# Patient Record
Sex: Male | Born: 1955 | Race: Black or African American | Hispanic: No | Marital: Married | Smoking: Never smoker
Health system: Southern US, Community
[De-identification: ages and names within clinical notes are randomized; demographics above are authoritative.]

## PROBLEM LIST (undated history)

## (undated) DIAGNOSIS — I161 Hypertensive emergency: Secondary | ICD-10-CM

## (undated) DIAGNOSIS — I10 Essential (primary) hypertension: Secondary | ICD-10-CM

## (undated) HISTORY — PX: TONSILLECTOMY: SUR1361

---

## 1999-04-27 ENCOUNTER — Encounter: Admission: RE | Admit: 1999-04-27 | Discharge: 1999-04-27 | Payer: Self-pay | Admitting: Internal Medicine

## 1999-04-27 ENCOUNTER — Encounter: Payer: Self-pay | Admitting: Internal Medicine

## 1999-06-09 ENCOUNTER — Encounter: Payer: Self-pay | Admitting: *Deleted

## 1999-06-09 ENCOUNTER — Ambulatory Visit (HOSPITAL_COMMUNITY): Admission: RE | Admit: 1999-06-09 | Discharge: 1999-06-09 | Payer: Self-pay | Admitting: *Deleted

## 1999-09-12 ENCOUNTER — Encounter: Admission: RE | Admit: 1999-09-12 | Discharge: 1999-12-11 | Payer: Self-pay | Admitting: Internal Medicine

## 2000-06-27 ENCOUNTER — Emergency Department (HOSPITAL_COMMUNITY): Admission: EM | Admit: 2000-06-27 | Discharge: 2000-06-28 | Payer: Self-pay | Admitting: Emergency Medicine

## 2000-06-28 ENCOUNTER — Encounter: Payer: Self-pay | Admitting: Emergency Medicine

## 2000-07-02 ENCOUNTER — Encounter: Admission: RE | Admit: 2000-07-02 | Discharge: 2000-09-30 | Payer: Self-pay | Admitting: Internal Medicine

## 2005-01-06 ENCOUNTER — Emergency Department (HOSPITAL_COMMUNITY): Admission: EM | Admit: 2005-01-06 | Discharge: 2005-01-06 | Payer: Self-pay | Admitting: Emergency Medicine

## 2007-11-07 ENCOUNTER — Emergency Department (HOSPITAL_COMMUNITY): Admission: EM | Admit: 2007-11-07 | Discharge: 2007-11-07 | Payer: Self-pay | Admitting: Emergency Medicine

## 2008-04-02 ENCOUNTER — Encounter: Payer: Self-pay | Admitting: Internal Medicine

## 2008-04-03 ENCOUNTER — Ambulatory Visit (HOSPITAL_COMMUNITY): Admission: RE | Admit: 2008-04-03 | Discharge: 2008-04-03 | Payer: Self-pay | Admitting: Internal Medicine

## 2009-05-22 ENCOUNTER — Emergency Department (HOSPITAL_COMMUNITY): Admission: EM | Admit: 2009-05-22 | Discharge: 2009-05-22 | Payer: Self-pay | Admitting: Emergency Medicine

## 2011-08-22 ENCOUNTER — Other Ambulatory Visit: Payer: Self-pay | Admitting: Family Medicine

## 2011-08-22 DIAGNOSIS — I639 Cerebral infarction, unspecified: Secondary | ICD-10-CM

## 2012-11-22 ENCOUNTER — Encounter (HOSPITAL_COMMUNITY): Payer: Self-pay | Admitting: Emergency Medicine

## 2012-11-22 ENCOUNTER — Emergency Department (INDEPENDENT_AMBULATORY_CARE_PROVIDER_SITE_OTHER)
Admission: EM | Admit: 2012-11-22 | Discharge: 2012-11-22 | Disposition: A | Discharge status: Home / Self Care | Payer: Self-pay | Source: Home / Self Care | Attending: Emergency Medicine | Admitting: Emergency Medicine

## 2012-11-22 DIAGNOSIS — T148XXA Other injury of unspecified body region, initial encounter: Secondary | ICD-10-CM

## 2012-11-22 HISTORY — DX: Essential (primary) hypertension: I10

## 2012-11-22 MED ORDER — CYCLOBENZAPRINE HCL 5 MG PO TABS: 5.0000 mg | ORAL_TABLET | Freq: Three times a day (TID) | ORAL | Status: DC | PRN

## 2012-11-22 MED ORDER — IBUPROFEN 800 MG PO TABS
800.0000 mg | ORAL_TABLET | Freq: Once | ORAL | Status: AC
  Administered 2012-11-22: 800 mg via ORAL

## 2012-11-22 MED ORDER — IBUPROFEN 600 MG PO TABS: 600.0000 mg | ORAL_TABLET | Freq: Three times a day (TID) | ORAL | Status: DC | PRN

## 2012-11-22 MED ORDER — IBUPROFEN 800 MG PO TABS
ORAL_TABLET | ORAL | Status: AC
  Filled 2012-11-22: qty 1

## 2012-11-22 NOTE — ED Provider Notes (Signed)
Medical screening examination/treatment/procedure(s) were performed by non-physician practitioner and as supervising physician I was immediately available for consultation/collaboration.  Hawraa Stambaugh, M.D.  Vandy Fong C Kinzie Wickes, MD 11/22/12 2210 
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
## 2013-05-10 ENCOUNTER — Emergency Department (HOSPITAL_COMMUNITY)
Admission: EM | Admit: 2013-05-10 | Discharge: 2013-05-10 | Disposition: A | Discharge status: Nursing Home (Includes ICF) | Payer: Medicaid Other | Source: Home / Self Care | Attending: Family Medicine | Admitting: Family Medicine

## 2013-05-10 ENCOUNTER — Emergency Department (HOSPITAL_COMMUNITY)
Admission: EM | Admit: 2013-05-10 | Discharge: 2013-05-10 | Disposition: A | Discharge status: Home / Self Care | Payer: Medicaid Other | Attending: Emergency Medicine | Admitting: Emergency Medicine

## 2013-05-10 ENCOUNTER — Encounter (HOSPITAL_COMMUNITY): Payer: Self-pay | Admitting: Emergency Medicine

## 2013-05-10 ENCOUNTER — Emergency Department (HOSPITAL_COMMUNITY): Payer: Medicaid Other

## 2013-05-10 DIAGNOSIS — M25559 Pain in unspecified hip: Secondary | ICD-10-CM | POA: Insufficient documentation

## 2013-05-10 DIAGNOSIS — N179 Acute kidney failure, unspecified: Secondary | ICD-10-CM

## 2013-05-10 DIAGNOSIS — IMO0001 Reserved for inherently not codable concepts without codable children: Secondary | ICD-10-CM | POA: Insufficient documentation

## 2013-05-10 DIAGNOSIS — Z79899 Other long term (current) drug therapy: Secondary | ICD-10-CM | POA: Insufficient documentation

## 2013-05-10 DIAGNOSIS — M25569 Pain in unspecified knee: Secondary | ICD-10-CM | POA: Insufficient documentation

## 2013-05-10 DIAGNOSIS — I10 Essential (primary) hypertension: Secondary | ICD-10-CM | POA: Insufficient documentation

## 2013-05-10 DIAGNOSIS — N289 Disorder of kidney and ureter, unspecified: Secondary | ICD-10-CM | POA: Insufficient documentation

## 2013-05-10 DIAGNOSIS — M609 Myositis, unspecified: Secondary | ICD-10-CM

## 2013-05-10 DIAGNOSIS — R51 Headache: Secondary | ICD-10-CM | POA: Insufficient documentation

## 2013-05-10 LAB — BASIC METABOLIC PANEL
Creatinine, Ser: 1.68 mg/dL — ABNORMAL HIGH (ref 0.50–1.35)
GFR calc Af Amer: 51 mL/min — ABNORMAL LOW (ref >90)
GFR calc non Af Amer: 44 mL/min — ABNORMAL LOW (ref >90)
Potassium: 4 mEq/L (ref 3.5–5.1)
Sodium: 132 mEq/L — ABNORMAL LOW (ref 135–145)

## 2013-05-10 LAB — SEDIMENTATION RATE: Sed Rate: 8 mm/hr (ref 0–16)

## 2013-05-10 LAB — CBC
Hemoglobin: 16 g/dL (ref 13.0–17.0)
MCV: 86.2 fL (ref 78.0–100.0)
RBC: 5.35 MIL/uL (ref 4.22–5.81)
WBC: 7.4 10*3/uL (ref 4.0–10.5)

## 2013-05-10 LAB — URINALYSIS, ROUTINE W REFLEX MICROSCOPIC
Nitrite: NEGATIVE
Specific Gravity, Urine: 1.022 (ref 1.005–1.030)
Urobilinogen, UA: 1 mg/dL (ref 0.0–1.0)

## 2013-05-10 LAB — CK: Total CK: 301 U/L — ABNORMAL HIGH (ref 7–232)

## 2013-05-10 MED ORDER — MORPHINE SULFATE 4 MG/ML IJ SOLN
4.0000 mg | Freq: Once | INTRAMUSCULAR | Status: AC
  Administered 2013-05-10: 4 mg via INTRAVENOUS
  Filled 2013-05-10: qty 1

## 2013-05-10 MED ORDER — ACETAMINOPHEN 325 MG PO TABS: 650.0000 mg | ORAL_TABLET | Freq: Once | ORAL | Status: DC

## 2013-05-10 MED ORDER — SODIUM CHLORIDE 0.9 % IV BOLUS (SEPSIS)
1000.0000 mL | Freq: Once | INTRAVENOUS | Status: AC
  Administered 2013-05-10: 1000 mL via INTRAVENOUS

## 2013-05-10 NOTE — ED Notes (Signed)
Pt. Stated, I've had a headache and joint pain for 2 weeks.

## 2013-05-10 NOTE — ED Notes (Signed)
Pt c/o HA and BA onset 2 weeks HA increases at night and when he lays down... Hx of HTN Takes BP meds daily Denies: f/v/n/d, cold sxs, SOB, wheezing, CP Alert w/no signs of acute distress.

## 2013-05-10 NOTE — ED Provider Notes (Signed)
Medical screening examination/treatment/procedure(s) were conducted as a shared visit with non-physician practitioner(s) and myself.  I personally evaluated the patient during the encounter.  2 week history of headache that is gradual in onset, radiates to back of head only when lying down.  No thunderclap onset, no fever. No vision change. Compliance with BP meds.  CN 2-12 intact, no ataxia on finger to nose, no nystagmus, 5/5 strength throughout, no pronator drift, Romberg negative, normal gait. No temporal artery tenderness, no meningismus. Doubt SAH, meningitis or temporal arteritis.  EKG Interpretation   None        Glynn Octave, MD 05/10/13 1747

## 2013-05-10 NOTE — ED Provider Notes (Signed)
Craig Hart is a 57 y.o. male who presents to Urgent Care today for bitemporal headache and muscle aches for the last 2 weeks. Patient has a medical history significant for headaches however this one is a bit different. He denies any injury weakness or numbness. He denies any discoordination. He notes headache is worse at night when laying down. He has not tried any medications yet. He denies any fevers or chills nausea vomiting or diarrhea. He has not seen his doctor yet. He denies any significant blurry vision.   Past Medical History  Diagnosis Date  . Hypertension    History  Substance Use Topics  . Smoking status: Never Smoker   . Smokeless tobacco: Not on file  . Alcohol Use: No   ROS as above Medications reviewed. No current facility-administered medications for this encounter.   Current Outpatient Prescriptions  Medication Sig Dispense Refill  . hydrochlorothiazide (HYDRODIURIL) 12.5 MG tablet Take 12.5 mg by mouth daily.      [name] lisinopril (PRINIVIL,ZESTRIL) 20 MG tablet Take 20 mg by mouth daily.      . cyclobenzaprine (FLEXERIL) 5 MG tablet Take 1 tablet (5 mg total) by mouth 3 (three) times daily as needed for muscle spasms.  21 tablet  0  . ibuprofen (ADVIL,MOTRIN) 600 MG tablet Take 1 tablet (600 mg total) by mouth every 8 (eight) hours as needed for pain.  30 tablet  0  . OVER THE COUNTER MEDICATION Reports he takes prescription blood pressure medicine.  Cannot remember the name        Exam:  BP 129/97  Pulse 83  Temp(Src) 98.8 F (37.1 C) (Oral)  Resp 16  SpO2 100% Gen: Well NAD HEENT: EOMI,  MMM, PERRLA. Funduscopic exam is normal bilateral.  Lungs: Normal work of breathing. CTABL Heart: RRR no MRG Abd: NABS, Soft. NT, ND Exts: Non edematous BL  LE, warm and well perfused.  Neuro: Alert and oriented. Cranial nerves II through XII are intact. Normal balance coordination sensation and strength. Normal gait.   Results for orders placed during the hospital  encounter of 05/10/13 (from the past 24 hour(s))  BASIC METABOLIC PANEL     Status: Abnormal   Collection Time    05/10/13 10:26 AM      Result Value Range   Sodium 132 (*) 135 - 145 mEq/L   Potassium 4.0  3.5 - 5.1 mEq/L   Chloride 92 (*) 96 - 112 mEq/L   CO2 29  19 - 32 mEq/L   Glucose, Bld 99  70 - 99 mg/dL   BUN 17  6 - 23 mg/dL   Creatinine, Ser 1.61 (*) 0.50 - 1.35 mg/dL   Calcium 9.7  8.4 - 09.6 mg/dL   GFR calc non Af Amer 44 (*) >90 mL/min   GFR calc Af Amer 51 (*) >90 mL/min  CBC     Status: None   Collection Time    05/10/13 10:26 AM      Result Value Range   WBC 7.4  4.0 - 10.5 K/uL   RBC 5.35  4.22 - 5.81 MIL/uL   Hemoglobin 16.0  13.0 - 17.0 g/dL   HCT 04.5  40.9 - 81.1 %   MCV 86.2  78.0 - 100.0 fL   MCH 29.9  26.0 - 34.0 pg   MCHC 34.7  30.0 - 36.0 g/dL   RDW 91.4  78.2 - 95.6 %   Platelets 174  150 - 400 K/uL  CK  Status: Abnormal   Collection Time    05/10/13 10:26 AM      Result Value Range   Total CK 301 (*) 7 - 232 U/L   No results found.  Assessment and Plan: 57 y.o. male with  1) acute on chronic kidney disease. Patient last had a creatinine measurement of 1.2 in November of 2013 at [place]. At this time his GFR was 72. Currently it is 50. I'm not sure if this is a new change or progression of his underlying kidney dysfunction.  Regardless I feel that he needs more evaluation and management. His blood pressure medications also may need changing.  2) elevated CK: Associated with body aches. I'm not sure this is related to his underlying kidney dysfunction. 3) ESR pending: Concern for possible temporal arteritis. This lab is pending. Followup in the emergency room.       Rodolph Bong, MD 05/10/13 1213

## 2013-05-10 NOTE — ED Notes (Signed)
Patient transported to CT 

## 2013-05-10 NOTE — ED Notes (Signed)
Pt c/o HA X 2 weeks, reports its worse at night and he hasn't been able to sleep much because of it. Denies taking any pain meds to elevate it at home, reports he is afraid they will interact with his BP meds. Pt also c/o bilateral hip and knee join pain that started 2 weeks, reports this achy pain comes and goes. No slurred speech. No vision changes. No issues walking. Equal hand grips. Pt in nad, skin warm and dry, resp e/u. Pt reports sometimes he is nauseous, denies vomiting.

## 2013-05-10 NOTE — ED Notes (Signed)
Acuity changed to 3 per order fast track PA.

## 2013-05-10 NOTE — ED Provider Notes (Signed)
CSN: [id number]     Arrival date & time 05/10/13  1227 History   First MD Initiated Contact with Patient 05/10/13 1312     Chief Complaint  Patient presents with  . Headache  . Joint Pain   (Consider location/radiation/quality/duration/timing/severity/associated sxs/prior Treatment) HPI  57 year old male history of hypertension who was sent from urgent care to the ER for evaluation of headache and muscle aches. Patient reports gradual onset of headache ongoing for about 2 weeks. Headache is described as a sharp sensation to both temporal region and usually radiates to the back of the head whenever patient lies down at night. Headache is worse at night with laying down and improves when he gets up and walk. The intensity is waxing and waning, currently his headache as a 3/10. He also endorsed having bilateral hip and knee pain. Pain is described as an achy sensation, mild, worse when he lays stills and improves with walking around. Report minimal pain at this time. Aside from taking his usual blood pressure medication he has not try any other treatment. No complaints of photophobia or phonophobia. No neck stiffness. No problem with speech or memory. Patient denies fever, chills, no vision, neck stiffness, nausea, vomiting, diarrhea, chest pain, shortness of breath, abdominal pain, or rash. Did reports having a rash on his back 6 months ago. He denies any medication changes. No significant history of headache. No recent travel. He was seen at urgent care for all his complaints. He was found to have elevated creatinine level of 1.69 and was sent to the ER for further evaluation. No baseline creatinine to compared.  Past Medical History  Diagnosis Date  . Hypertension    No past surgical history on file. No family history on file. History  Substance Use Topics  . Smoking status: Never Smoker   . Smokeless tobacco: Not on file  . Alcohol Use: No    Review of Systems  All other systems reviewed  and are negative.    Allergies  Review of patient's allergies indicates no known allergies.  Home Medications   Current Outpatient Rx  Name  Route  Sig  Dispense  Refill  . cyclobenzaprine (FLEXERIL) 5 MG tablet   Oral   Take 1 tablet (5 mg total) by mouth 3 (three) times daily as needed for muscle spasms.   21 tablet   0   . hydrochlorothiazide (HYDRODIURIL) 12.5 MG tablet   Oral   Take 12.5 mg by mouth daily.         [name] ibuprofen (ADVIL,MOTRIN) 600 MG tablet   Oral   Take 1 tablet (600 mg total) by mouth every 8 (eight) hours as needed for pain.   30 tablet   0   . lisinopril (PRINIVIL,ZESTRIL) 20 MG tablet   Oral   Take 20 mg by mouth daily.         [name] OVER THE COUNTER MEDICATION      Reports he takes prescription blood pressure medicine.  Cannot remember the name          BP 142/87  Pulse 81  Temp(Src) 98.4 F (36.9 C) (Oral)  Resp 20  SpO2 98% Physical Exam  Nursing note and vitals reviewed. Constitutional: He is oriented to person, place, and time. He appears well-developed and well-nourished. No distress.  Awake, alert, nontoxic appearance  HENT:  Head: Normocephalic and atraumatic.  Right Ear: External ear normal.  Left Ear: External ear normal.  Nose: Nose normal.  Mouth/Throat: Oropharynx is clear  and moist.  No temporal bruit  No rash or erythema. No sinus tenderness.  Eyes: Conjunctivae and EOM are normal. Pupils are equal, round, and reactive to light. Right eye exhibits no discharge. Left eye exhibits no discharge.  Neck: Normal range of motion. Neck supple.  No carotid bruit  No nuchal rigidity  Cardiovascular: Normal rate and regular rhythm.   Pulmonary/Chest: Effort normal. No respiratory distress. He exhibits no tenderness.  Abdominal: Soft. There is no tenderness. There is no rebound.  Musculoskeletal: He exhibits no tenderness (No pressure or joint tenderness to bilateral hips or bilateral knee, normal range of motion to all major  joints.).  ROM appears intact, no obvious focal weakness  Neurological: He is alert and oriented to person, place, and time.  Speech clear, pupils equal round reactive to light, extraocular movements intact   Normal peripheral visual fields Cranial nerves III through XII normal including no facial droop Cranial Nerves:  II: Visual fields grossly normal with blinking noted to bilateral confrontation, pupils equal, round, reactive to light and accommodation  III,IV, VI: ptosis not present, extra-ocular motions intact bilaterally  V,VII: smile symmetric  VIII: hearing normal bilaterally  IX,X: gag reflex present  XI: bilateral shoulder shrug  XII: midline tongue extension Follows commands, moves all extremities x4, normal strength to bilateral upper and lower extremities at all major muscle groups including grip Sensation normal to light touch and pinprick Coordination intact, no limb ataxia, finger-nose-finger normal Rapid alternating movements normal No pronator drift Gait normal   Skin: Skin is warm and dry. No rash noted.  Psychiatric: He has a normal mood and affect.    ED Course  Procedures (including critical care time)  Patient here with headache ongoing for 2 weeks. No acute sudden onset thunderclap headache concerning for subarachnoid hemorrhage. No fever or meningismal sign concerning for meningitis. No focal neuro deficit concerning for mass or stroke. His headache is currently a 3/10. He does have pain with or hip pain bilateral knee which is unremarkable on exam. Patient has no appreciable temporal bruit to suggest temporal arteritis at this time.  Sedimentation rate unremarkable. Low suspicion for temporal arteritis and polymyalgia rheumatica.  Doubt disseminated gonococcal meningitis. Labs are otherwise unremarkable except renal function is compromised with a creatinine of 1.69 with no baseline for comparison. Plan to give IV fluid, pain medication, and we'll continue to  monitor. Care discussed with attending.  3:26 PM Head Ct unremarkable.  UA negative.  Pt felt much better.  IVF received.  Stable for discharge.  Resources given, return precaution given.     Labs Review Labs Reviewed  URINALYSIS, ROUTINE W REFLEX MICROSCOPIC   Sodium 135 - 145 mEq/L  132 (L)   Potassium 3.5 - 5.1 mEq/L  4.0   Chloride 96 - 112 mEq/L  92 (L)   CO2 19 - 32 mEq/L  29   Glucose, Bld 70 - 99 mg/dL  99   BUN 6 - 23 mg/dL  17   Creatinine, Ser 4.78 - 1.35 mg/dL  2.95 (H)   Calcium 8.4 - 10.5 mg/dL  9.7   GFR calc non Af Amer >90 mL/min  44 (L)   GFR calc Af Amer >90 mL/min  51 (L)   Comments: (NOTE)    Range 10:26 AM   WBC 4.0 - 10.5 K/uL 7.4    RBC 4.22 - 5.81 MIL/uL 5.35    Hemoglobin 13.0 - 17.0 g/dL 62.1    HCT 30.8 - 65.7 % 46.1  MCV 78.0 - 100.0 fL 86.2    MCH 26.0 - 34.0 pg 29.9    MCHC 30.0 - 36.0 g/dL 19.1    RDW 47.8 - 29.5 % 12.4    Platelets 150 - 400 K/uL 174    Resulting Agency [facility]    Specimen Collected: 05/10/13 10:26 AM Last Resulted: 05/10/13 11:35 AM      Range 10:26 AM   Total CK 7 - 232 U/L 301 (H)    Resulting Agency [facility]    Specimen Collected: 05/10/13 10:26 AM Last Resulted: 05/10/13 11:45 AM     Range 10:26 AM   Sed Rate 0 - 16 mm/hr 8    Resulting Agency [facility]    Specimen Collected: 05/10/13 10:26 AM Last Resulted: 05/10/13 12:29 PM    Imaging Review Ct Head Wo Contrast  05/10/2013   CLINICAL DATA:  Headache, hypertension.  EXAM: CT HEAD WITHOUT CONTRAST  TECHNIQUE: Contiguous axial images were obtained from the base of the skull through the vertex without intravenous contrast.  COMPARISON:  None.  FINDINGS: No acute intracranial abnormality. Specifically, no hemorrhage, hydrocephalus, mass lesion, acute infarction, or significant intracranial injury. No acute calvarial abnormality. Visualized paranasal sinuses and mastoids clear. Orbital soft tissues unremarkable.  IMPRESSION: Normal study.    Electronically Signed   By: Charlett Nose M.D.   On: 05/10/2013 15:02    EKG Interpretation   None       MDM   1. Headache, temporal   2. Renal insufficiency    BP 122/73  Pulse 73  Temp(Src) 98.4 F (36.9 C) (Oral)  Resp 20  SpO2 100%  I have reviewed nursing notes and vital signs. I personally reviewed the imaging tests through PACS system  I reviewed available ER/hospitalization records thought the EMR     Fayrene Helper, [place] 05/10/13 1527

## 2013-09-22 ENCOUNTER — Encounter (HOSPITAL_COMMUNITY): Payer: Self-pay | Admitting: Emergency Medicine

## 2013-09-22 ENCOUNTER — Emergency Department (HOSPITAL_COMMUNITY)
Admission: EM | Admit: 2013-09-22 | Discharge: 2013-09-22 | Disposition: A | Discharge status: Home / Self Care | Payer: Medicaid Other | Source: Home / Self Care | Attending: Emergency Medicine | Admitting: Emergency Medicine

## 2013-09-22 DIAGNOSIS — R109 Unspecified abdominal pain: Secondary | ICD-10-CM

## 2013-09-22 DIAGNOSIS — R197 Diarrhea, unspecified: Secondary | ICD-10-CM

## 2013-09-22 DIAGNOSIS — R110 Nausea: Secondary | ICD-10-CM

## 2013-09-22 MED ORDER — ONDANSETRON 8 MG PO TBDP: 8.0000 mg | ORAL_TABLET | Freq: Three times a day (TID) | ORAL | Status: DC | PRN

## 2013-09-22 NOTE — ED Provider Notes (Signed)
CSN: [id number]     Arrival date & time 09/22/13  1048 History   First MD Initiated Contact with Patient 09/22/13 1232     Chief Complaint  Patient presents with  . Abdominal Cramping   (Consider location/radiation/quality/duration/timing/severity/associated sxs/prior Treatment) HPI Comments: 58 year old male presents complaining of abdominal cramping, diarrhea, nausea without vomiting, headaches. This started one week ago a few hours after he ate part of a sandwich that had gone bad. He was in the middle of eating the sandwich when he noticed that there was something black in it. He stopped eating it and saved it for possible analysis in case he got sick. That night, he developed fairly severe abdominal cramping. His abdominal cramping subsided slightly the next day but has been persistent and then up until now, 5 days out. He has had persistent nausea for 5 days also but no actual vomiting. He denies fever, chills, bloody diarrhea, recent travel, sick contacts. No recent antibiotic use.  Patient is a 58 y.o. male presenting with cramps.  Abdominal Cramping Associated symptoms include abdominal pain and headaches.    Past Medical History  Diagnosis Date  . Hypertension    History reviewed. No pertinent past surgical history. History reviewed. No pertinent family history. History  Substance Use Topics  . Smoking status: Never Smoker   . Smokeless tobacco: Not on file  . Alcohol Use: No    Review of Systems  Constitutional: Negative for fever and chills.  Gastrointestinal: Positive for nausea, abdominal pain and diarrhea. Negative for vomiting and blood in stool.  Neurological: Positive for headaches.  All other systems reviewed and are negative.    Allergies  Review of patient's allergies indicates no known allergies.  Home Medications   Current Outpatient Rx  Name  Route  Sig  Dispense  Refill  . hydrochlorothiazide (HYDRODIURIL) 12.5 MG tablet   Oral   Take 12.5 mg by  mouth daily.         [name] lisinopril (PRINIVIL,ZESTRIL) 20 MG tablet   Oral   Take 20 mg by mouth daily.         [name] ibuprofen (ADVIL,MOTRIN) 600 MG tablet   Oral   Take 1 tablet (600 mg total) by mouth every 8 (eight) hours as needed for pain.   30 tablet   0   . ondansetron (ZOFRAN ODT) 8 MG disintegrating tablet   Oral   Take 1 tablet (8 mg total) by mouth every 8 (eight) hours as needed for nausea or vomiting.   12 tablet   0    BP 172/109  Pulse 65  Temp(Src) 98.5 F (36.9 C) (Oral)  Resp 16  SpO2 100% Physical Exam  Nursing note and vitals reviewed. Constitutional: He is oriented to person, place, and time. He appears well-developed and well-nourished. No distress.  HENT:  Head: Normocephalic.  Cardiovascular: Normal rate, regular rhythm and normal heart sounds.  Exam reveals no gallop and no friction rub.   No murmur heard. Pulmonary/Chest: Effort normal and breath sounds normal. No respiratory distress. He has no wheezes. He has no rales.  Abdominal: Soft. Bowel sounds are normal. He exhibits no distension and no mass. There is tenderness (very minimal) in the periumbilical area. There is no rigidity, no rebound, no guarding, no tenderness at McBurney's point and negative Murphy's sign.  Neurological: He is alert and oriented to person, place, and time. Coordination normal.  Skin: Skin is warm and dry. No rash noted. He is not diaphoretic.  Psychiatric: He  has a normal mood and affect. Judgment normal.    ED Course  Procedures (including critical care time) Labs Review Labs Reviewed - No data to display Imaging Review No results found.   MDM   1. Abdominal cramping   2. Diarrhea   3. Nausea    Afebrile, nontoxic. Abdomen is soft and only minimally tender. Would like to send a stool culture, he is unable to provide one at this time so he will bring it back. Treating with Zofran when necessary for nausea, increase fluid intake.   Meds ordered this  encounter  Medications  . ondansetron (ZOFRAN ODT) 8 MG disintegrating tablet    Sig: Take 1 tablet (8 mg total) by mouth every 8 (eight) hours as needed for nausea or vomiting.    Dispense:  12 tablet    Refill:  0    Order Specific Question:  Supervising Provider    Answer:  Lorenz CoasterKELLER, DAVID C [[id number]]       Graylon GoodZachary H Cherl Gorney, PA-C 09/22/13 1256

## 2013-09-22 NOTE — ED Provider Notes (Signed)
Medical screening examination/treatment/procedure(s) were performed by non-physician practitioner and as supervising physician I was immediately available for consultation/collaboration.  Kilan Banfill, M.D.  Darriel Sinquefield C Shamela Haydon, MD 09/22/13 1605 

## 2013-09-22 NOTE — ED Notes (Signed)
C/o abdominal cramping with diarrhea.  Headaches.   Pt has tried pepto with no relief.  Mild nausea but no vomiting.  States "pain is in center of abdomin and radiates to left sided off/on".   Denies fever.

## 2013-09-27 LAB — STOOL CULTURE

## 2014-09-18 ENCOUNTER — Encounter (HOSPITAL_COMMUNITY): Payer: Self-pay | Admitting: Family Medicine

## 2014-09-18 ENCOUNTER — Emergency Department (INDEPENDENT_AMBULATORY_CARE_PROVIDER_SITE_OTHER)
Admission: EM | Admit: 2014-09-18 | Discharge: 2014-09-18 | Disposition: A | Discharge status: Home / Self Care | Payer: Self-pay | Source: Home / Self Care | Attending: Family Medicine | Admitting: Family Medicine

## 2014-09-18 DIAGNOSIS — Z77098 Contact with and (suspected) exposure to other hazardous, chiefly nonmedicinal, chemicals: Secondary | ICD-10-CM

## 2014-09-18 DIAGNOSIS — I10 Essential (primary) hypertension: Secondary | ICD-10-CM

## 2014-09-18 MED ORDER — SILVER SULFADIAZINE 1 % EX CREA: 1.0000 "application " | TOPICAL_CREAM | Freq: Every day | CUTANEOUS | Status: DC

## 2014-09-18 MED ORDER — SILVER SULFADIAZINE 1 % EX CREA
TOPICAL_CREAM | CUTANEOUS | Status: AC
  Filled 2014-09-18: qty 85

## 2014-09-18 NOTE — ED Notes (Signed)
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 Washed hands w soap and water, but has a painful area on hand that is burning. No skin injury evident on inspection by this Clinical research associatewriter

## 2014-09-18 NOTE — Discharge Instructions (Signed)
The chemical exposure to your skin does not to appear have caused any lasting injury. Please apply cerave or silvadene creams to the area daily as needed for relief. Please also take benadryl to help with skin irritation and start Ibuprofen 600mg  every 6 hours for anti-inflammation. If you develop a rash please let us know as you will likely need additional medications.
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
## 2014-09-21 ENCOUNTER — Emergency Department (HOSPITAL_COMMUNITY): Payer: Medicaid Other

## 2014-09-21 ENCOUNTER — Encounter (HOSPITAL_COMMUNITY)
Admission: EM | Disposition: A | Discharge status: Home / Self Care | Payer: Self-pay | Source: Home / Self Care | Attending: Cardiovascular Disease

## 2014-09-21 ENCOUNTER — Encounter (HOSPITAL_COMMUNITY): Payer: Self-pay | Admitting: Emergency Medicine

## 2014-09-21 ENCOUNTER — Other Ambulatory Visit (HOSPITAL_COMMUNITY): Payer: Self-pay

## 2014-09-21 ENCOUNTER — Inpatient Hospital Stay (HOSPITAL_COMMUNITY)
Admission: EM | Admit: 2014-09-21 | Discharge: 2014-09-23 | DRG: 287 | Disposition: A | Discharge status: Home / Self Care | Payer: Medicaid Other | Attending: Cardiovascular Disease | Admitting: Cardiovascular Disease

## 2014-09-21 ENCOUNTER — Inpatient Hospital Stay (HOSPITAL_COMMUNITY): Payer: Medicaid Other

## 2014-09-21 DIAGNOSIS — E876 Hypokalemia: Secondary | ICD-10-CM | POA: Diagnosis present

## 2014-09-21 DIAGNOSIS — I25110 Atherosclerotic heart disease of native coronary artery with unstable angina pectoris: Secondary | ICD-10-CM

## 2014-09-21 DIAGNOSIS — Z791 Long term (current) use of non-steroidal anti-inflammatories (NSAID): Secondary | ICD-10-CM

## 2014-09-21 DIAGNOSIS — I119 Hypertensive heart disease without heart failure: Secondary | ICD-10-CM | POA: Diagnosis present

## 2014-09-21 DIAGNOSIS — Z8249 Family history of ischemic heart disease and other diseases of the circulatory system: Secondary | ICD-10-CM | POA: Diagnosis not present

## 2014-09-21 DIAGNOSIS — R079 Chest pain, unspecified: Secondary | ICD-10-CM | POA: Diagnosis present

## 2014-09-21 DIAGNOSIS — E785 Hyperlipidemia, unspecified: Secondary | ICD-10-CM | POA: Diagnosis present

## 2014-09-21 DIAGNOSIS — I2510 Atherosclerotic heart disease of native coronary artery without angina pectoris: Secondary | ICD-10-CM | POA: Diagnosis present

## 2014-09-21 DIAGNOSIS — I161 Hypertensive emergency: Secondary | ICD-10-CM

## 2014-09-21 DIAGNOSIS — Z79899 Other long term (current) drug therapy: Secondary | ICD-10-CM | POA: Diagnosis not present

## 2014-09-21 DIAGNOSIS — I213 ST elevation (STEMI) myocardial infarction of unspecified site: Secondary | ICD-10-CM

## 2014-09-21 DIAGNOSIS — I10 Essential (primary) hypertension: Secondary | ICD-10-CM | POA: Diagnosis present

## 2014-09-21 DIAGNOSIS — R9431 Abnormal electrocardiogram [ECG] [EKG]: Secondary | ICD-10-CM | POA: Diagnosis present

## 2014-09-21 DIAGNOSIS — I200 Unstable angina: Secondary | ICD-10-CM

## 2014-09-21 HISTORY — DX: Hypertensive emergency: I16.1

## 2014-09-21 HISTORY — PX: LEFT HEART CATHETERIZATION WITH CORONARY ANGIOGRAM: SHX5451

## 2014-09-21 LAB — CBC
HCT: 44.9 % (ref 39.0–52.0)
HCT: 38.7 % — ABNORMAL LOW (ref 39.0–52.0)
HEMOGLOBIN: 15.3 g/dL (ref 13.0–17.0)
Hemoglobin: 13.1 g/dL (ref 13.0–17.0)
MCH: 30.1 pg (ref 26.0–34.0)
MCH: 29 pg (ref 26.0–34.0)
MCHC: 34.1 g/dL (ref 30.0–36.0)
MCHC: 33.9 g/dL (ref 30.0–36.0)
MCV: 88.2 fL (ref 78.0–100.0)
MCV: 85.8 fL (ref 78.0–100.0)
Platelets: 181 10*3/uL (ref 150–400)
Platelets: 187 10*3/uL (ref 150–400)
RBC: 5.09 MIL/uL (ref 4.22–5.81)
RBC: 4.51 MIL/uL (ref 4.22–5.81)
RDW: 12.4 % (ref 11.5–15.5)
RDW: 12.4 % (ref 11.5–15.5)
WBC: 7.2 10*3/uL (ref 4.0–10.5)
WBC: 7.4 10*3/uL (ref 4.0–10.5)

## 2014-09-21 LAB — BASIC METABOLIC PANEL
Anion gap: 9 (ref 5–15)
BUN: 17 mg/dL (ref 6–23)
CHLORIDE: 101 mmol/L (ref 96–112)
CO2: 29 mmol/L (ref 19–32)
CREATININE: 1.2 mg/dL (ref 0.50–1.35)
Calcium: 9.3 mg/dL (ref 8.4–10.5)
GFR calc Af Amer: 76 mL/min — ABNORMAL LOW (ref >90)
GFR calc non Af Amer: 65 mL/min — ABNORMAL LOW (ref >90)
GLUCOSE: 100 mg/dL — AB (ref 70–99)
Potassium: 3.8 mmol/L (ref 3.5–5.1)
Sodium: 139 mmol/L (ref 135–145)

## 2014-09-21 LAB — HEPATIC FUNCTION PANEL
ALBUMIN: 4.5 g/dL (ref 3.5–5.2)
ALT: 21 U/L (ref 0–53)
AST: 27 U/L (ref 0–37)
Alkaline Phosphatase: 81 U/L (ref 39–117)
BILIRUBIN DIRECT: 0.1 mg/dL (ref 0.0–0.5)
Indirect Bilirubin: 0.8 mg/dL (ref 0.3–0.9)
Total Bilirubin: 0.9 mg/dL (ref 0.3–1.2)
Total Protein: 8.5 g/dL — ABNORMAL HIGH (ref 6.0–8.3)

## 2014-09-21 LAB — APTT: APTT: 29 s (ref 24–37)

## 2014-09-21 LAB — CREATININE, SERUM
CREATININE: 1.16 mg/dL (ref 0.50–1.35)
GFR calc Af Amer: 79 mL/min — ABNORMAL LOW (ref >90)
GFR, EST NON AFRICAN AMERICAN: 68 mL/min — AB (ref >90)

## 2014-09-21 LAB — LIPASE, BLOOD: Lipase: 24 U/L (ref 11–59)

## 2014-09-21 LAB — TROPONIN I
Troponin I: <0.03 ng/mL (ref <0.031)
Troponin I: <0.03 ng/mL (ref <0.031)

## 2014-09-21 LAB — PROTIME-INR
INR: 1.03 (ref 0.00–1.49)
PROTHROMBIN TIME: 13.6 s (ref 11.6–15.2)

## 2014-09-21 LAB — I-STAT TROPONIN, ED: Troponin i, poc: 0 ng/mL (ref 0.00–0.08)

## 2014-09-21 SURGERY — LEFT HEART CATHETERIZATION WITH CORONARY ANGIOGRAM

## 2014-09-21 MED ORDER — SODIUM CHLORIDE 0.9 % IV SOLN: 250.0000 mL | INTRAVENOUS | Status: DC | PRN

## 2014-09-21 MED ORDER — ACETAMINOPHEN 325 MG PO TABS
650.0000 mg | ORAL_TABLET | ORAL | Status: DC | PRN
  Administered 2014-09-21 – 2014-09-22 (×3): 650 mg via ORAL
  Filled 2014-09-21 (×3): qty 2

## 2014-09-21 MED ORDER — HEPARIN SODIUM (PORCINE) 5000 UNIT/ML IJ SOLN
60.0000 [IU]/kg | INTRAMUSCULAR | Status: AC
  Administered 2014-09-21: 5000 [IU] via INTRAVENOUS

## 2014-09-21 MED ORDER — HEPARIN SODIUM (PORCINE) 5000 UNIT/ML IJ SOLN
5000.0000 [IU] | Freq: Three times a day (TID) | INTRAMUSCULAR | Status: DC
  Administered 2014-09-21 – 2014-09-23 (×5): 5000 [IU] via SUBCUTANEOUS
  Filled 2014-09-21 (×3): qty 1

## 2014-09-21 MED ORDER — OXYCODONE-ACETAMINOPHEN 5-325 MG PO TABS: 1.0000 | ORAL_TABLET | ORAL | Status: DC | PRN

## 2014-09-21 MED ORDER — ASPIRIN 81 MG PO CHEW
324.0000 mg | CHEWABLE_TABLET | Freq: Once | ORAL | Status: AC
  Administered 2014-09-21: 324 mg via ORAL

## 2014-09-21 MED ORDER — ASPIRIN 81 MG PO CHEW
CHEWABLE_TABLET | ORAL | Status: AC
Start: 2014-09-21 — End: 2014-09-21
  Administered 2014-09-21: 11:00:00 324 mg via ORAL
  Filled 2014-09-21: qty 4

## 2014-09-21 MED ORDER — ASPIRIN 81 MG PO CHEW
81.0000 mg | CHEWABLE_TABLET | Freq: Every day | ORAL | Status: DC
  Administered 2014-09-22 – 2014-09-23 (×2): 81 mg via ORAL
  Filled 2014-09-21 (×2): qty 1

## 2014-09-21 MED ORDER — LISINOPRIL 20 MG PO TABS
20.0000 mg | ORAL_TABLET | Freq: Every day | ORAL | Status: DC
  Administered 2014-09-21 – 2014-09-22 (×2): 20 mg via ORAL
  Filled 2014-09-21 (×2): qty 1

## 2014-09-21 MED ORDER — SODIUM CHLORIDE 0.9 % IJ SOLN
3.0000 mL | Freq: Two times a day (BID) | INTRAMUSCULAR | Status: DC
  Administered 2014-09-22: 10:00:00 3 mL via INTRAVENOUS

## 2014-09-21 MED ORDER — HYDRALAZINE HCL 20 MG/ML IJ SOLN
10.0000 mg | Freq: Four times a day (QID) | INTRAMUSCULAR | Status: DC | PRN
  Administered 2014-09-22: 13:00:00 10 mg via INTRAVENOUS
  Filled 2014-09-21: qty 1

## 2014-09-21 MED ORDER — SODIUM CHLORIDE 0.9 % IJ SOLN: 3.0000 mL | INTRAMUSCULAR | Status: DC | PRN

## 2014-09-21 MED ORDER — SODIUM CHLORIDE 0.9 % IV SOLN: INTRAVENOUS | Status: DC

## 2014-09-21 MED ORDER — MORPHINE SULFATE 2 MG/ML IJ SOLN
INTRAMUSCULAR | Status: AC
  Filled 2014-09-21: qty 1

## 2014-09-21 MED ORDER — NITROGLYCERIN IN D5W 200-5 MCG/ML-% IV SOLN
10.0000 ug/min | INTRAVENOUS | Status: DC
  Filled 2014-09-21: qty 250

## 2014-09-21 MED ORDER — SODIUM CHLORIDE 0.9 % IV SOLN
1.0000 mL/kg/h | INTRAVENOUS | Status: AC
  Administered 2014-09-21: 13:00:00 1 mL/kg/h via INTRAVENOUS

## 2014-09-21 MED ORDER — NITROGLYCERIN 0.4 MG SL SUBL
SUBLINGUAL_TABLET | SUBLINGUAL | Status: AC
Start: 2014-09-21 — End: 2014-09-21
  Administered 2014-09-21: 0.4 mg
  Filled 2014-09-21: qty 1

## 2014-09-21 MED ORDER — LABETALOL HCL 5 MG/ML IV SOLN: 20.0000 mg | INTRAVENOUS | Status: DC | PRN

## 2014-09-21 MED ORDER — HYDROCHLOROTHIAZIDE 25 MG PO TABS
25.0000 mg | ORAL_TABLET | Freq: Every day | ORAL | Status: DC
  Administered 2014-09-21 – 2014-09-23 (×3): 25 mg via ORAL
  Filled 2014-09-21 (×3): qty 1

## 2014-09-21 MED ORDER — METOPROLOL TARTRATE 1 MG/ML IV SOLN
INTRAVENOUS | Status: AC
  Filled 2014-09-21: qty 5

## 2014-09-21 MED ORDER — ONDANSETRON HCL 4 MG/2ML IJ SOLN: 4.0000 mg | Freq: Four times a day (QID) | INTRAMUSCULAR | Status: DC | PRN

## 2014-09-21 NOTE — Care Management Note (Unsigned)
    Page 1 of 1   09/21/2014     2:49:40 PM CARE MANAGEMENT NOTE 09/21/2014  Patient:  Craig Hart,Craig Hart   Account Number:  [id number]  Date Initiated:  09/21/2014  Documentation initiated by:  Gae GallopOLE,ANGELA  Subjective/Objective Assessment:   PTA from home admitted with HTN urgency.     Action/Plan:   Return to home when medically stable. CM to f/u with d/c disposition.   Anticipated DC Date:     Anticipated DC Plan:        DC Planning Services  CM consult      Choice offered to / List presented to:             Status of service:  In process, will continue to follow Medicare Important Message given?  NO (If response is "NO", the following Medicare IM given date fields will be blank) Date Medicare IM given:   Medicare IM given by:   Date Additional Medicare IM given:   Additional Medicare IM given by:    Discharge Disposition:    Per UR Regulation:  Reviewed for med. necessity/level of care/duration of stay  If discussed at Long Length of Stay Meetings, dates discussed:    Comments:

## 2014-09-21 NOTE — ED Notes (Signed)
Pt c/o chest pain onset this morning while he was opening boxes. Pt states he did take anti-hypertensive this morning. BP is 218/127.

## 2014-09-21 NOTE — Progress Notes (Signed)
Responded to referral from [place] colleague to continue support to patient who reported to [facility] after being transferred from [facility]. Met with patient's mother, father, and son and escorted them to cath lab waiting area.  I informed cath lab staff of the location of patient family. Provided emotional support, listening and ministry of presence.  Will follow as needed.   09/21/14 1100  Clinical Encounter Type  Visited With Patient;Family;Health care provider  Visit Type Follow-up;Spiritual support;Pre-op;ED  Referral From Chaplain;Nurse  Spiritual Encounters  Spiritual Needs Emotional  Stress Factors  Patient Stress Factors None identified  Cristopher Peru, pager [phone]

## 2014-09-21 NOTE — CV Procedure (Signed)
    Cardiac Catheterization Procedure Note  Name: Craig Hart MRN: [id number] DOB: [birth year]  Procedure: Left Heart Cath, Selective Coronary Angiography, LV angiography  Indication: Chest pain and abnormal EKG. Initially, a code STEMI was called as [name] [facility]. He presented with substernal chest pain and EKG was suggestive of an acute anterolateral myocardial infarction. He presented directly to the cardiac catheterization lab for cardiac cath and possible PCI.   Procedural Details: The right wrist was prepped, draped, and anesthetized with 1% lidocaine. Using the modified Seldinger technique, a 5/6 French Slender sheath was introduced into the right radial artery. 3 mg of verapamil was administered through the sheath, weight-based unfractionated heparin was administered intravenously. Standard Judkins catheters were used for selective coronary angiography and left ventriculography. The right subclavian artery had marked tortuosity. A guide catheter had to be used to inject the native RCA. A JR4 6 JamaicaFrench guide catheter was utilized. Catheter exchanges were performed over an exchange length guidewire. There were no immediate procedural complications. A TR band was used for radial hemostasis at the completion of the procedure.  The patient was transferred to the post catheterization recovery area for further monitoring.  Procedural Findings: Hemodynamics: AO 144/83 with a mean of 112 LV 140/22  Coronary angiography: Coronary dominance: right  Left mainstem: Widely patent vessel with no obstructive disease. Divides into the LAD and left circumflex.  Left anterior descending (LAD): The LAD is patent. There are mild irregularities without significant stenoses. The diagonal branches are patent. The LAD wraps around the left ventricular apex.  Left circumflex (LCx): The circumflex is patent. The OM and posterior lateral branches are patent.  There is no obstructive disease present.  Right coronary artery (RCA): Large, dominant vessel. The vessel has diffuse irregularity without significant stenosis. At the junction of the proximal and mid vessel, there is 20-30% stenosis. The PDA and PLA branches are patent without stenosis.  Left ventriculography: Left ventricular systolic function is hyperdynamic, LVEF is estimated at 65-75%, there is no significant mitral regurgitation   Estimated Blood Loss: Minimal  Final Conclusions:   1. Minimal nonobstructive coronary artery disease as detailed above 2. Hyperdynamic LV systolic function 3. Hypertensive heart disease  Recommendations: The patient needs management of his severe hypertension. Will cycle cardiac enzymes. Anticipate hospital discharge next 24-48 hours as long as blood pressure is controlled.  Tonny BollmanMichael Mearl Olver MD, [facility] 09/21/2014, 12:10 PM

## 2014-09-21 NOTE — H&P (Signed)
History and Physical  Patient ID: Craig RedWillie N Hart MRN: [id number], SOB: [birth year] 59 y.o. Date of Encounter: 09/21/2014, 11:28 AM  Primary Physician: No PCP Per Patient Primary Cardiologist: new (will be Excell Seltzerooper)  Chief Complaint: Chest Pain  HPI: 59 y.o. male w/ PMHx significant for hypertension who [facility] with complaints of chest pain. The patient developed substernal chest pain this morning. He describes this as a discomfort. The pain is located in the chest and does not radiate to the shoulders, neck, or back. His symptoms started when he was lifting some boxes in his house. He denies abdominal pain, nausea, vomiting, or diaphoresis. [facility] and an EKG demonstrated anterolateral ST segment elevation concerning for an acute myocardial infarction. A code STEMI was called and the patient presents directly to the cardiac catheterization lab for emergency cardiac catheterization and possible PCI.  The patient was given nitroglycerin in route. His chest pain has subsided. He describes a mild soreness but no residual pain. He has no past history of similar symptoms. He has not been taking his blood pressure medicine regularly.   Past Medical History  Diagnosis Date  . Hypertension      Surgical History: History reviewed. No pertinent past surgical history.   Home Meds: Prior to Admission medications   Medication Sig Start Date End Date Taking? Authorizing Provider  hydrochlorothiazide (HYDRODIURIL) 12.5 MG tablet Take 12.5 mg by mouth daily.    Historical Provider, MD  ibuprofen (ADVIL,MOTRIN) 600 MG tablet Take 1 tablet (600 mg total) by mouth every 8 (eight) hours as needed for pain. 11/22/12   Cathlyn ParsonsAngela M Kabbe, NP  lisinopril (PRINIVIL,ZESTRIL) 20 MG tablet Take 20 mg by mouth daily.    Historical Provider, MD  ondansetron (ZOFRAN ODT) 8 MG disintegrating tablet Take 1 tablet (8 mg total) by mouth  every 8 (eight) hours as needed for nausea or vomiting. 09/22/13   Graylon GoodZachary H Baker, PA-C  silver sulfADIAZINE (SILVADENE) 1 % cream Apply 1 application topically daily. 09/18/14   Ozella Rocksavid J Merrell, MD    Allergies: No Known Allergies  History   Social History  . Marital Status: Divorced    Spouse Name: N/A  . Number of Children: N/A  . Years of Education: N/A   Occupational History  . Not on file.   Social History Main Topics  . Smoking status: Never Smoker   . Smokeless tobacco: Not on file  . Alcohol Use: No  . Drug Use: No  . Sexual Activity: Yes   Other Topics Concern  . Not on file   Social History Narrative     Family History  Problem Relation Age of Onset  . Hypertension Mother   . Diabetes Mother   . Hypertension Father     Review of Systems: General: negative for chills, fever, night sweats or weight changes.  ENT: negative for rhinorrhea or epistaxis Cardiovascular: see history of present illness  Dermatological: negative for rash Respiratory: negative for cough or wheezing GI: negative for nausea, vomiting, diarrhea, bright Hart blood per rectum, melena, or hematemesis GU: no hematuria, urgency, or frequency Neurologic: negative for visual changes, syncope, headache, or dizziness Heme: no easy bruising or bleeding Endo: negative for excessive thirst, thyroid disorder, or flushing Musculoskeletal: negative for joint pain or swelling, negative for myalgias All other systems reviewed and are otherwise negative except as noted above.  Physical Exam: Blood pressure 173/111, pulse 74,  temperature 97.5 F (36.4 C), temperature source Oral, resp. rate 22, weight 195 lb (88.451 kg), SpO2 97 %. General: Well developed, well nourished, alert and oriented, in no acute distress. HEENT: Normocephalic, atraumatic, sclera anicteric Neck: Supple. Carotids 2+ without bruits. JVP normal Lungs: Clear bilaterally to auscultation without wheezes, rales, or rhonchi. Breathing  is unlabored. Heart: RRR with normal S1 and S2. No murmurs, rubs, or gallops appreciated. Abdomen: Soft, non-tender, non-distended with normoactive bowel sounds. No hepatomegaly. No rebound/guarding. No obvious abdominal masses. Back: No CVA tenderness Msk:  Strength and tone appear normal for age. Extremities: No clubbing, cyanosis, or edema.  Distal pedal pulses are 2+ and equal bilaterally. Neuro: CNII-XII intact, moves all extremities spontaneously. Psych:  Responds to questions appropriately with a normal affect. Skin: warm and dry without rash   Labs:   Lab Results  Component Value Date   WBC 7.2 09/21/2014   HGB 15.3 09/21/2014   HCT 44.9 09/21/2014   MCV 88.2 09/21/2014   PLT 181 09/21/2014   No results for input(s): NA, K, CL, CO2, BUN, CREATININE, CALCIUM, PROT, BILITOT, ALKPHOS, ALT, AST, GLUCOSE in the last 168 hours.  Invalid input(s): LABALBU No results for input(s): CKTOTAL, CKMB, TROPONINI in the last 72 hours. No results found for: CHOL, HDL, LDLCALC, TRIG No results found for: DDIMER  Radiology/Studies:  No results found.   EKG: Normal sinus rhythm with anterolateral ST segment elevation consistent with acute anterolateral STEMI   ASSESSMENT AND PLAN:  1. Acute coronary syndrome, suspect anterolateral: the patient presents directly to the cardiac cath lab for emergency cath and PCI. I have reviewed his EKG and agree that cardiac catheterization is indicated. Further disposition pending cardiac cath results.  2. Uncontrolled hypertension: The patient's blood pressure was 220 mmHg in the emergency department. Blood pressure has responded well to IV nitroglycerin. Will need to reinforce the importance of medication compliance.  Further disposition pending the patient's cardiac catheterization results. I have explained the procedural risks, potential benefits, and alternatives to the patient who agrees to proceed.  Signed, Tonny Bollman MD 09/21/2014, 11:28  AM

## 2014-09-21 NOTE — Progress Notes (Signed)
TR BAND REMOVAL  LOCATION:    right radial  DEFLATED PER PROTOCOL:    Yes.    TIME BAND OFF / DRESSING APPLIED:  1630  SITE UPON ARRIVAL:    Level 0  SITE AFTER BAND REMOVAL:    Level 0  CIRCULATION SENSATION AND MOVEMENT:    Within Normal Limits   Yes.    COMMENTS:   Rechecked at 1700. No change in assessment noted

## 2014-09-21 NOTE — Progress Notes (Signed)
Chaplain responded to code stemi, and coordinated care with [facility] [place]. No family present yet, but girlfriend and other family en route. [facility] chaplain when family arrives should they need support. Pt went immediately to cath lab. [facility] chaplain as needed. [phone]. Tyshay Adee, Mayer MaskerCourtney F, Chaplain 09/21/2014 11:32 AM
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
## 2014-09-22 ENCOUNTER — Encounter (HOSPITAL_COMMUNITY): Payer: Self-pay | Admitting: Cardiovascular Disease

## 2014-09-22 DIAGNOSIS — E876 Hypokalemia: Secondary | ICD-10-CM

## 2014-09-22 LAB — BASIC METABOLIC PANEL
ANION GAP: 6 (ref 5–15)
BUN: 12 mg/dL (ref 6–23)
CALCIUM: 8.8 mg/dL (ref 8.4–10.5)
CO2: 28 mmol/L (ref 19–32)
CREATININE: 1.21 mg/dL (ref 0.50–1.35)
Chloride: 100 mmol/L (ref 96–112)
GFR calc non Af Amer: 65 mL/min — ABNORMAL LOW (ref >90)
GFR, EST AFRICAN AMERICAN: 75 mL/min — AB (ref >90)
Glucose, Bld: 123 mg/dL — ABNORMAL HIGH (ref 70–99)
Potassium: 3.1 mmol/L — ABNORMAL LOW (ref 3.5–5.1)
Sodium: 134 mmol/L — ABNORMAL LOW (ref 135–145)

## 2014-09-22 LAB — TROPONIN I: TROPONIN I: 0.04 ng/mL — AB (ref <0.031)

## 2014-09-22 LAB — CBC
HCT: 39.2 % (ref 39.0–52.0)
HEMOGLOBIN: 13.2 g/dL (ref 13.0–17.0)
MCH: 29.1 pg (ref 26.0–34.0)
MCHC: 33.7 g/dL (ref 30.0–36.0)
MCV: 86.3 fL (ref 78.0–100.0)
PLATELETS: 189 10*3/uL (ref 150–400)
RBC: 4.54 MIL/uL (ref 4.22–5.81)
RDW: 12.5 % (ref 11.5–15.5)
WBC: 6.4 10*3/uL (ref 4.0–10.5)

## 2014-09-22 MED ORDER — AMLODIPINE BESYLATE 10 MG PO TABS
10.0000 mg | ORAL_TABLET | Freq: Every day | ORAL | Status: DC
  Administered 2014-09-22 – 2014-09-23 (×2): 10 mg via ORAL
  Filled 2014-09-22 (×2): qty 1

## 2014-09-22 MED ORDER — LISINOPRIL 40 MG PO TABS
40.0000 mg | ORAL_TABLET | Freq: Every day | ORAL | Status: DC
  Administered 2014-09-23: 10:00:00 40 mg via ORAL
  Filled 2014-09-22: qty 1

## 2014-09-22 MED ORDER — POTASSIUM CHLORIDE CRYS ER 20 MEQ PO TBCR
40.0000 meq | EXTENDED_RELEASE_TABLET | Freq: Once | ORAL | Status: AC
  Administered 2014-09-22: 14:00:00 40 meq via ORAL
  Filled 2014-09-22: qty 2

## 2014-09-22 MED ORDER — LISINOPRIL 20 MG PO TABS
20.0000 mg | ORAL_TABLET | Freq: Once | ORAL | Status: AC
  Administered 2014-09-22: 20 mg via ORAL
  Filled 2014-09-22: qty 1

## 2014-09-22 MED ORDER — BLOOD PRESSURE CONTROL BOOK
Freq: Once | Status: AC
  Administered 2014-09-22: 12:00:00
  Filled 2014-09-22 (×2): qty 1

## 2014-09-22 MED FILL — Nitroglycerin IV Soln 100 MCG/ML in D5W: INTRA_ARTERIAL | Qty: 10 | Status: AC

## 2014-09-22 MED FILL — Midazolam HCl Inj 2 MG/2ML (Base Equivalent): INTRAMUSCULAR | Qty: 2 | Status: AC

## 2014-09-22 MED FILL — Fentanyl Citrate Inj 0.05 MG/ML: INTRAMUSCULAR | Qty: 2 | Status: AC

## 2014-09-22 MED FILL — Heparin Sodium (Porcine) 2 Unit/ML in Sodium Chloride 0.9%: INTRAMUSCULAR | Qty: 1000 | Status: AC

## 2014-09-22 MED FILL — Lidocaine HCl Local Preservative Free (PF) Inj 1%: INTRAMUSCULAR | Qty: 30 | Status: AC

## 2014-09-22 NOTE — Progress Notes (Signed)
Subjective: No specific complaints  Objective: Vital signs in last 24 hours: Temp:  [97.4 F (36.3 C)-98 F (36.7 C)] 97.8 F (36.6 C) (04/05 0859) Pulse Rate:  [56-78] 78 (04/05 0859) Resp:  [10-25] 16 (04/05 0859) BP: (133-204)/(72-116) 177/109 mmHg (04/05 0859) SpO2:  [97 %-100 %] 98 % (04/05 0859) Weight:  [191 lb 12.8 oz (87 kg)-195 lb (88.451 kg)] 191 lb 12.8 oz (87 kg) (04/05 0000)    Intake/Output from previous day: 04/04 0701 - 04/05 0700 In: 804.8 [P.O.:200; I.V.:604.8] Out: 2250 [Urine:2250] Intake/Output this shift: Total I/O In: 280 [P.O.:280] Out: 350 [Urine:350]  Medications Current Facility-Administered Medications  Medication Dose Route Frequency Provider Last Rate Last Dose  . 0.9 %  sodium chloride infusion  250 mL Intravenous PRN Tonny Bollman, MD      . acetaminophen (TYLENOL) tablet 650 mg  650 mg Oral Q4H PRN Tonny Bollman, MD   650 mg at 09/21/14 1805  . aspirin chewable tablet 81 mg  81 mg Oral Daily Tonny Bollman, MD   81 mg at 09/22/14 1002  . blood pressure control book   Does not apply Once Dwana Melena, PA-C      . heparin injection 5,000 Units  5,000 Units Subcutaneous 3 times per day Tonny Bollman, MD   5,000 Units at 09/22/14 [phone]  . hydrALAZINE (APRESOLINE) injection 10 mg  10 mg Intravenous Q6H PRN Tonny Bollman, MD      . hydrochlorothiazide (HYDRODIURIL) tablet 25 mg  25 mg Oral Daily Tonny Bollman, MD   25 mg at 09/22/14 1001  . labetalol (NORMODYNE,TRANDATE) injection 20 mg  20 mg Intravenous Q2H PRN Tonny Bollman, MD      . lisinopril (PRINIVIL,ZESTRIL) tablet 20 mg  20 mg Oral Daily Tonny Bollman, MD   20 mg at 09/22/14 1001  . nitroGLYCERIN 50 mg in dextrose 5 % 250 mL (0.2 mg/mL) infusion  10-200 mcg/min Intravenous Titrated Arby Barrette, MD      . ondansetron (ZOFRAN) injection 4 mg  4 mg Intravenous Q6H PRN Tonny Bollman, MD      . oxyCODONE-acetaminophen (PERCOCET/ROXICET) 5-325 MG per tablet 1-2 tablet  1-2 tablet  Oral Q4H PRN Tonny Bollman, MD      . sodium chloride 0.9 % injection 3 mL  3 mL Intravenous Q12H Tonny Bollman, MD   3 mL at 09/22/14 1002  . sodium chloride 0.9 % injection 3 mL  3 mL Intravenous PRN Tonny Bollman, MD        PE: General appearance: alert, cooperative and no distress Lungs: clear to auscultation bilaterally Heart: regular rate and rhythm, S1, S2 normal, no murmur, click, rub or gallop Extremities: No LEE Pulses: 2+ and symmetric Skin: warm and dry.  Right wirst cath site: stable Neurologic: Grossly normal  Lab Results:   Recent Labs  09/21/14 1052 09/21/14 1530 09/22/14 0050  WBC 7.2 7.4 6.4  HGB 15.3 13.1 13.2  HCT 44.9 38.7* 39.2  PLT 181 187 189   BMET  Recent Labs  09/21/14 1052 09/21/14 1530 09/22/14 0050  NA 139  --  134*  K 3.8  --  3.1*  CL 101  --  100  CO2 29  --  28  GLUCOSE 100*  --  123*  BUN 17  --  12  CREATININE 1.20 1.16 1.21  CALCIUM 9.3  --  8.8   PT/INR  Recent Labs  09/21/14 1052  LABPROT 13.6  INR 1.03   Cardiac Panel (last  3 results)  Recent Labs  09/21/14 1530 09/21/14 1850 09/22/14 0050  TROPONINI <0.03 <0.03 0.04*       Assessment/Plan  Active Problems:   Hypertensive emergency   Elevated troponin  SP Left heart cath revealing minimal nonobstructive coronary artery disease, hyperdynamic LV systolic function. hypertensive heart disease.   Troponin barely elevated.   Bp is still poorly controlled on HCTZ 25, lisinopril 20.  Will increased lisinopril to 40mg  and see how he is doing this afternoon.  May need another agent(Norvasc). Cardiac rehab for patient education.    LOS: 1 day    Arliene Rosenow PA-C 09/22/2014 10:56 AM

## 2014-09-22 NOTE — Progress Notes (Signed)
CARDIAC REHAB PHASE I   PRE:  Rate/Rhythm: 71 SR  BP:  Supine: 180/106  Sitting:   Standing:    SaO2:   MODE:  Ambulation: 1000 ft   POST:  Rate/Rhythm: 87 SR  BP:  Supine:   Sitting: 220/127  Standing:    SaO2:  0900-1010  Pt tolerated ambulation well without c/o. BP more elevated after walk 220/127. I continued to recheck BP as he rested from walk 205/125, 197/113. Reported BP to nurse.Completed hypertension education with pt and family.We discussed medication compliance.I also discussed low sodium and low carb diet with him. I gave pt exercise guidelines and encouraged regular exercise. Pt seems very motivated to making lifestyle changes. He may need assistance with medications and help with getting PCP, will ask nurse to get case manager consult. I encouraged pt to watch hypertension video and ordered him booklet on it.  Melina CopaLisa Lyah Millirons RN 09/22/2014 10:09 AM

## 2014-09-23 DIAGNOSIS — E785 Hyperlipidemia, unspecified: Secondary | ICD-10-CM | POA: Diagnosis present

## 2014-09-23 DIAGNOSIS — E876 Hypokalemia: Secondary | ICD-10-CM | POA: Diagnosis present

## 2014-09-23 DIAGNOSIS — R9431 Abnormal electrocardiogram [ECG] [EKG]: Secondary | ICD-10-CM | POA: Diagnosis present

## 2014-09-23 LAB — BASIC METABOLIC PANEL
Anion gap: 11 (ref 5–15)
BUN: 13 mg/dL (ref 6–23)
CALCIUM: 9.3 mg/dL (ref 8.4–10.5)
CO2: 25 mmol/L (ref 19–32)
CREATININE: 1.33 mg/dL (ref 0.50–1.35)
Chloride: 99 mmol/L (ref 96–112)
GFR calc non Af Amer: 58 mL/min — ABNORMAL LOW (ref >90)
GFR, EST AFRICAN AMERICAN: 67 mL/min — AB (ref >90)
Glucose, Bld: 95 mg/dL (ref 70–99)
POTASSIUM: 3.4 mmol/L — AB (ref 3.5–5.1)
SODIUM: 135 mmol/L (ref 135–145)

## 2014-09-23 LAB — LIPID PANEL
Cholesterol: 234 mg/dL — ABNORMAL HIGH (ref 0–200)
HDL: 46 mg/dL (ref >39)
LDL Cholesterol: 172 mg/dL — ABNORMAL HIGH (ref 0–99)
Total CHOL/HDL Ratio: 5.1 RATIO
Triglycerides: 82 mg/dL (ref <150)
VLDL: 16 mg/dL (ref 0–40)

## 2014-09-23 LAB — MAGNESIUM: Magnesium: 2.2 mg/dL (ref 1.5–2.5)

## 2014-09-23 MED ORDER — LISINOPRIL 40 MG PO TABS: 40.0000 mg | ORAL_TABLET | Freq: Every day | ORAL | Status: DC

## 2014-09-23 MED ORDER — AMLODIPINE BESYLATE 10 MG PO TABS: 10.0000 mg | ORAL_TABLET | Freq: Every day | ORAL | Status: DC

## 2014-09-23 MED ORDER — HYDROCHLOROTHIAZIDE 25 MG PO TABS: 25.0000 mg | ORAL_TABLET | Freq: Every day | ORAL | Status: DC

## 2014-09-23 NOTE — Progress Notes (Signed)
Subjective: No complaints  Objective: Vital signs in last 24 hours: Temp:  [97.7 F (36.5 C)-98.2 F (36.8 C)] 98.1 F (36.7 C) (04/06 0540) Pulse Rate:  [74-86] 75 (04/06 0540) Resp:  [14-23] 23 (04/06 0540) BP: (139-177)/(73-111) 139/73 mmHg (04/06 0540) SpO2:  [97 %-99 %] 97 % (04/06 0540) Weight:  [192 lb 14.4 oz (87.5 kg)] 192 lb 14.4 oz (87.5 kg) (04/06 0024)    Intake/Output from previous day: 04/05 0701 - 04/06 0700 In: 660 [P.O.:660] Out: 1150 [Urine:1150] Intake/Output this shift:    Medications Current Facility-Administered Medications  Medication Dose Route Frequency Provider Last Rate Last Dose  . 0.9 %  sodium chloride infusion  250 mL Intravenous PRN Tonny BollmanMichael Cooper, MD      . acetaminophen (TYLENOL) tablet 650 mg  650 mg Oral Q4H PRN Tonny BollmanMichael Cooper, MD   650 mg at 09/22/14 1623  . amLODipine (NORVASC) tablet 10 mg  10 mg Oral Daily Chrystie NoseKenneth C Hilty, MD   10 mg at 09/22/14 1420  . aspirin chewable tablet 81 mg  81 mg Oral Daily Tonny BollmanMichael Cooper, MD   81 mg at 09/22/14 1002  . heparin injection 5,000 Units  5,000 Units Subcutaneous 3 times per day Tonny BollmanMichael Cooper, MD   5,000 Units at 09/23/14 [phone]  . hydrALAZINE (APRESOLINE) injection 10 mg  10 mg Intravenous Q6H PRN Tonny BollmanMichael Cooper, MD   10 mg at 09/22/14 1251  . hydrochlorothiazide (HYDRODIURIL) tablet 25 mg  25 mg Oral Daily Tonny BollmanMichael Cooper, MD   25 mg at 09/22/14 1001  . labetalol (NORMODYNE,TRANDATE) injection 20 mg  20 mg Intravenous Q2H PRN Tonny BollmanMichael Cooper, MD      . lisinopril (PRINIVIL,ZESTRIL) tablet 40 mg  40 mg Oral Daily Dwana MelenaBryan W Zykeriah Mathia, PA-C      . nitroGLYCERIN 50 mg in dextrose 5 % 250 mL (0.2 mg/mL) infusion  10-200 mcg/min Intravenous Titrated Arby BarretteMarcy Pfeiffer, MD      . ondansetron (ZOFRAN) injection 4 mg  4 mg Intravenous Q6H PRN Tonny BollmanMichael Cooper, MD      . oxyCODONE-acetaminophen (PERCOCET/ROXICET) 5-325 MG per tablet 1-2 tablet  1-2 tablet Oral Q4H PRN Tonny BollmanMichael Cooper, MD      . sodium chloride 0.9 %  injection 3 mL  3 mL Intravenous Q12H Tonny BollmanMichael Cooper, MD   3 mL at 09/22/14 1002  . sodium chloride 0.9 % injection 3 mL  3 mL Intravenous PRN Tonny BollmanMichael Cooper, MD        PE: General appearance: alert, cooperative and no distress Lungs: clear to auscultation bilaterally Heart: regular rate and rhythm, S1, S2 normal, 1/6 systolic MM loudest in the axillae Extremities: No LEE Pulses: 2+ and symmetric Skin: warm and dry.  Neurologic: Grossly normal  Lab Results:   Recent Labs  09/21/14 1052 09/21/14 1530 09/22/14 0050  WBC 7.2 7.4 6.4  HGB 15.3 13.1 13.2  HCT 44.9 38.7* 39.2  PLT 181 187 189   BMET  Recent Labs  09/21/14 1052 09/21/14 1530 09/22/14 0050 09/23/14 0330  NA 139  --  134* 135  K 3.8  --  3.1* 3.4*  CL 101  --  100 99  CO2 29  --  28 25  GLUCOSE 100*  --  123* 95  BUN 17  --  12 13  CREATININE 1.20 1.16 1.21 1.33  CALCIUM 9.3  --  8.8 9.3   PT/INR  Recent Labs  09/21/14 1052  LABPROT 13.6  INR 1.03   Cholesterol  Recent Labs  09/23/14 0330  CHOL 234*   Lipid Panel     Component Value Date/Time   CHOL 234* 09/23/2014 0330   TRIG 82 09/23/2014 0330   HDL 46 09/23/2014 0330   CHOLHDL 5.1 09/23/2014 0330   VLDL 16 09/23/2014 0330   LDLCALC 172* 09/23/2014 0330     Assessment/Plan  Active Problems:   Hypertensive emergency   HLD   Hypokalemia  BP improved on increased lisinopril and amlodipine but not where it should be yet.  Aldosterone/renin pending.  LDL 172.  He does have good TG and HDL.  He wants to try diet and exercise first and we discussed it in detail.  I think this is fine.  Lipids in 3 months.  Replace K+  Likely DC home today with BP follow up in the  Office.   Probably needs an echo in the office to evaluate murmur which I did not hear yesterday.    LOS: 2 days    Shaina Gullatt PA-C 09/23/2014 7:08 AM

## 2014-09-23 NOTE — Discharge Summary (Signed)
Physician Discharge Summary     Cardiologist:  Excell Seltzerooper Patient ID: Craig Hart Mattioli MRN: [id number] DOB/AGE: Apr 18, 1956 59 y.o.  Admit date: 09/21/2014 Discharge date: 09/23/2014  Admission Diagnoses:  Chest pain,  Hypertensive emergency  Discharge Diagnoses:  Active Problems:   Hypertensive emergency   Hypokalemia   Abnormal EKG   Hyperlipidemia   Discharged Condition: stable  Hospital Course:   59 y.o. male w/ PMHx significant for hypertension who [facility] with complaints of chest pain. The patient developed substernal chest pain this morning. He describes this as a discomfort. The pain is located in the chest and does not radiate to the shoulders, neck, or back. His symptoms started when he was lifting some boxes in his house. He denies abdominal pain, nausea, vomiting, or diaphoresis. [facility] and an EKG demonstrated anterolateral ST segment elevation concerning for an acute myocardial infarction. A code STEMI was called and the patient presents directly to the cardiac catheterization lab for emergency cardiac catheterization and possible PCI.  The patient was given nitroglycerin in route. His chest pain has subsided. He describes a mild soreness but no residual pain. He has no past history of similar symptoms. He has not been taking his blood pressure medicine regularly.  Left heart cath revealed minimal nonobstructive coronary artery disease, hyperdynamic LV systolic function. hypertensive heart disease.Troponin barely elevated.  BP was poorly controlled so lisinopril was increased to 40mg  daily and amlodipine 10 added in addition to HCTZ 25.  Aldosterone/renin pending, if elevated, would be a good candidate for aldactone.  Cardiac rehab ordered for patient education.  LDL 172.  Lifestyle changes were discussed in detail.  Ultimately he will require statin.  He does report poor sleep - some snoring,  daytime fatigue - slept better last night. Consider outpatient sleep study.  Consider OP echo for evaluation of MM.  Recheck lipids in three months.   The patient was seen by Dr. Rennis GoldenHilty who felt he was stable for DC home.     Consults: Cardiac rehab   Significant Diagnostic Studies:   Lipid Panel     Component Value Date/Time   CHOL 234* 09/23/2014 0330   TRIG 82 09/23/2014 0330   HDL 46 09/23/2014 0330   CHOLHDL 5.1 09/23/2014 0330   VLDL 16 09/23/2014 0330   LDLCALC 172* 09/23/2014 0330    Cardiac Catheterization Procedure Note  Name: Craig Hart Butikofer MRN: [id number] DOB: Apr 18, 1956  Procedure: Left Heart Cath, Selective Coronary Angiography, LV angiography  Indication: Chest pain and abnormal EKG. Initially, a code STEMI was called as [name] [facility]. He presented with substernal chest pain and EKG was suggestive of an acute anterolateral myocardial infarction. He presented directly to the cardiac catheterization lab for cardiac cath and possible PCI.  Procedural Details: The right wrist was prepped, draped, and anesthetized with 1% lidocaine. Using the modified Seldinger technique, a 5/6 French Slender sheath was introduced into the right radial artery. 3 mg of verapamil was administered through the sheath, weight-based unfractionated heparin was administered intravenously. Standard Judkins catheters were used for selective coronary angiography and left ventriculography. The right subclavian artery had marked tortuosity. A guide catheter had to be used to inject the native RCA. A JR4 6 [place] guide catheter was utilized. Catheter exchanges were performed over an exchange length guidewire. There were no immediate procedural complications. A TR band was used for radial hemostasis at the  completion of the procedure. The patient was transferred to the post catheterization recovery area for further  monitoring.  Procedural Findings: Hemodynamics: AO 144/83 with a mean of 112 LV 140/22  Coronary angiography: Coronary dominance: right  Left mainstem: Widely patent vessel with no obstructive disease. Divides into the LAD and left circumflex.  Left anterior descending (LAD): The LAD is patent. There are mild irregularities without significant stenoses. The diagonal branches are patent. The LAD wraps around the left ventricular apex.  Left circumflex (LCx): The circumflex is patent. The OM and posterior lateral branches are patent. There is no obstructive disease present.  Right coronary artery (RCA): Large, dominant vessel. The vessel has diffuse irregularity without significant stenosis. At the junction of the proximal and mid vessel, there is 20-30% stenosis. The PDA and PLA branches are patent without stenosis.  Left ventriculography: Left ventricular systolic function is hyperdynamic, LVEF is estimated at 65-75%, there is no significant mitral regurgitation   Estimated Blood Loss: Minimal  Final Conclusions:  1. Minimal nonobstructive coronary artery disease as detailed above 2. Hyperdynamic LV systolic function 3. Hypertensive heart disease  Recommendations: The patient needs management of his severe hypertension. Will cycle cardiac enzymes. Anticipate hospital discharge next 24-48 hours as long as blood pressure is controlled.  Tonny Bollman MD, [facility] 09/21/2014, 12:10 PM  Treatments:  See above  Discharge Exam: Blood pressure 167/96, pulse 75, temperature 98.2 F (36.8 C), temperature source Oral, resp. rate 30, weight 192 lb 14.4 oz (87.5 kg), SpO2 99 %.   Disposition: 01-Home or Self Care      Discharge Instructions    Diet - low sodium heart healthy    Complete by:  As directed             Medication List    TAKE these medications        amLODipine 10 MG tablet  Commonly known as:  NORVASC  Take 1 tablet (10 mg total) by mouth daily.      hydrochlorothiazide 25 MG tablet  Commonly known as:  HYDRODIURIL  Take 1 tablet (25 mg total) by mouth daily.     ibuprofen 600 MG tablet  Commonly known as:  ADVIL,MOTRIN  Take 1 tablet (600 mg total) by mouth every 8 (eight) hours as needed for pain.     lisinopril 40 MG tablet  Commonly known as:  PRINIVIL,ZESTRIL  Take 1 tablet (40 mg total) by mouth daily.     ondansetron 8 MG disintegrating tablet  Commonly known as:  ZOFRAN ODT  Take 1 tablet (8 mg total) by mouth every 8 (eight) hours as needed for nausea or vomiting.     silver sulfADIAZINE 1 % cream  Commonly known as:  SILVADENE  Apply 1 application topically daily.       Follow-up Information    Follow up with Tonny Bollman, MD.   Specialty:  Cardiology   Why:  The office will call you with the appt date and time.    Contact information:   [address] [place] [address] [phone]      Greater than 30 minutes was spent completing the patient's discharge.   SignedWilburt Finlay, PAC 09/23/2014, 11:40 AM

## 2014-09-23 NOTE — Progress Notes (Signed)
CARDIAC REHAB PHASE I   PRE:  Rate/Rhythm: 85 SR  BP:  Sitting: 167/96        SaO2: 98 RA  MODE:  Ambulation: 1000 ft   POST:  Rate/Rhythm: 93 SR  BP:  Lyingg: 166/93         SaO2: 99 RA Pt ambulated independently 1000 ft with no complaints. Pt BP improved from yesterday post-ambulation. Pt states he feels better today.  Discussed importance of taking medication as prescribed at discharge, not missing or adjusting doses, and following up with PCP.  Reinforced heart healthy, low salt diet.  Pt states he has seen CM and received information on obtaining medication, establishing PCP.   Pt watching HTN video upon leaving room.   [phone]  Joylene GrapesMonge, Kenzlee Fishburn C, RN, BSN 09/23/2014 8:32 AM

## 2014-09-25 LAB — ALDOSTERONE + RENIN ACTIVITY W/ RATIO
ALDO / PRA Ratio: 10 (ref 0.0–30.0)
ALDOSTERONE: 3 ng/dL (ref 0.0–30.0)
PRA LC/MS/MS: 0.3 ng/mL/hr

## 2014-09-29 ENCOUNTER — Encounter (HOSPITAL_COMMUNITY): Payer: Self-pay | Admitting: Emergency Medicine

## 2014-09-29 ENCOUNTER — Emergency Department (INDEPENDENT_AMBULATORY_CARE_PROVIDER_SITE_OTHER)
Admission: EM | Admit: 2014-09-29 | Discharge: 2014-09-29 | Disposition: A | Discharge status: Home / Self Care | Payer: Self-pay | Source: Home / Self Care | Attending: Family Medicine | Admitting: Family Medicine

## 2014-09-29 DIAGNOSIS — T22439D Corrosion of unspecified degree of unspecified upper arm, subsequent encounter: Secondary | ICD-10-CM

## 2014-09-29 DIAGNOSIS — T5494XD Toxic effect of unspecified corrosive substance, undetermined, subsequent encounter: Secondary | ICD-10-CM

## 2014-09-29 MED ORDER — HYDROCORTISONE ACE-PRAMOXINE 2.5-1 % RE CREA: TOPICAL_CREAM | RECTAL | Status: DC

## 2014-09-29 NOTE — Discharge Instructions (Signed)
Chemical Burn Many chemicals can burn the skin. A chemical burn should be flushed with cool water and checked by an emergency caregiver. Your skin is a natural barrier to infection. It is the largest organ of your body. Burns damage this natural protection. To help prevent infection, it is very important to follow your caregiver's instructions in the care of your burn.  Many industrial chemicals may cause burns. These chemicals include acids, alkalis, and organic compounds such as petroleum, phenol, bitumen, tar, and grease. When acids come in contact with the skin, they cause an immediate change in the skin.Acid burns produce significant pain and form a scab (eschar). Usually, the immediate skin changes are the only damage from an acid burn.However, exposure to formic acid, chromic acid, or hydrofluoric acid may affect the whole body and may even be life-threatening. Alkalis include lye, cement, lime, and many chemicals with "hydroxide" in their name.An alkali burn may be less apparent than an acid burn at first. However, alkalis may cause greater tissue damage.It is important to be aware of any chemicals you are using. Treat any exposure to skin, eyes, or mucous membranes (nose, mouth, throat) as a potential emergency. PREVENTION  Avoid exposure to toxic chemicals that can cause burns.  Store chemicals out of the reach of children.  Use protective gloves when handling dangerous chemicals. HOME CARE INSTRUCTIONS   Wash your hands well before changing your bandage.  Change your bandage as often as directed by your caregiver.  Remove the old bandage. If the bandage sticks, you may soak it off with cool, clean water.  Cleanse the burn thoroughly but gently with mild soap and water.  Pat the area dry with a clean, dry cloth.  Apply a thin layer of antibacterial cream to the burn.  Apply a clean bandage as instructed by your caregiver.  Keep the bandage as clean and dry as  possible.  Elevate the affected area for the first 24 hours, then as instructed by your caregiver.  Only take over-the-counter or prescription medicines for pain, discomfort, or fever as directed by your caregiver.  Keep all follow-up appointments.This is important. This is how your caregiver can tell if your treatment is working. SEEK IMMEDIATE MEDICAL CARE IF:   You develop excessive pain.  You develop redness, tenderness, swelling, or red streaks near the burn.  The burned area develops yellowish-white fluid (pus) or a bad smell.  You have a fever. MAKE SURE YOU:   Understand these instructions.  Will watch your condition.  Will get help right away if you are not doing well or get worse. Document Released: 03/11/2004 Document Revised: 08/28/2011 Document Reviewed: 10/31/2010 Exit[employer] Patient Information 2015 [employer]. This information is not intended to replace advice given to you by your health care provider. Make sure you discuss any questions you have with your health care provider.  
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
## 2014-10-13 NOTE — Progress Notes (Signed)
Cardiology Office Note   Date:  10/13/2014   ID:  Craig Hart, DOB [birth year], MRN [id number]  PCP:  No PCP Per Patient  Cardiologist:  Dr. Tonny Bollman    Chief Complaint  Patient presents with  . Hospitalization Follow-up    Hypertension     History of Present Illness: Craig Hart is a 59 y.o. male with a hx of HTN.  Admitted 4/4-4/6 with chest pain.  ECG demonstrated anterolateral STE and code STEMI was called.  LHC demonstrated minimal non-obstructive CAD and hyperdynamic LVF.   Troponin was minimally elevated (0.04).  BP was uncontrolled.  Medications were adjusted.  BP was better controlled at DC.  LDL was high and patient will need FU to determine +/- statin Rx.  Aldosterone and PRA were obtained prior to DC.  This demonstrated no evidence of Hyperaldosteronism.  Patient had symptoms of OSA and OP sleep study can be considered.    He returns for FU.     Studies/Reports Reviewed Today:  LHC 09/21/14 LM: Widely patent  LAD:  patent. There are mild irregularities without significant stenoses.  LCx:   patent.  RCA:   diffuse irregularity without significant stenosis. At the junction of the proximal and mid vessel, there is 20-30% stenosis.  Left ventriculography: Left ventricular systolic function is hyperdynamic, LVEF is estimated at 65-75%, there is no significant mitral regurgitation    Past Medical History  Diagnosis Date  . Hypertension   . Hypertensive emergency 09/21/2014    Past Surgical History  Procedure Laterality Date  . Tonsillectomy    . Cardiac catheterization  09/21/2014  . Left heart catheterization with coronary angiogram N/A 09/21/2014    Procedure: LEFT HEART CATHETERIZATION WITH CORONARY ANGIOGRAM;  Surgeon: Tonny Bollman, MD;  Location: [facility] CATH LAB;  Service: Cardiovascular;  Laterality: N/A;     Current Outpatient Prescriptions  Medication Sig Dispense Refill  . amLODipine (NORVASC) 10 MG tablet Take 1 tablet (10 mg total) by mouth  daily. 30 tablet 11  . hydrochlorothiazide (HYDRODIURIL) 25 MG tablet Take 1 tablet (25 mg total) by mouth daily. 30 tablet 11  . hydrocortisone-pramoxine (ANALPRAM-HC) 2.5-1 % rectal cream Apply twice a day; may substitute for a different strength cream or lotion or any strength hydrocortisone/lidocaine preparation depending on patient's insurance coverage 30 g 0  . ibuprofen (ADVIL,MOTRIN) 600 MG tablet Take 1 tablet (600 mg total) by mouth every 8 (eight) hours as needed for pain. (Patient not taking: Reported on 09/21/2014) 30 tablet 0  . lisinopril (PRINIVIL,ZESTRIL) 40 MG tablet Take 1 tablet (40 mg total) by mouth daily. 30 tablet 11  . ondansetron (ZOFRAN ODT) 8 MG disintegrating tablet Take 1 tablet (8 mg total) by mouth every 8 (eight) hours as needed for nausea or vomiting. (Patient not taking: Reported on 09/21/2014) 12 tablet 0  . silver sulfADIAZINE (SILVADENE) 1 % cream Apply 1 application topically daily. 50 g 0   No current facility-administered medications for this visit.    Allergies:   Review of patient's allergies indicates no known allergies.    Social History:  The patient  reports that he has never smoked. He has never used smokeless tobacco. He reports that he does not drink alcohol or use illicit drugs.   Family History:  The patient's family history includes Diabetes in his mother; Hypertension in his father and mother.    ROS:   Please see the history of present illness.   ROS    PHYSICAL EXAM: VS:  There were no vitals taken for this visit.    Wt Readings from Last 3 Encounters:  09/23/14 192 lb 14.4 oz (87.5 kg)  05/10/13 208 lb (94.348 kg)     GEN: Well nourished, well developed, in no acute distress HEENT: normal Neck: no JVD, no carotid bruits, no masses Cardiac:  Normal S1/S2, RRR; no murmur ,  no rubs or gallops, no edema  Respiratory:  clear to auscultation bilaterally, no wheezing, rhonchi or rales. GI: soft, nontender, nondistended, + BS MS: no  deformity or atrophy Skin: warm and dry  Neuro:  CNs II-XII intact, Strength and sensation are intact Psych: Normal affect   EKG:  EKG is ordered today.  It demonstrates:      Recent Labs: 09/21/2014: ALT 21 09/22/2014: Hemoglobin 13.2; Platelets 189 09/23/2014: BUN 13; Creatinine 1.33; Magnesium 2.2; Potassium 3.4*; Sodium 135   Aldo/PRA ratio 10  Lipid Panel    Component Value Date/Time   CHOL 234* 09/23/2014 0330   TRIG 82 09/23/2014 0330   HDL 46 09/23/2014 0330   CHOLHDL 5.1 09/23/2014 0330   VLDL 16 09/23/2014 0330   LDLCALC 172* 09/23/2014 0330      ASSESSMENT AND PLAN:  Hypertensive heart disease without heart failure  Hyperlipidemia  Snoring    Current medicines are reviewed at length with the patient today.  Concerns regarding medicines are as outlined above.  The following changes have been made:       Labs/ tests ordered today include:  No orders of the defined types were placed in this encounter.    Disposition:   FU with    Signed, Brynda RimScott Burrel Legrand, PA-C, MHS 10/13/2014 10:03 PM    [facility] [address], [place], [place]  [address] Phone: [phone]; Fax: [phone]    This encounter was created in error - please disregard.

## 2014-10-14 ENCOUNTER — Encounter: Payer: Self-pay | Admitting: Physician Assistant

## 2014-10-14 VITALS — Ht 71.0 in

## 2015-07-26 ENCOUNTER — Emergency Department (HOSPITAL_COMMUNITY)
Admission: EM | Admit: 2015-07-26 | Discharge: 2015-07-26 | Disposition: A | Discharge status: Home / Self Care | Payer: Medicaid Other | Attending: Emergency Medicine | Admitting: Emergency Medicine

## 2015-07-26 ENCOUNTER — Emergency Department (HOSPITAL_COMMUNITY): Payer: Medicaid Other

## 2015-07-26 ENCOUNTER — Encounter (HOSPITAL_COMMUNITY): Payer: Self-pay | Admitting: Emergency Medicine

## 2015-07-26 DIAGNOSIS — Z792 Long term (current) use of antibiotics: Secondary | ICD-10-CM | POA: Insufficient documentation

## 2015-07-26 DIAGNOSIS — Z23 Encounter for immunization: Secondary | ICD-10-CM | POA: Insufficient documentation

## 2015-07-26 DIAGNOSIS — S60452A Superficial foreign body of right middle finger, initial encounter: Secondary | ICD-10-CM | POA: Diagnosis present

## 2015-07-26 DIAGNOSIS — Y9389 Activity, other specified: Secondary | ICD-10-CM | POA: Insufficient documentation

## 2015-07-26 DIAGNOSIS — Z9889 Other specified postprocedural states: Secondary | ICD-10-CM | POA: Diagnosis not present

## 2015-07-26 DIAGNOSIS — Y9289 Other specified places as the place of occurrence of the external cause: Secondary | ICD-10-CM | POA: Insufficient documentation

## 2015-07-26 DIAGNOSIS — W458XXA Other foreign body or object entering through skin, initial encounter: Secondary | ICD-10-CM | POA: Insufficient documentation

## 2015-07-26 DIAGNOSIS — T148XXA Other injury of unspecified body region, initial encounter: Secondary | ICD-10-CM

## 2015-07-26 DIAGNOSIS — I10 Essential (primary) hypertension: Secondary | ICD-10-CM | POA: Insufficient documentation

## 2015-07-26 DIAGNOSIS — Z79899 Other long term (current) drug therapy: Secondary | ICD-10-CM | POA: Diagnosis not present

## 2015-07-26 DIAGNOSIS — Y998 Other external cause status: Secondary | ICD-10-CM | POA: Diagnosis not present

## 2015-07-26 MED ORDER — BACITRACIN ZINC 500 UNIT/GM EX OINT
TOPICAL_OINTMENT | Freq: Two times a day (BID) | CUTANEOUS | Status: DC
  Administered 2015-07-26: 1 via TOPICAL
  Filled 2015-07-26: qty 0.9

## 2015-07-26 MED ORDER — LIDOCAINE HCL (PF) 1 % IJ SOLN
2.0000 mL | Freq: Once | INTRAMUSCULAR | Status: AC
  Administered 2015-07-26: 2 mL
  Filled 2015-07-26: qty 5

## 2015-07-26 MED ORDER — TETANUS-DIPHTH-ACELL PERTUSSIS 5-2.5-18.5 LF-MCG/0.5 IM SUSP
0.5000 mL | Freq: Once | INTRAMUSCULAR | Status: AC
  Administered 2015-07-26: 0.5 mL via INTRAMUSCULAR
  Filled 2015-07-26: qty 0.5

## 2015-07-26 NOTE — ED Notes (Signed)
Pt stated "I take lisinopril & a fluid pill.  I saw my doctor 3 mos ago and it was good."  Pt encouraged to call PCP in the a.m. & schedule f/u of elevated b/p.  Pt verbalized understanding.

## 2015-07-26 NOTE — ED Provider Notes (Signed)
CSN: [id number]     Arrival date & time 07/26/15  2041 History  By signing my name below, I, Phillis Haggis, attest that this documentation has been prepared under the direction and in the presence of Earley Favor, NP-C. Electronically Signed: Phillis Haggis, ED Scribe. 07/26/2015. 10:58 PM.   Chief Complaint  Patient presents with  . Foreign Body   The history is provided by the patient. No language interpreter was used.  HPI Comments: Craig Hart is a 60 y.o. Male with a hx of HTN who presents to the Emergency Department complaining of a foreign body to the right middle finger onset one week ago. Pt states that he believes he has a piece of wood stuck in the fold of skin at the DIP joint. He reports that the area swelled and his friend attempted to remove the foreign body. Pt states that pustulant liquid came out of the area. He has not taken anything for the pain. He denies fever, chills, nausea, vomiting, numbness or weakness.   Past Medical History  Diagnosis Date  . Hypertension   . Hypertensive emergency 09/21/2014   Past Surgical History  Procedure Laterality Date  . Tonsillectomy    . Cardiac catheterization  09/21/2014  . Left heart catheterization with coronary angiogram N/A 09/21/2014    Procedure: LEFT HEART CATHETERIZATION WITH CORONARY ANGIOGRAM;  Surgeon: Tonny Bollman, MD;  Location: [facility] CATH LAB;  Service: Cardiovascular;  Laterality: N/A;   Family History  Problem Relation Age of Onset  . Hypertension Mother   . Diabetes Mother   . Hypertension Father    Social History  Substance Use Topics  . Smoking status: Never Smoker   . Smokeless tobacco: Never Used  . Alcohol Use: No    Review of Systems  Constitutional: Negative for fever and chills.  Gastrointestinal: Negative for nausea and vomiting.  Musculoskeletal: Positive for joint swelling.  Skin: Positive for color change and wound.  Neurological: Negative for weakness and numbness.  All other systems  reviewed and are negative.     Allergies  Review of patient's allergies indicates no known allergies.  Home Medications   Prior to Admission medications   Medication Sig Start Date End Date Taking? Authorizing Provider  amLODipine (NORVASC) 10 MG tablet Take 1 tablet (10 mg total) by mouth daily. 09/23/14   Dwana Melena, PA-C  hydrochlorothiazide (HYDRODIURIL) 25 MG tablet Take 1 tablet (25 mg total) by mouth daily. 09/23/14   Dwana Melena, PA-C  hydrocortisone-pramoxine [facility]) 2.5-1 % rectal cream Apply twice a day; may substitute for a different strength cream or lotion or any strength hydrocortisone/lidocaine preparation depending on patient's insurance coverage 09/29/14   Graylon Good, PA-C  lisinopril (PRINIVIL,ZESTRIL) 40 MG tablet Take 1 tablet (40 mg total) by mouth daily. 09/23/14   Dwana Melena, PA-C  silver sulfADIAZINE (SILVADENE) 1 % cream Apply 1 application topically daily. 09/18/14   Ozella Rocks, MD   BP 173/115 mmHg  Pulse 70  Temp(Src) 98.7 F (37.1 C) (Oral)  Resp 15  SpO2 99% Physical Exam  Constitutional: He appears well-developed and well-nourished.  HENT:  Head: Normocephalic.  Eyes: Pupils are equal, round, and reactive to light.  Neck: Normal range of motion.  Pulmonary/Chest: Effort normal.  Musculoskeletal: He exhibits edema and tenderness.  Neurological: He is alert.  Nursing note and vitals reviewed.   ED Course  .Foreign Body Removal Date/Time: 07/26/2015 10:54 PM Performed by: Earley Favor Authorized by: Earley Favor Consent: Verbal  consent obtained. Written consent not obtained. Risks and benefits: risks, benefits and alternatives were discussed Consent given by: patient Patient understanding: patient states understanding of the procedure being performed Patient identity confirmed: verbally with patient Time out: Immediately prior to procedure a "time out" was called to verify the correct patient, procedure, equipment, support staff  and site/side marked as required. Body area: skin Anesthesia: local infiltration Local anesthetic: lidocaine 1% without epinephrine Anesthetic total: 1 ml Patient sedated: no Localization method: visualized Removal mechanism: scalpel Dressing: antibiotic ointment Complexity: simple 1 objects recovered. Objects recovered: splinter Post-procedure assessment: foreign body removed Patient tolerance: Patient tolerated the procedure well with no immediate complications   (including critical care time) DIAGNOSTIC STUDIES: Oxygen Saturation is 99% on RA, normal by my interpretation.    COORDINATION OF CARE: 10:16 PM-Discussed treatment plan which includes I&D with pt at bedside and pt agreed to plan.    Site anesthetized, incision made over site, wound drained and explored loculations, rinsed with copious amounts of normal saline, wound packed with sterile gauze, covered with dry, sterile dressing.  Pt tolerated procedure well without complications.  Instructions for care discussed verbally and pt provided with additional written instructions for homecare and f/u.   Labs Review Labs Reviewed - No data to display  Imaging Review Dg Finger Middle Right  07/26/2015  CLINICAL DATA:  Patient states he has been working on a house and is not sure of he got a splinter or paint chip embedded in his finger, right middle finger has been painful and swollen for past week, thinks object went in first joint of finger, palmer surface EXAM: RIGHT MIDDLE FINGER 2+V COMPARISON:  None. FINDINGS: Mild diffuse soft tissue swelling. No periosteal reaction, fracture, or dislocation. Tiny osteophyte DIP joint of second and third fingers. No radiodense foreign body. IMPRESSION: Soft tissue swelling with no other acute findings Electronically Signed   By: Esperanza Heir M.D.   On: 07/26/2015 21:35   I have personally reviewed and evaluated these images and lab results as part of my medical decision-making.    EKG Interpretation None      MDM   Final diagnoses:  Splinter in skin   I personally performed the services described in this documentation, which was scribed in my presence. The recorded information has been reviewed and is accurate.     Earley Favor, NP 07/26/15 2259  Arby Barrette, MD 07/27/15 [phone]

## 2015-07-26 NOTE — ED Notes (Signed)
Pt states that he thinks that he has a piece of wood or a foreign body in his R middle finger. Happened 5 days ago. Finger is swollen. Alert and oriented.

## 2015-07-26 NOTE — Discharge Instructions (Signed)
The splinter was removed  Keep covered for the next several days

## 2015-07-30 ENCOUNTER — Emergency Department (HOSPITAL_COMMUNITY): Payer: Medicaid Other

## 2015-07-30 ENCOUNTER — Encounter (HOSPITAL_COMMUNITY): Payer: Self-pay

## 2015-07-30 ENCOUNTER — Emergency Department (HOSPITAL_COMMUNITY)
Admission: EM | Admit: 2015-07-30 | Discharge: 2015-07-30 | Disposition: A | Discharge status: Home / Self Care | Payer: Medicaid Other | Attending: Emergency Medicine | Admitting: Emergency Medicine

## 2015-07-30 ENCOUNTER — Encounter (HOSPITAL_COMMUNITY): Payer: Self-pay | Admitting: Emergency Medicine

## 2015-07-30 ENCOUNTER — Ambulatory Visit (HOSPITAL_COMMUNITY): Admit: 2015-07-30 | Payer: Self-pay | Admitting: Cardiology

## 2015-07-30 DIAGNOSIS — I10 Essential (primary) hypertension: Secondary | ICD-10-CM | POA: Diagnosis not present

## 2015-07-30 DIAGNOSIS — Z79899 Other long term (current) drug therapy: Secondary | ICD-10-CM | POA: Insufficient documentation

## 2015-07-30 DIAGNOSIS — R079 Chest pain, unspecified: Secondary | ICD-10-CM | POA: Diagnosis present

## 2015-07-30 DIAGNOSIS — R0789 Other chest pain: Secondary | ICD-10-CM | POA: Diagnosis not present

## 2015-07-30 LAB — I-STAT TROPONIN, ED
Troponin i, poc: 0 ng/mL (ref 0.00–0.08)
Troponin i, poc: 0 ng/mL (ref 0.00–0.08)

## 2015-07-30 LAB — BASIC METABOLIC PANEL
ANION GAP: 12 (ref 5–15)
BUN: 15 mg/dL (ref 6–20)
CHLORIDE: 99 mmol/L — AB (ref 101–111)
CO2: 25 mmol/L (ref 22–32)
Calcium: 9.7 mg/dL (ref 8.9–10.3)
Creatinine, Ser: 1.32 mg/dL — ABNORMAL HIGH (ref 0.61–1.24)
GFR calc Af Amer: >60 mL/min (ref >60)
GFR, EST NON AFRICAN AMERICAN: 58 mL/min — AB (ref >60)
GLUCOSE: 98 mg/dL (ref 65–99)
POTASSIUM: 3.7 mmol/L (ref 3.5–5.1)
Sodium: 136 mmol/L (ref 135–145)

## 2015-07-30 LAB — CBC
HEMATOCRIT: 48.1 % (ref 39.0–52.0)
HEMOGLOBIN: 16.5 g/dL (ref 13.0–17.0)
MCH: 29.7 pg (ref 26.0–34.0)
MCHC: 34.3 g/dL (ref 30.0–36.0)
MCV: 86.5 fL (ref 78.0–100.0)
Platelets: 210 10*3/uL (ref 150–400)
RBC: 5.56 MIL/uL (ref 4.22–5.81)
RDW: 12.3 % (ref 11.5–15.5)
WBC: 6.5 10*3/uL (ref 4.0–10.5)

## 2015-07-30 SURGERY — LEFT HEART CATH AND CORONARY ANGIOGRAPHY
Anesthesia: LOCAL

## 2015-07-30 MED ORDER — ASPIRIN 81 MG PO CHEW
162.0000 mg | CHEWABLE_TABLET | Freq: Once | ORAL | Status: AC
  Administered 2015-07-30: 162 mg via ORAL
  Filled 2015-07-30: qty 2

## 2015-07-30 NOTE — ED Notes (Signed)
Pt stable, ambulatory, states understanding of discharge instructions 

## 2015-07-30 NOTE — ED Notes (Signed)
Patient came in as code stemi, but was deactivated by Dr. [place], cardiology.   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  Patient had described it as mid chest pain that radiated to R arm and R shoulder.   Patient denied SOB, diaphoresis, and N/V.   Patient went to PCP this morning for a follow up appointment after receiving new blood pressure medications recently.   Patient states his PCP called EMS.   Patient received 81 mg ASA at PCP, and 1 NTG SL en route by EMS.   Patient had pain of 3/10 en route, but states is resolved at this time. Patient denies other symptoms.

## 2015-07-30 NOTE — ED Provider Notes (Signed)
CSN: [id number]     Arrival date & time 07/30/15  1059 History   First MD Initiated Contact with Patient 07/30/15 1117     Chief Complaint  Patient presents with  . Chest Pain     (Consider location/radiation/quality/duration/timing/severity/associated sxs/prior Treatment) HPI 60 year old male who presents with chest pain. History of hypertension. Had a left heart catheterization in 2016 revealing minimal nonobstructive CAD. 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 That evening after work noticed that he had soreness in the center aspect of his chest wall. States that it seemed to be exacerbated whenever he would turn his head over his right shoulder. Has not had any shortness of breath, diaphoresis, nausea or vomiting, lower extremity edema or pain, orthopnea or PND. States that he walked several laps around [place] shopping [place] yesterday, I did not have any exacerbation of his pain. He was seen at his primary care doctor's office today for blood pressure check, and noted that he had some chest discomfort. They performed the EKG that was concerning to them for an acute STEMI and was subsequently transferred to the ED as a code STEMI.    Past Medical History  Diagnosis Date  . Hypertension   . Hypertensive emergency 09/21/2014   Past Surgical History  Procedure Laterality Date  . Tonsillectomy    . Cardiac catheterization  09/21/2014  . Left heart catheterization with coronary angiogram N/A 09/21/2014    Procedure: LEFT HEART CATHETERIZATION WITH CORONARY ANGIOGRAM;  Surgeon: Tonny Bollman, MD;  Location: [facility] CATH LAB;  Service: Cardiovascular;  Laterality: N/A;   Family History  Problem Relation Age of Onset  . Hypertension Mother   . Diabetes Mother   . Hypertension Father    Social History  Substance Use Topics  . Smoking status: Never Smoker   . Smokeless tobacco: Never Used  . Alcohol Use: No    Review of  Systems 10/14 systems reviewed and are negative other than those stated in the HPI    Allergies  Review of patient's allergies indicates no known allergies.  Home Medications   Prior to Admission medications   Medication Sig Start Date End Date Taking? Authorizing Provider  amLODipine (NORVASC) 10 MG tablet Take 1 tablet (10 mg total) by mouth daily. 09/23/14  Yes Dwana Melena, PA-C  hydrochlorothiazide (HYDRODIURIL) 25 MG tablet Take 1 tablet (25 mg total) by mouth daily. 09/23/14  Yes Dwana Melena, PA-C  lisinopril (PRINIVIL,ZESTRIL) 40 MG tablet Take 1 tablet (40 mg total) by mouth daily. 09/23/14  Yes Dwana Melena, PA-C  hydrocortisone-pramoxine [facility]) 2.5-1 % rectal cream Apply twice a day; may substitute for a different strength cream or lotion or any strength hydrocortisone/lidocaine preparation depending on patient's insurance coverage Patient not taking: Reported on 07/30/2015 09/29/14   Graylon Good, PA-C  silver sulfADIAZINE (SILVADENE) 1 % cream Apply 1 application topically daily. Patient not taking: Reported on 07/30/2015 09/18/14   Ozella Rocks, MD   BP 139/96 mmHg  Pulse 67  Temp(Src) 97.5 F (36.4 C) (Oral)  Resp 15  Ht  (1.803 m)  Wt 222 lb (100.699 kg)  BMI 30.98 kg/m2  SpO2 98% Physical Exam  ED Course  Procedures (including critical care time) Labs Review Labs Reviewed  BASIC METABOLIC PANEL - Abnormal; Notable for the following:    Chloride 99 (*)    Creatinine, Ser 1.32 (*)    GFR calc non  Af Amer 58 (*)    All other components within normal limits  CBC  I-STAT TROPOININ, ED  I-STAT TROPOININ, ED    Imaging Review Dg Chest 2 View  07/30/2015  CLINICAL DATA:  Hypertension with chest pain EXAM: CHEST  2 VIEW COMPARISON:  September 21, 2014 FINDINGS: Lungs are clear. Heart size and pulmonary vascularity are normal. No adenopathy. No pneumothorax. No bone lesions. IMPRESSION: No edema or consolidation. Electronically Signed   By: Bretta Bang III M.D.   On: 07/30/2015 12:07   I have personally reviewed and evaluated these images and lab results as part of my medical decision-making.   EKG Interpretation   Date/Time:  Friday July 30 2015 14:34:43 EST Ventricular Rate:  81 PR Interval:  174 QRS Duration: 82 QT Interval:  396 QTC Calculation: 460 R Axis:   35 Text Interpretation:  Sinus rhythm Left atrial enlargement Abnormal R-wave  progression, early transition ST elevation suggests acute pericarditis No  significant change since last tracing Confirmed by Leora Platt MD, Annabelle Harman ([id number]) on  07/30/2015 2:37:28 PM      MDM   Final diagnoses:  Chest wall pain    60 year old male with history of hypertension who presents with chest pain. Dr. Excell Seltzer at bedside on patient arrival, and confirms that EKG not consistent with STEMI and unchanged from prior EKGs. 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 Given full dose aspirin. Serial troponins are negative and EKG without dynamic changes. CXR without acute cardiopulmonary processes. Heart score 2, and felt adequately ruled out with atypical history for that of ACS. Also with LHC showing minimal CAD less than one year ago, which is reassuring. History not suggestive of PE or dissection or others serious intrathoracic etiology.  He will continue to follow-up with PCP. Strict return and follow-up instructions reviewed. He expressed understanding of all discharge instructions and felt comfortable with the plan of care.    Lavera Guise, MD 07/30/15 [id number]

## 2015-07-30 NOTE — Discharge Instructions (Signed)
Continue to take tylenol and/or ibuprofen as needed for pain. Return for worsening symptoms, including worsening pain (especially with exertional activity), difficulty breathing, passing out, or any other symptoms concerning to you.  Nonspecific Chest Pain It is often hard to find the cause of chest pain. There is always a chance that your pain could be related to something serious, such as a heart attack or a blood clot in your lungs. Chest pain can also be caused by conditions that are not life-threatening. If you have chest pain, it is very important to follow up with your doctor.  HOME CARE  If you were prescribed an antibiotic medicine, finish it all even if you start to feel better.  Avoid any activities that cause chest pain.  Do not use any tobacco products, including cigarettes, chewing tobacco, or electronic cigarettes. If you need help quitting, ask your doctor.  Do not drink alcohol.  Take medicines only as told by your doctor.  Keep all follow-up visits as told by your doctor. This is important. This includes any further testing if your chest pain does not go away.  Your doctor may tell you to keep your head raised (elevated) while you sleep.  Make lifestyle changes as told by your doctor. These may include:  Getting regular exercise. Ask your doctor to suggest some activities that are safe for you.  Eating a heart-healthy diet. Your doctor or a diet specialist (dietitian) can help you to learn healthy eating options.  Maintaining a healthy weight.  Managing diabetes, if necessary.  Reducing stress. GET HELP IF:  Your chest pain does not go away, even after treatment.  You have a rash with blisters on your chest.  You have a fever. GET HELP RIGHT AWAY IF:  Your chest pain is worse.  You have an increasing cough, or you cough up blood.  You have severe belly (abdominal) pain.  You feel extremely weak.  You pass out (faint).  You have chills.  You have  sudden, unexplained chest discomfort.  You have sudden, unexplained discomfort in your arms, back, neck, or jaw.  You have shortness of breath at any time.  You suddenly start to sweat, or your skin gets clammy.  You feel nauseous.  You vomit.  You suddenly feel light-headed or dizzy.  Your heart begins to beat quickly, or it feels like it is skipping beats. These symptoms may be an emergency. Do not wait to see if the symptoms will go away. Get medical help right away. Call your local emergency services (911 in the [place] Do not drive yourself to the hospital.   This information is not intended to replace advice given to you by your health care provider. Make sure you discuss any questions you have with your health care provider.   Document Released: 11/22/2007 Document Revised: 06/26/2014 Document Reviewed: 01/09/2014 Elsevier Interactive Patient Education [employer]

## 2017-08-19 ENCOUNTER — Encounter (HOSPITAL_COMMUNITY)
Admission: EM | Disposition: A | Discharge status: Home / Self Care | Payer: Self-pay | Source: Home / Self Care | Attending: Emergency Medicine

## 2017-08-19 ENCOUNTER — Ambulatory Visit (HOSPITAL_COMMUNITY)
Admission: EM | Admit: 2017-08-19 | Discharge: 2017-08-19 | Disposition: A | Discharge status: Home / Self Care | Payer: Self-pay | Attending: Internal Medicine | Admitting: Internal Medicine

## 2017-08-19 ENCOUNTER — Observation Stay (HOSPITAL_COMMUNITY)
Admission: EM | Admit: 2017-08-19 | Discharge: 2017-08-20 | Disposition: A | Discharge status: Home / Self Care | Payer: Self-pay | Attending: Internal Medicine | Admitting: Internal Medicine

## 2017-08-19 ENCOUNTER — Encounter (HOSPITAL_COMMUNITY): Payer: Self-pay | Admitting: *Deleted

## 2017-08-19 ENCOUNTER — Emergency Department (HOSPITAL_COMMUNITY): Payer: Self-pay

## 2017-08-19 ENCOUNTER — Other Ambulatory Visit: Payer: Self-pay

## 2017-08-19 DIAGNOSIS — R9431 Abnormal electrocardiogram [ECG] [EKG]: Secondary | ICD-10-CM

## 2017-08-19 DIAGNOSIS — R0789 Other chest pain: Principal | ICD-10-CM | POA: Insufficient documentation

## 2017-08-19 DIAGNOSIS — R079 Chest pain, unspecified: Secondary | ICD-10-CM | POA: Diagnosis present

## 2017-08-19 DIAGNOSIS — I160 Hypertensive urgency: Secondary | ICD-10-CM | POA: Diagnosis present

## 2017-08-19 DIAGNOSIS — I119 Hypertensive heart disease without heart failure: Secondary | ICD-10-CM | POA: Insufficient documentation

## 2017-08-19 DIAGNOSIS — E785 Hyperlipidemia, unspecified: Secondary | ICD-10-CM | POA: Insufficient documentation

## 2017-08-19 DIAGNOSIS — Z79899 Other long term (current) drug therapy: Secondary | ICD-10-CM | POA: Insufficient documentation

## 2017-08-19 DIAGNOSIS — I10 Essential (primary) hypertension: Secondary | ICD-10-CM

## 2017-08-19 DIAGNOSIS — K219 Gastro-esophageal reflux disease without esophagitis: Secondary | ICD-10-CM | POA: Insufficient documentation

## 2017-08-19 DIAGNOSIS — I712 Thoracic aortic aneurysm, without rupture: Secondary | ICD-10-CM | POA: Insufficient documentation

## 2017-08-19 LAB — I-STAT CHEM 8, ED
BUN: 18 mg/dL (ref 6–20)
Calcium, Ion: 1.19 mmol/L (ref 1.15–1.40)
Chloride: 100 mmol/L — ABNORMAL LOW (ref 101–111)
Creatinine, Ser: 1.3 mg/dL — ABNORMAL HIGH (ref 0.61–1.24)
Glucose, Bld: 91 mg/dL (ref 65–99)
HEMATOCRIT: 47 % (ref 39.0–52.0)
HEMOGLOBIN: 16 g/dL (ref 13.0–17.0)
Potassium: 3.3 mmol/L — ABNORMAL LOW (ref 3.5–5.1)
SODIUM: 140 mmol/L (ref 135–145)
TCO2: 27 mmol/L (ref 22–32)

## 2017-08-19 LAB — CBC WITH DIFFERENTIAL/PLATELET
BASOS ABS: 0 10*3/uL (ref 0.0–0.1)
BASOS PCT: 0 %
Eosinophils Absolute: 0.2 10*3/uL (ref 0.0–0.7)
Eosinophils Relative: 2 %
HCT: 45.3 % (ref 39.0–52.0)
Hemoglobin: 15.6 g/dL (ref 13.0–17.0)
Lymphocytes Relative: 35 %
Lymphs Abs: 3 10*3/uL (ref 0.7–4.0)
MCH: 30.6 pg (ref 26.0–34.0)
MCHC: 34.4 g/dL (ref 30.0–36.0)
MCV: 89 fL (ref 78.0–100.0)
Monocytes Absolute: 0.5 10*3/uL (ref 0.1–1.0)
Monocytes Relative: 6 %
Neutro Abs: 4.9 10*3/uL (ref 1.7–7.7)
Neutrophils Relative %: 57 %
PLATELETS: 216 10*3/uL (ref 150–400)
RBC: 5.09 MIL/uL (ref 4.22–5.81)
RDW: 12.8 % (ref 11.5–15.5)
WBC: 8.6 10*3/uL (ref 4.0–10.5)

## 2017-08-19 LAB — I-STAT TROPONIN, ED: Troponin i, poc: 0 ng/mL (ref 0.00–0.08)

## 2017-08-19 LAB — URINALYSIS, ROUTINE W REFLEX MICROSCOPIC
BILIRUBIN URINE: NEGATIVE
Glucose, UA: NEGATIVE mg/dL
Hgb urine dipstick: NEGATIVE
KETONES UR: NEGATIVE mg/dL
LEUKOCYTES UA: NEGATIVE
NITRITE: NEGATIVE
PH: 7 (ref 5.0–8.0)
PROTEIN: NEGATIVE mg/dL
Specific Gravity, Urine: 1.017 (ref 1.005–1.030)

## 2017-08-19 LAB — PROTIME-INR
INR: 1.03
PROTHROMBIN TIME: 13.4 s (ref 11.4–15.2)

## 2017-08-19 LAB — RAPID URINE DRUG SCREEN, HOSP PERFORMED
Amphetamines: NOT DETECTED
BARBITURATES: NOT DETECTED
Benzodiazepines: NOT DETECTED
COCAINE: NOT DETECTED
Opiates: NOT DETECTED
Tetrahydrocannabinol: NOT DETECTED

## 2017-08-19 LAB — TROPONIN I: Troponin I: <0.03 ng/mL (ref <0.03)

## 2017-08-19 LAB — BRAIN NATRIURETIC PEPTIDE: B NATRIURETIC PEPTIDE 5: 15.1 pg/mL (ref 0.0–100.0)

## 2017-08-19 SURGERY — Surgical Case

## 2017-08-19 MED ORDER — ASPIRIN 81 MG PO CHEW
CHEWABLE_TABLET | ORAL | Status: AC
  Filled 2017-08-19: qty 4

## 2017-08-19 MED ORDER — POTASSIUM CHLORIDE CRYS ER 20 MEQ PO TBCR
20.0000 meq | EXTENDED_RELEASE_TABLET | Freq: Once | ORAL | Status: AC
  Administered 2017-08-19: 20 meq via ORAL
  Filled 2017-08-19: qty 1

## 2017-08-19 MED ORDER — NITROGLYCERIN 0.4 MG SL SUBL
SUBLINGUAL_TABLET | SUBLINGUAL | Status: AC
  Filled 2017-08-19: qty 1

## 2017-08-19 MED ORDER — GI COCKTAIL ~~LOC~~
30.0000 mL | Freq: Once | ORAL | Status: AC
Start: 2017-08-19 — End: 2017-08-19
  Administered 2017-08-19: 30 mL via ORAL
  Filled 2017-08-19: qty 30

## 2017-08-19 MED ORDER — IOPAMIDOL (ISOVUE-370) INJECTION 76%
INTRAVENOUS | Status: AC
  Filled 2017-08-19: qty 150

## 2017-08-19 MED ORDER — ENOXAPARIN SODIUM 40 MG/0.4ML ~~LOC~~ SOLN: 40.0000 mg | SUBCUTANEOUS | Status: DC

## 2017-08-19 MED ORDER — LIDOCAINE HCL (PF) 1 % IJ SOLN
INTRAMUSCULAR | Status: AC
  Filled 2017-08-19: qty 30

## 2017-08-19 MED ORDER — HYDROCHLOROTHIAZIDE 25 MG PO TABS
25.0000 mg | ORAL_TABLET | Freq: Every evening | ORAL | Status: DC
  Administered 2017-08-19 – 2017-08-20 (×2): 25 mg via ORAL
  Filled 2017-08-19 (×2): qty 1

## 2017-08-19 MED ORDER — LISINOPRIL 40 MG PO TABS
40.0000 mg | ORAL_TABLET | Freq: Every evening | ORAL | Status: DC
  Administered 2017-08-19 – 2017-08-20 (×2): 40 mg via ORAL
  Filled 2017-08-19: qty 1
  Filled 2017-08-19: qty 2

## 2017-08-19 MED ORDER — ONDANSETRON HCL 4 MG/2ML IJ SOLN: 4.0000 mg | Freq: Four times a day (QID) | INTRAMUSCULAR | Status: DC | PRN

## 2017-08-19 MED ORDER — PANTOPRAZOLE SODIUM 40 MG PO TBEC
40.0000 mg | DELAYED_RELEASE_TABLET | Freq: Every day | ORAL | Status: DC
  Administered 2017-08-20: 40 mg via ORAL
  Filled 2017-08-19: qty 1

## 2017-08-19 MED ORDER — ASPIRIN EC 325 MG PO TBEC
325.0000 mg | DELAYED_RELEASE_TABLET | Freq: Every day | ORAL | Status: DC
  Administered 2017-08-20: 325 mg via ORAL
  Filled 2017-08-19: qty 1

## 2017-08-19 MED ORDER — HEPARIN (PORCINE) IN NACL 2-0.9 UNIT/ML-% IJ SOLN
INTRAMUSCULAR | Status: AC
  Filled 2017-08-19: qty 1000

## 2017-08-19 MED ORDER — PANTOPRAZOLE SODIUM 40 MG IV SOLR
40.0000 mg | Freq: Once | INTRAVENOUS | Status: AC
  Administered 2017-08-19: 40 mg via INTRAVENOUS
  Filled 2017-08-19: qty 40

## 2017-08-19 MED ORDER — ACETAMINOPHEN 325 MG PO TABS: 650.0000 mg | ORAL_TABLET | ORAL | Status: DC | PRN

## 2017-08-19 MED ORDER — ASPIRIN 81 MG PO CHEW
324.0000 mg | CHEWABLE_TABLET | Freq: Once | ORAL | Status: AC
  Administered 2017-08-19: 324 mg via ORAL

## 2017-08-19 MED ORDER — AMLODIPINE BESYLATE 10 MG PO TABS
10.0000 mg | ORAL_TABLET | Freq: Every evening | ORAL | Status: DC
  Administered 2017-08-19 – 2017-08-20 (×2): 10 mg via ORAL
  Filled 2017-08-19: qty 1
  Filled 2017-08-19: qty 2

## 2017-08-19 NOTE — ED Notes (Signed)
One unsuccessful  Attempt to call report they will call me back

## 2017-08-19 NOTE — ED Provider Notes (Signed)
[facility] 3E CHF Provider Note   CSN: [id number] Arrival date & time: 08/19/17  1750     History   Chief Complaint Chief Complaint  Patient presents with  . Chest Pain    Craig Hart is a 62 y.o. male.  HPI  62 year old male presents emergency department history of hypertension complaining of substernal chest discomfort times 1 month worsened at night/still persistent in the morning but improves throughout the day associated with with meals.  Patient denies any recent illness, shortness of breath, lower extremity swelling/edema.  This patient presents from urgent care via EMS.  Patient was given full dose aspirin prior to arrival and is pain-free now.   Past Medical History:  Diagnosis Date  . Hypertension   . Hypertensive emergency 09/21/2014    Patient Active Problem List   Diagnosis Date Noted  . Chest pain 08/19/2017  . Hypertensive urgency 08/19/2017  . Chest pain with moderate risk for cardiac etiology   . ST elevation   . Hypokalemia 09/23/2014  . Abnormal EKG 09/23/2014  . Hyperlipidemia 09/23/2014  . Accelerated hypertension 09/21/2014    Past Surgical History:  Procedure Laterality Date  . LEFT HEART CATHETERIZATION WITH CORONARY ANGIOGRAM N/A 09/21/2014   Procedure: LEFT HEART CATHETERIZATION WITH CORONARY ANGIOGRAM;  Surgeon: Tonny Bollman, MD;  Location: [facility] CATH LAB;  Service: Cardiovascular:  minimal LAD disease.  20-30% mid RCA disease with a right dominant system.  EF 65-70%. --False activation of ST elevation MI.  . TONSILLECTOMY         Home Medications    Prior to Admission medications   Medication Sig Start Date End Date Taking? Authorizing Provider  amLODipine (NORVASC) 10 MG tablet Take 1 tablet (10 mg total) by mouth daily. Patient taking differently: Take 10 mg by mouth every evening.  09/23/14  Yes Dwana Melena, PA-C  hydrochlorothiazide (HYDRODIURIL) 25 MG tablet Take 1 tablet (25 mg total) by mouth daily. Patient  taking differently: Take 25 mg by mouth every evening.  09/23/14  Yes Dwana Melena, PA-C  lisinopril (PRINIVIL,ZESTRIL) 40 MG tablet Take 1 tablet (40 mg total) by mouth daily. Patient taking differently: Take 40 mg by mouth every evening.  09/23/14  Yes Dwana Melena, PA-C    Family History Family History  Problem Relation Age of Onset  . Hypertension Mother   . Diabetes Mother   . Hypertension Father     Social History Social History   Tobacco Use  . Smoking status: Never Smoker  . Smokeless tobacco: Never Used  Substance Use Topics  . Alcohol use: No  . Drug use: No     Allergies   Patient has no known allergies.   Review of Systems Review of Systems  Review of Systems  Constitutional: Negative for fever and chills.  HENT: Negative for ear pain, sore throat and trouble swallowing.   Eyes: Negative for pain and visual disturbance.  Respiratory: Negative for cough and shortness of breath.   Cardiovascular: see HPI Gastrointestinal: Negative for nausea, vomiting, abdominal pain and diarrhea.  Genitourinary: Negative for dysuria, urgency and frequency.  Musculoskeletal: Negative for back pain and joint swelling.  Skin: Negative for rash and wound.  Neurological: Negative for dizziness, syncope, speech difficulty, weakness and numbness.   Physical Exam Updated Vital Signs BP 134/82 (BP Location: Right Arm)   Pulse (!) 55   Temp 98 F (36.7 C) (Oral)   Resp 18   Ht 5\' 11"  (1.803 m)  Wt 98.7 kg (217 lb 11.2 oz)   SpO2 100%   BMI 30.36 kg/m   Physical Exam  Physical Exam Vitals:   08/20/17 0547 08/20/17 0802  BP: 125/78 134/82  Pulse: (!) 56 (!) 55  Resp: 16 18  Temp: 97.8 F (36.6 C) 98 F (36.7 C)  SpO2: 100% 100%   Constitutional: Patient is in no acute distress Head: Normocephalic and atraumatic.  Eyes: Extraocular motion intact, no scleral icterus Neck: Supple without meningismus, mass, or overt JVD Respiratory: Effort normal and breath  sounds normal. No respiratory distress. CV: Heart regular rate and rhythm, no obvious murmurs.  Pulses +2 and symmetric Abdomen: Soft, non-tender, non-distended MSK: Extremities are atraumatic without deformity, ROM intact Skin: Warm, dry, intact Neuro: Alert and oriented, no motor deficit noted Psychiatric: Mood and affect are normal.  ED Treatments / Results  Labs (all labs ordered are listed, but only abnormal results are displayed) Labs Reviewed  I-STAT CHEM 8, ED - Abnormal; Notable for the following components:      Result Value   Potassium 3.3 (*)    Chloride 100 (*)    Creatinine, Ser 1.30 (*)    All other components within normal limits  CBC WITH DIFFERENTIAL/PLATELET  PROTIME-INR  URINALYSIS, ROUTINE W REFLEX MICROSCOPIC  RAPID URINE DRUG SCREEN, HOSP PERFORMED  BRAIN NATRIURETIC PEPTIDE  TROPONIN I  TROPONIN I  TROPONIN I  BASIC METABOLIC PANEL  HIV ANTIBODY (ROUTINE TESTING)  CBC  I-STAT TROPONIN, ED    EKG  EKG Interpretation None       Radiology Dg Chest Portable 1 View  Result Date: 08/19/2017 CLINICAL DATA:  Patient with chest pain. EXAM: PORTABLE CHEST 1 VIEW COMPARISON:  Chest radiograph 07/30/2015. FINDINGS: Monitoring leads overlie the patient. Stable cardiac and mediastinal contours. No consolidative pulmonary opacities. No pleural effusion or pneumothorax. IMPRESSION: No acute cardiopulmonary process. Electronically Signed   By: Annia Belt M.D.   On: 08/19/2017 18:58    Procedures Procedures (including critical care time)  Medications Ordered in ED Medications  amLODipine (NORVASC) tablet 10 mg (10 mg Oral Given 08/19/17 2140)  hydrochlorothiazide (HYDRODIURIL) tablet 25 mg (25 mg Oral Given 08/19/17 2141)  lisinopril (PRINIVIL,ZESTRIL) tablet 40 mg (40 mg Oral Given 08/19/17 2140)  acetaminophen (TYLENOL) tablet 650 mg (not administered)  ondansetron (ZOFRAN) injection 4 mg (not administered)  aspirin EC tablet 325 mg (325 mg Oral Given 08/20/17  0927)  pantoprazole (PROTONIX) EC tablet 40 mg (not administered)  gi cocktail (Maalox,Lidocaine,Donnatal) (30 mLs Oral Given 08/19/17 2003)  pantoprazole (PROTONIX) injection 40 mg (40 mg Intravenous Given 08/19/17 2004)  potassium chloride SA (K-DUR,KLOR-CON) CR tablet 20 mEq (20 mEq Oral Given 08/19/17 2151)     Initial Impression / Assessment and Plan / ED Course  I have reviewed the triage vital signs and the nursing notes.  Pertinent labs & imaging results that were available during my care of the patient were reviewed by me and considered in my medical decision making (see chart for details).     62 year old male presents emergency department history of hypertension complaining of substernal chest discomfort times 1 month worsened at night/still persistent in the morning but improves throughout the day associated with with meals.  Patient denies any recent illness, shortness of breath, lower extremity swelling/edema.  This patient presents from urgent care via EMS.  Patient was given full dose aspirin prior to arrival and is pain-free now.   Physical exam as annotated above unremarkable.  Review of labs shows  no evidence of leukocytosis, stable H&H, BNP 50.1 not suspicious for congestive heart failure correlates also with chest x-ray no findings of pleural effusion/congestion.  Troponin x1-.  EKG with ST elevation to the anterior leads similar to prior EKGs.  There is no reciprocal changes.  No signs of ischemia.  Patient had a heart cath 2016 with no evidence of coronary disease.  Cardiology is rescinding the code STEMI that was initially ordered.  Plan for admission to hospitalist group for training of troponins along with possible further imaging to include echo/stress test.    Final Clinical Impressions(s) / ED Diagnoses   Final diagnoses:  Chest pain, unspecified type    ED Discharge Orders    None       Jaynie Collinsugustin, Orson Rho, DO 08/20/17 1043    Margarita Grizzleay, Danielle, MD 08/21/17 [id number]

## 2017-08-19 NOTE — ED Notes (Signed)
The pt has had mid chest discomfort at night for one month  He describes it as a burning sensation.  It has been coming and going all day today

## 2017-08-19 NOTE — ED Notes (Signed)
Report given to fred rn

## 2017-08-19 NOTE — ED Notes (Signed)
Cardiologist at the bedside cath lab here

## 2017-08-19 NOTE — ED Triage Notes (Signed)
The pt transferred from [facility] for code stemi alert oriented skin warm and dry  No chest pain on arrival

## 2017-08-19 NOTE — ED Triage Notes (Signed)
C/O waking frequently after laying down over past month with substernal chest pain that "improves after I get up and around".  Today pain is present despite not having layed down.  Denies SOB or nausea.  Pain non-radiating, non-reproduceable.

## 2017-08-19 NOTE — ED Notes (Signed)
Pt given bp meds order appeared for this pt at 2130 time to be given was 1800

## 2017-08-19 NOTE — ED Notes (Signed)
Notified [facility] of provider calling stemi and needing transport to [place]

## 2017-08-19 NOTE — H&P (Signed)
History and Physical    Craig RedWillie N Grieshop OZH:[id number]:[id number] DOB: [birth year] DOA: 08/19/2017  PCP: Patient, No Pcp Per  Patient coming from: Home.  Chief Complaint: Chest pain.  HPI: Craig Hart is a 62 y.o. male with history of hypertension who ran out of his 1 of her antihypertensives over a week ago and had flulike symptoms 3 weeks ago presents to the ER after being referred from urgent care center for chest pain.  Patient states since a flulike symptom patient has been having chest pain burning in sensation which lasts for couple of hours increased on lying down and decreased on walking.  Denies any shortness of breath.  Denies any nausea vomiting or palpitations of diaphoresis.  Patient had gone to urgent care center and over the EKG was concerning for ST elevation MI and patient was referred to the ER.  ED Course: In the ER patient EKG shows anterior ST elevations and code STEMI was called.  Dr. Herbie BaltimoreHarding, on-call cardiologist was consulted and since patient's EKG showed similar changes to 2016 and at that time also patient had a cardiac cath which was unremarkable.  Patient is presently chest pain-free.  Troponin was negative.  Cardiologist at this time has recommended admission for observation and if patient rules out MI and 2D echo is unremarkable then further GI workup probably will be necessary.  It is also noted that patient's blood pressure has been high.  Review of Systems: As per HPI, rest all negative.   Past Medical History:  Diagnosis Date  . Hypertension   . Hypertensive emergency 09/21/2014    Past Surgical History:  Procedure Laterality Date  . LEFT HEART CATHETERIZATION WITH CORONARY ANGIOGRAM N/A 09/21/2014   Procedure: LEFT HEART CATHETERIZATION WITH CORONARY ANGIOGRAM;  Surgeon: Tonny BollmanMichael Cooper, MD;  [facility];  Service: Cardiovascular:  minimal LAD disease.  20-30% mid RCA disease with a right dominant system.  EF 65-70%. --False activation of ST elevation MI.   . TONSILLECTOMY       reports that  has never smoked. he has never used smokeless tobacco. He reports that he does not drink alcohol or use drugs.  No Known Allergies  Family History  Problem Relation Age of Onset  . Hypertension Mother   . Diabetes Mother   . Hypertension Father     Prior to Admission medications   Medication Sig Start Date End Date Taking? Authorizing Provider  amLODipine (NORVASC) 10 MG tablet Take 1 tablet (10 mg total) by mouth daily. Patient taking differently: Take 10 mg by mouth every evening.  09/23/14  Yes Dwana MelenaHager, Bryan W, PA-C  hydrochlorothiazide (HYDRODIURIL) 25 MG tablet Take 1 tablet (25 mg total) by mouth daily. Patient taking differently: Take 25 mg by mouth every evening.  09/23/14  Yes Dwana MelenaHager, Bryan W, PA-C  lisinopril (PRINIVIL,ZESTRIL) 40 MG tablet Take 1 tablet (40 mg total) by mouth daily. Patient taking differently: Take 40 mg by mouth every evening.  09/23/14  Yes Dwana MelenaHager, Bryan W, PA-C    Physical Exam: Vitals:   08/19/17 1900 08/19/17 1930 08/19/17 2000 08/19/17 2030  BP: (!) 164/92 (!) 164/101 (!) 158/119 (!) 181/111  Pulse: 74 (!) 57 64 60  Resp: 20 13 18 14   Temp:      SpO2: 94% 100% 97% 100%  Weight:      Height:          Constitutional: Moderately built and nourished. Vitals:   08/19/17 1900 08/19/17 1930 08/19/17 2000 08/19/17 2030  BP: (!) 164/92 (!) 164/101 (!) 158/119 (!) 181/111  Pulse: 74 (!) 57 64 60  Resp: 20 13 18 14   Temp:      SpO2: 94% 100% 97% 100%  Weight:      Height:       Eyes: Anicteric no pallor. ENMT: No discharge from the ears eyes nose or mouth. Neck: No mass felt.  No neck rigidity.  No JVD appreciated. Respiratory: No rhonchi or crepitations. Cardiovascular: S1-S2 heard no murmurs appreciated. Abdomen: Soft nontender bowel sounds present.  No guarding or rigidity. Musculoskeletal: No edema.  No joint effusion. Skin: No rash. Neurologic: Alert awake oriented to time place and person.  Moves all  extremities. Psychiatric: Appears normal.  Normal affect.   Labs on Admission: I have personally reviewed following labs and imaging studies  CBC: Recent Labs  Lab 08/19/17 1832 08/19/17 1841  WBC 8.6  --   NEUTROABS 4.9  --   HGB 15.6 16.0  HCT 45.3 47.0  MCV 89.0  --   PLT 216  --    Basic Metabolic Panel: Recent Labs  Lab 08/19/17 1841  NA 140  K 3.3*  CL 100*  GLUCOSE 91  BUN 18  CREATININE 1.30*   GFR: Estimated Creatinine Clearance: 72.4 mL/min (A) (by C-G formula based on SCr of 1.3 mg/dL (H)). Liver Function Tests: No results for input(s): AST, ALT, ALKPHOS, BILITOT, PROT, ALBUMIN in the last 168 hours. No results for input(s): LIPASE, AMYLASE in the last 168 hours. No results for input(s): AMMONIA in the last 168 hours. Coagulation Profile: Recent Labs  Lab 08/19/17 1832  INR 1.03   Cardiac Enzymes: No results for input(s): CKTOTAL, CKMB, CKMBINDEX, TROPONINI in the last 168 hours. BNP (last 3 results) No results for input(s): PROBNP in the last 8760 hours. HbA1C: No results for input(s): HGBA1C in the last 72 hours. CBG: No results for input(s): GLUCAP in the last 168 hours. Lipid Profile: No results for input(s): CHOL, HDL, LDLCALC, TRIG, CHOLHDL, LDLDIRECT in the last 72 hours. Thyroid Function Tests: No results for input(s): TSH, T4TOTAL, FREET4, T3FREE, THYROIDAB in the last 72 hours. Anemia Panel: No results for input(s): VITAMINB12, FOLATE, FERRITIN, TIBC, IRON, RETICCTPCT in the last 72 hours. Urine analysis:    Component Value Date/Time   COLORURINE YELLOW 05/10/2013 1422   APPEARANCEUR CLEAR 05/10/2013 1422   LABSPEC 1.022 05/10/2013 1422   PHURINE 6.5 05/10/2013 1422   GLUCOSEU NEGATIVE 05/10/2013 1422   HGBUR NEGATIVE 05/10/2013 1422   BILIRUBINUR NEGATIVE 05/10/2013 1422   KETONESUR NEGATIVE 05/10/2013 1422   PROTEINUR NEGATIVE 05/10/2013 1422   UROBILINOGEN 1.0 05/10/2013 1422   NITRITE NEGATIVE 05/10/2013 1422   LEUKOCYTESUR  NEGATIVE 05/10/2013 1422   Sepsis Labs: @LABRCNTIP (procalcitonin:4,lacticidven:4) )No results found for this or any previous visit (from the past 240 hour(s)).   Radiological Exams on Admission: Dg Chest Portable 1 View  Result Date: 08/19/2017 CLINICAL DATA:  Patient with chest pain. EXAM: PORTABLE CHEST 1 VIEW COMPARISON:  Chest radiograph 07/30/2015. FINDINGS: Monitoring leads overlie the patient. Stable cardiac and mediastinal contours. No consolidative pulmonary opacities. No pleural effusion or pneumothorax. IMPRESSION: No acute cardiopulmonary process. Electronically Signed   By: Annia Belt M.D.   On: 08/19/2017 18:58    EKG: Independently reviewed.  Sinus rhythm with ST-T changes in anterior and inferior leads.  Assessment/Plan Principal Problem:   Chest pain Active Problems:   Hypertensive urgency    1. Chest pain -appreciate cardiology consult.  Since EKG changes comparable  to the old EKG in 2016 at this time cardiologist has canceled code STEMI.  If patient's rule out MI then other differentials would be pericarditis versus GI/GERD.  We will cycle cardiac markers check 2D echo.  Patient also was given GI cocktail.  Probably uncontrolled blood pressure contributing to patient's symptoms. 2. Hypertensive urgency -patient has ran out of 1 of his medications last week.  Not able to tell exact name of it.  Patient has not taken any of his antihypertensives today.  We will give with now.  And closely follow blood pressure trends.  PRN IV hydralazine will be ordered for systolic more than 160 after giving the oral antihypertensives.   DVT prophylaxis: SCDs since pericarditis in the differential diagnosis. Code Status: Full code. Family Communication: Discussed with family. Disposition Plan: Home. Consults called: Cardiology. Admission status: Observation.   Eduard Clos MD [facility] Pager [phone].  If 7PM-7AM, please contact  night-coverage [email or web address] Password [facility]  08/19/2017, 9:19 PM

## 2017-08-19 NOTE — Consult Note (Signed)
Interventional Cardiology Consultation:   Patient ID: Craig Hart; [id number]; 10/18/1955   Admit date: 08/19/2017 Date of Consult: 08/19/2017  Primary Care Provider: Patient, No Pcp Per Primary Cardiologist:  Primary Electrophysiologist: None   Patient Profile:   Craig Hart is a 62 y.o. male with a hx of abnormal EKG with anterior ST elevations at baseline who is being seen today for the evaluation of possible ST elevation MI with resting chest pain and persistent ST changes at the request of Dr. Margarita Grizzleanielle Ray -  EDP  History of Present Illness:   Craig Hart has a history of hypertension with hypertensive emergency and abnormal EKG noted to have anterior lateral ST elevations on EKG dating back to April 2016.  At that time he was admitted with chest pain and code STEMI was called. he was taken emergently to cardiac catheterization lab by Dr. Tonny BollmanMichael Cooper and this revealed only mild luminal irregularities in the LAD in 2030% disease in the RCA.  He had normal EF of 65-70%.  At that time he had minimal troponin elevation and was thought to be more of hypertensive emergency instead of a true STEMI. He did not follow-up with a cardiologist at that time.  Will he noted that he had a viral cold-like illness about 2-3 weeks ago.  And since then he has been having persistent episodes of substernal upper chest burning sensation that usually has been going on at night when he lies down.  It is also associated with after eating.  It is not necessarily associated with any particular activity or exercise.  Simply moving around sometimes makes it worse, but it is usually worse after eating.  He has not noted any other symptoms of dyspnea, diaphoresis or weakness.  He presented to urgent care earlier today at roughly 5-5:30 PM because he had recurrence of his pain today that is been off and on all day long.  Because of his EKG, code STEMI was called and he was transferred to [facility] emergency  room.  Upon arrival to emergency room he is chest pain-free.  I reviewed his EKG and it appears to be essentially unchanged when compared to prior EKGs.  He is hypertensive, but he has missed his and hypertensive agents for the last week stating he ran out of the pill.  In the ER he has been given 304 mg aspirin, but has not been given heparin.  Clinically he looks and feels stable.   Past Medical History:  Diagnosis Date  . Hypertension   . Hypertensive emergency 09/21/2014    Past Surgical History:  Procedure Laterality Date  . LEFT HEART CATHETERIZATION WITH CORONARY ANGIOGRAM N/A 09/21/2014   Procedure: LEFT HEART CATHETERIZATION WITH CORONARY ANGIOGRAM;  Surgeon: Tonny BollmanMichael Cooper, MD;  Location: [facility] CATH LAB;  Service: Cardiovascular:  minimal LAD disease.  20-30% mid RCA disease with a right dominant system.  EF 65-70%. --False activation of ST elevation MI.  . TONSILLECTOMY       Home Medications:  Prior to Admission medications   Medication Sig Start Date End Date Taking? Authorizing Provider  amLODipine (NORVASC) 10 MG tablet Take 1 tablet (10 mg total) by mouth daily. 09/23/14   Dwana MelenaHager, Bryan W, PA-C  hydrochlorothiazide (HYDRODIURIL) 25 MG tablet Take 1 tablet (25 mg total) by mouth daily. 09/23/14   Dwana MelenaHager, Bryan W, PA-C  lisinopril (PRINIVIL,ZESTRIL) 40 MG tablet Take 1 tablet (40 mg total) by mouth daily. 09/23/14   Dwana MelenaHager, Bryan W, PA-C  -Heat he  has been out of at least 1 of these pills for the last week.  Inpatient Medications: . gi cocktail  30 mL Oral Once  . pantoprazole (PROTONIX) IV  40 mg Intravenous Once    Allergies:   No Known Allergies  Social History:   Social History   Socioeconomic History  . Marital status: Divorced    Spouse name: Not on file  . Number of children: Not on file  . Years of education: Not on file  . Highest education level: Not on file  Social Needs  . Financial resource strain: Not on file  . Food insecurity - worry: Not on file  . Food  insecurity - inability: Not on file  . Transportation needs - medical: Not on file  . Transportation needs - non-medical: Not on file  Occupational History  . Not on file  Tobacco Use  . Smoking status: Never Smoker  . Smokeless tobacco: Never Used  Substance and Sexual Activity  . Alcohol use: No  . Drug use: No  . Sexual activity: Yes  Other Topics Concern  . Not on file  Social History Narrative  . Not on file    Family History:   Family History  Problem Relation Age of Onset  . Hypertension Mother   . Diabetes Mother   . Hypertension Father      ROS: Please see the history of present illness.  Review of Systems  Constitutional: Negative for malaise/fatigue.  HENT: Negative for congestion, nosebleeds and sore throat.   Respiratory: Negative for cough and shortness of breath.        Recovered from viral illness roughly 2 weeks ago -no further cold, fevers, chills  Cardiovascular: Positive for chest pain. Negative for palpitations and leg swelling.  Gastrointestinal: Positive for heartburn. Negative for blood in stool, constipation and melena.  Genitourinary: Negative for hematuria.  Neurological: Negative for dizziness.  All other systems reviewed and are negative.   Physical Exam/Data:   Vitals:   08/19/17 1755 08/19/17 1759 08/19/17 1809  BP: (!) 166/98  (!) 168/105  Pulse: 82  72  Resp: 16  17  Temp: 98.1 F (36.7 C)    SpO2: 97%  98%  Weight:  218 lb (98.9 kg)   Height:  5\' 11"  (1.803 m)    No intake or output data in the 24 hours ending 08/19/17 1834 Filed Weights   08/19/17 1759  Weight: 218 lb (98.9 kg)   Body mass index is 30.4 kg/m.  General:  Well nourished, well developed, in no acute distress -actually resting comfortably in bed. HEENT: NCAT, EOMI. Lymph: no adenopathy Neck: no JVD OR CAROTID BRUIT Endocrine:  No thryomegaly Vascular: No carotid bruits; FA pulses 2+ bilaterally without bruits  Cardiac:  normal S1, S2; RRR; no murmur (? S4,  but no S3).  No rub noted  Lungs:  clear to auscultation bilaterally, no wheezing, rhonchi or rales  Abd: soft, nontender, no hepatomegaly  Ext: no edema Musculoskeletal:  No deformities, BUE and BLE strength normal and equal Skin: warm and dry  Neuro:  CNs 2-12 intact, no focal abnormalities noted Psych:  Normal affect   EKG:  The EKG was personally reviewed and demonstrates: Normal sinus rhythm, rate 68 bpm.  There is ST elevation noted in the 2, V3 and subtle elevation in V4-V6 as well as I and II -consider early repolarization, peritonitis or injury pattern.  Otherwise nonspecific ST and T wave changes.  Follow-up EKG shows sinus rhythm,  rate 75 bpm.  Similar ST segment changes.  Review of EKG from February 2017: Sinus rhythm with ST segment elevations also noted in I, II, aVF, V2-V3 with more subtle changes in V4-V6 (very similar to current EKG)  Reviewed of EKG from September 22, 2014 (time of his last catheterization) -again sinus rhythm, rate 61 bpm.  More pronounced ST elevations in I, 2, 3, aVF as well as V2 through V6.  Telemetry:  Telemetry was personally reviewed and demonstrates:  NSR  Relevant CV Studies:  Cardiac cath from April 2016 reviewed (see past surgical history. 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  (Notably done for similar EKG changes)   Laboratory Data:  ChemistryNo results for input(s): NA, K, CL, CO2, GLUCOSE, BUN, CREATININE, CALCIUM, GFRNONAA, GFRAA, ANIONGAP in the last 168 hours.  No results for input(s): PROT, ALBUMIN, AST, ALT, ALKPHOS, BILITOT in the last 168 hours. HematologyNo results for input(s): WBC, RBC, HGB, HCT, MCV, MCH, MCHC, RDW, PLT in the last 168 hours. Cardiac EnzymesNo results for input(s): TROPONINI in the last 168 hours. No results for input(s): TROPIPOC in the last 168 hours.  BNPNo results for input(s): BNP, PROBNP in the last 168 hours.  DDimer No results for input(s): DDIMER in the last 168 hours.  Radiology/Studies:  No  results found.  Assessment and Plan:   Mr. Delorenzo presents here today with chest pain that sounds more consistent with GERD/GI symptoms versus possible pericarditis.  His EKG is essentially unchanged from 3 years ago at which time he had a heart catheterization showing normal coronary arteries.  Since he is currently chest pain-free, and his symptoms have been going off and on for the last 2-3 weeks, I think is reasonable to cancel code STEMI at this time in the absence of ongoing chest pain.  He does not clinically appear to be a patient having anterior ST elevation MI.  I had a talk with him, he feels comfortable with this decision as he is thinking that this is more related to GERD than anything else.  I would recommend ruling out MI with troponin levels and consider checking a 2D echocardiogram prior to discharge just to exclude pericarditis since he had onset of symptoms shortly after a viral illness.  If he rules out for MI, and echocardiogram is normal, would pursue GI evaluation to exclude significant GERD or other more concerning conditions.  If the decision is made to admit the patient cardiology will be happy to follow along in consultation.  Please notify on-call fellow to add the patient to our list.  For questions or updates, please contact [facility] Please consult [email or web address] for contact info under Cardiology/STEMI.   Signed, Bryan Lemma, MD  08/19/2017 6:34 PM

## 2017-08-19 NOTE — ED Triage Notes (Signed)
Also reports running out of one HTN meds approx 1 wk ago.

## 2017-08-19 NOTE — ED Notes (Signed)
Pt asking about bp med again he was supposed to take around 1700  No orders for meds

## 2017-08-19 NOTE — ED Provider Notes (Signed)
08/19/2017 5:33 PM   DOB: 12-24-1955 / MRN: [id number]  SUBJECTIVE:  Craig Hart is a 62 y.o. male presenting for pain in the chest.  This has been somewhat persistent over the last 2-3 weeks and has been worsening over the last 24 hours. He denies SOB, a radicular pattern, nausea, emesis. It appears that he has had pain in the past along with negative troponins as well as abnormal EKGs in the past. His trops were negative at that time. Despite this this findings he is not followed by cardiologist. He has been out of his medication for the last week.         He has No Known Allergies.   He  has a past medical history of Hypertension and Hypertensive emergency (09/21/2014).    He  reports that  has never smoked. he has never used smokeless tobacco. He reports that he does not drink alcohol or use drugs. He  reports that he currently engages in sexual activity. The patient  has a past surgical history that includes Tonsillectomy; left heart catheterization with coronary angiogram (N/A, 09/21/2014); and Cardiac catheterization (09/21/2014).  His family history includes Diabetes in his mother; Hypertension in his father and mother.  Review of Systems  Respiratory: Negative for cough, hemoptysis, sputum production, shortness of breath and wheezing.   Cardiovascular: Positive for chest pain. Negative for leg swelling and PND.  Skin: Negative for rash.    OBJECTIVE:  BP (!) 170/92   Pulse 68   Temp 98.1 F (36.7 C) (Oral)   Resp 18   SpO2 99%   Physical Exam  Constitutional: He is oriented to person, place, and time. He appears well-developed. He is active and cooperative.  Non-toxic appearance.  Cardiovascular: Normal rate, regular rhythm, S1 normal, S2 normal, normal heart sounds, intact distal pulses and normal pulses. Exam reveals no gallop and no friction rub.  No murmur heard. Pulmonary/Chest: Effort normal. No stridor. No tachypnea. No respiratory distress. He has no wheezes. He has no  rales.  Abdominal: He exhibits no distension.  Musculoskeletal: Normal range of motion. He exhibits no edema.  Neurological: He is alert and oriented to person, place, and time.  Skin: Skin is warm and dry. He is not diaphoretic. No pallor.  Vitals reviewed.   No results found for this or any previous visit (from the past 72 hour(s)).  No results found.  ASSESSMENT AND PLAN:  Orders Placed This Encounter  Procedures  . EKG 12-Lead    Standing Status:   Standing    Number of Occurrences:   1    Order Specific Question:   Reason for Exam    Answer:   chest pain  . EKG 12-Lead    Standing Status:   Standing    Number of Occurrences:   1     ST elevation: Patient with a history HTN with mild chest pain and ST elevation about the septal leads. He pain has been present for weeks now.  We have given him 324 of ASA. He is not having pain at this moment thus I have not given him Nitro.  He needs stat troponins. We are sending him to the ED.  He needs to be followed by cardiology.   Chest pain, unspecified type      The patient is advised to call or return to clinic if he does not see an improvement in symptoms, or to seek the care of the closest emergency department if he worsens with  the above plan.   Deliah BostonMichael Aemilia Dedrick, MHS, PA-C 08/19/2017 5:33 PM   [name], [name], PA-C 08/19/17 1739

## 2017-08-19 NOTE — ED Notes (Signed)
Notified charge nurse -jamie, charge rn

## 2017-08-20 ENCOUNTER — Observation Stay (HOSPITAL_BASED_OUTPATIENT_CLINIC_OR_DEPARTMENT_OTHER): Payer: Self-pay

## 2017-08-20 ENCOUNTER — Observation Stay (HOSPITAL_COMMUNITY): Payer: Self-pay

## 2017-08-20 ENCOUNTER — Other Ambulatory Visit (HOSPITAL_COMMUNITY): Payer: Self-pay

## 2017-08-20 DIAGNOSIS — I5030 Unspecified diastolic (congestive) heart failure: Secondary | ICD-10-CM

## 2017-08-20 DIAGNOSIS — I160 Hypertensive urgency: Secondary | ICD-10-CM

## 2017-08-20 DIAGNOSIS — K219 Gastro-esophageal reflux disease without esophagitis: Secondary | ICD-10-CM

## 2017-08-20 LAB — CBC
HCT: 45.3 % (ref 39.0–52.0)
HEMOGLOBIN: 15.2 g/dL (ref 13.0–17.0)
MCH: 29.9 pg (ref 26.0–34.0)
MCHC: 33.6 g/dL (ref 30.0–36.0)
MCV: 89.2 fL (ref 78.0–100.0)
Platelets: 203 10*3/uL (ref 150–400)
RBC: 5.08 MIL/uL (ref 4.22–5.81)
RDW: 12.9 % (ref 11.5–15.5)
WBC: 7 10*3/uL (ref 4.0–10.5)

## 2017-08-20 LAB — BASIC METABOLIC PANEL
Anion gap: 11 (ref 5–15)
BUN: 15 mg/dL (ref 6–20)
CALCIUM: 9.3 mg/dL (ref 8.9–10.3)
CO2: 25 mmol/L (ref 22–32)
CREATININE: 1.21 mg/dL (ref 0.61–1.24)
Chloride: 101 mmol/L (ref 101–111)
GFR calc Af Amer: >60 mL/min (ref >60)
GFR calc non Af Amer: >60 mL/min (ref >60)
Glucose, Bld: 91 mg/dL (ref 65–99)
Potassium: 3.6 mmol/L (ref 3.5–5.1)
SODIUM: 137 mmol/L (ref 135–145)

## 2017-08-20 LAB — ECHOCARDIOGRAM COMPLETE
Height: 71 in
WEIGHTICAEL: 3483.2 [oz_av]

## 2017-08-20 LAB — TROPONIN I

## 2017-08-20 LAB — HIV ANTIBODY (ROUTINE TESTING W REFLEX): HIV Screen 4th Generation wRfx: NONREACTIVE

## 2017-08-20 MED ORDER — AMLODIPINE BESYLATE 10 MG PO TABS: 10.0000 mg | ORAL_TABLET | Freq: Every evening | ORAL | 0 refills | Status: DC

## 2017-08-20 MED ORDER — PANTOPRAZOLE SODIUM 40 MG PO TBEC: 40.0000 mg | DELAYED_RELEASE_TABLET | Freq: Two times a day (BID) | ORAL | 0 refills | Status: DC

## 2017-08-20 MED ORDER — NITROGLYCERIN 0.4 MG SL SUBL
0.8000 mg | SUBLINGUAL_TABLET | Freq: Once | SUBLINGUAL | Status: AC
  Administered 2017-08-20: 0.8 mg via SUBLINGUAL

## 2017-08-20 MED ORDER — PANTOPRAZOLE SODIUM 40 MG PO TBEC
40.0000 mg | DELAYED_RELEASE_TABLET | Freq: Two times a day (BID) | ORAL | Status: DC
  Administered 2017-08-20: 40 mg via ORAL
  Filled 2017-08-20: qty 1

## 2017-08-20 MED ORDER — NITROGLYCERIN 0.4 MG SL SUBL
SUBLINGUAL_TABLET | SUBLINGUAL | Status: AC
  Filled 2017-08-20: qty 2

## 2017-08-20 MED ORDER — HYDROCHLOROTHIAZIDE 25 MG PO TABS: 25.0000 mg | ORAL_TABLET | Freq: Every evening | ORAL | 0 refills | Status: DC

## 2017-08-20 MED ORDER — LISINOPRIL 40 MG PO TABS: 40.0000 mg | ORAL_TABLET | Freq: Every evening | ORAL | 0 refills | Status: DC

## 2017-08-20 MED ORDER — IOPAMIDOL (ISOVUE-370) INJECTION 76%
INTRAVENOUS | Status: AC
  Administered 2017-08-20: 80 mL
  Filled 2017-08-20: qty 100

## 2017-08-20 NOTE — Progress Notes (Addendum)
PROGRESS NOTE    Craig RedWillie N Roskelley  WGN:[id number] DOB: July 02, 1955 DOA: 08/19/2017 PCP: Verlon AuBoyd, Tammy Lamonica, MD   Outpatient Specialists:     Brief Narrative:  Craig Hart is a 62 y.o. male with history of hypertension who ran out of his 1 of her antihypertensives over a week ago and had flulike symptoms 3 weeks ago presents to the ER after being referred from urgent care center for chest pain.  Patient states since a flulike symptom patient has been having chest pain burning in sensation which lasts for couple of hours increased on lying down and decreased on walking.  Denies any shortness of breath.  Denies any nausea vomiting or palpitations of diaphoresis.  Patient had gone to urgent care center and over the EKG was concerning for ST elevation MI and patient was referred to the ER.     Assessment & Plan:   Principal Problem:   Chest pain Active Problems:   Hypertensive urgency   Chest pain -appreciate cardiology consult -EKG changes similar to prior -CE negative -suspect GI source-- improved with GI coctail and will need GI referral -echo pending appreciate cardiology consult--- plan for coronary CTA  Hypertensive urgency -ran out of 1 of his medications last week.  -resume home meds    DVT prophylaxis:  SCD's- patient refusing  Code Status: Full Code   Family Communication:   Disposition Plan:  Home after echo?   Consultants:   cards     Subjective: Some chest discomfort but not pain  Objective: Vitals:   08/19/17 2200 08/19/17 2244 08/20/17 0547 08/20/17 0802  BP: (!) 168/98 (!) 167/91 125/78 134/82  Pulse: 61 (!) 53 (!) 56 (!) 55  Resp: 12 16 16 18   Temp:  97.7 F (36.5 C) 97.8 F (36.6 C) 98 F (36.7 C)  TempSrc:  Oral Oral Oral  SpO2: 96% 96% 100% 100%  Weight:  100.1 kg (220 lb 11.2 oz) 98.7 kg (217 lb 11.2 oz)   Height:  5\' 11"  (1.803 m)      Intake/Output Summary (Last 24 hours) at 08/20/2017 1116 Last data filed at 08/20/2017  0600 Gross per 24 hour  Intake 180 ml  Output 800 ml  Net -620 ml   Filed Weights   08/19/17 1759 08/19/17 2244 08/20/17 0547  Weight: 98.9 kg (218 lb) 100.1 kg (220 lb 11.2 oz) 98.7 kg (217 lb 11.2 oz)    Examination:  General exam: Appears calm and comfortable  Respiratory system: Clear to auscultation. Respiratory effort normal. Cardiovascular system: S1 & S2 heard, RRR. No pedal edema. Gastrointestinal system: Abdomen is nondistended, soft and nontender. No organomegaly or masses felt. Normal bowel sounds heard. Central nervous system: Alert and oriented. No focal neurological deficits.     Data Reviewed: I have personally reviewed following labs and imaging studies  CBC: Recent Labs  Lab 08/19/17 1832 08/19/17 1841 08/20/17 0911  WBC 8.6  --  7.0  NEUTROABS 4.9  --   --   HGB 15.6 16.0 15.2  HCT 45.3 47.0 45.3  MCV 89.0  --  89.2  PLT 216  --  203   Basic Metabolic Panel: Recent Labs  Lab 08/19/17 1841 08/20/17 0911  NA 140 137  K 3.3* 3.6  CL 100* 101  CO2  --  25  GLUCOSE 91 91  BUN 18 15  CREATININE 1.30* 1.21  CALCIUM  --  9.3   GFR: Estimated Creatinine Clearance: 77.8 mL/min (by C-G formula based on SCr  of 1.21 mg/dL). Liver Function Tests: No results for input(s): AST, ALT, ALKPHOS, BILITOT, PROT, ALBUMIN in the last 168 hours. No results for input(s): LIPASE, AMYLASE in the last 168 hours. No results for input(s): AMMONIA in the last 168 hours. Coagulation Profile: Recent Labs  Lab 08/19/17 1832  INR 1.03   Cardiac Enzymes: Recent Labs  Lab 08/19/17 2208 08/20/17 0246 08/20/17 0911  TROPONINI <0.03 <0.03 <0.03   BNP (last 3 results) No results for input(s): PROBNP in the last 8760 hours. HbA1C: No results for input(s): HGBA1C in the last 72 hours. CBG: No results for input(s): GLUCAP in the last 168 hours. Lipid Profile: No results for input(s): CHOL, HDL, LDLCALC, TRIG, CHOLHDL, LDLDIRECT in the last 72 hours. Thyroid  Function Tests: No results for input(s): TSH, T4TOTAL, FREET4, T3FREE, THYROIDAB in the last 72 hours. Anemia Panel: No results for input(s): VITAMINB12, FOLATE, FERRITIN, TIBC, IRON, RETICCTPCT in the last 72 hours. Urine analysis:    Component Value Date/Time   COLORURINE YELLOW 08/19/2017 1820   APPEARANCEUR CLEAR 08/19/2017 1820   LABSPEC 1.017 08/19/2017 1820   PHURINE 7.0 08/19/2017 1820   GLUCOSEU NEGATIVE 08/19/2017 1820   HGBUR NEGATIVE 08/19/2017 1820   BILIRUBINUR NEGATIVE 08/19/2017 1820   KETONESUR NEGATIVE 08/19/2017 1820   PROTEINUR NEGATIVE 08/19/2017 1820   UROBILINOGEN 1.0 05/10/2013 1422   NITRITE NEGATIVE 08/19/2017 1820   LEUKOCYTESUR NEGATIVE 08/19/2017 1820     )No results found for this or any previous visit (from the past 240 hour(s)).    Anti-infectives (From admission, onward)   None       Radiology Studies: Dg Chest Portable 1 View  Result Date: 08/19/2017 CLINICAL DATA:  Patient with chest pain. EXAM: PORTABLE CHEST 1 VIEW COMPARISON:  Chest radiograph 07/30/2015. FINDINGS: Monitoring leads overlie the patient. Stable cardiac and mediastinal contours. No consolidative pulmonary opacities. No pleural effusion or pneumothorax. IMPRESSION: No acute cardiopulmonary process. Electronically Signed   By: Annia Belt M.D.   On: 08/19/2017 18:58        Scheduled Meds: . amLODipine  10 mg Oral QPM  . aspirin EC  325 mg Oral Daily  . hydrochlorothiazide  25 mg Oral QPM  . lisinopril  40 mg Oral QPM  . pantoprazole  40 mg Oral BID AC   Continuous Infusions:   LOS: 0 days    Time spent: 35 min    Joseph Art, DO [facility] Pager [phone]  If 7PM-7AM, please contact night-coverage [email or web address] Password [email or web address] 08/20/2017, 11:16 AM

## 2017-08-20 NOTE — Plan of Care (Signed)
  Progressing Education: Knowledge of General Education information will improve 08/20/2017 1030 - Progressing by Forbes Cellarraddock, Manley Fason W, RN Health Behavior/Discharge Planning: Ability to manage health-related needs will improve 08/20/2017 1030 - Progressing by Forbes Cellarraddock, Kleber Crean W, RN Clinical Measurements: Ability to maintain clinical measurements within normal limits will improve 08/20/2017 1030 - Progressing by Forbes Cellarraddock, Onie Kasparek W, RN Activity: Risk for activity intolerance will decrease 08/20/2017 1030 - Progressing by Forbes Cellarraddock, Manning Luna W, RN Nutrition: Adequate nutrition will be maintained 08/20/2017 1030 - Progressing by Forbes Cellarraddock, Vina Byrd W, RN Pain Managment: General experience of comfort will improve 08/20/2017 1030 - Progressing by Forbes Cellarraddock, Tieara Flitton W, RN

## 2017-08-20 NOTE — Progress Notes (Signed)
Cardiac CTA with patent coronary arteries.  Mild dilatation of ascending aorta.  Ok for discharge.

## 2017-08-20 NOTE — Progress Notes (Signed)
Pt refused SCDs. Pt is alert and oriented x4 and independent.

## 2017-08-20 NOTE — Progress Notes (Signed)
  Echocardiogram 2D Echocardiogram has been performed.  Craig Hart T Craig Hart 08/20/2017, 12:11 PM

## 2017-08-20 NOTE — Progress Notes (Addendum)
Progress Note  Patient Name: Craig Hart Date of Encounter: 08/20/2017  Primary Cardiologist: New to [facility]; Dr. Elease Hashimoto  Subjective   Patient c/o CP upon waking this morning around 6am which he describes as a dull central/lower chest pain. States this pain is the same as what he has been experiencing for the past several weeks. Non-radiating, without associated diaphoresis, SOB, N/V, dizziness, or lightheadedness. States the pain lasted 30 minutes and resolved spontaneously. Currently CP free and without complaints. Eating breakfast at the time of exam.   Inpatient Medications    Scheduled Meds: . amLODipine  10 mg Oral QPM  . aspirin EC  325 mg Oral Daily  . hydrochlorothiazide  25 mg Oral QPM  . lisinopril  40 mg Oral QPM  . pantoprazole  40 mg Oral BID AC   Continuous Infusions:  PRN Meds: acetaminophen, ondansetron (ZOFRAN) IV   Vital Signs    Vitals:   08/19/17 2200 08/19/17 2244 08/20/17 0547 08/20/17 0802  BP: (!) 168/98 (!) 167/91 125/78 134/82  Pulse: 61 (!) 53 (!) 56 (!) 55  Resp: 12 16 16 18   Temp:  97.7 F (36.5 C) 97.8 F (36.6 C) 98 F (36.7 C)  TempSrc:  Oral Oral Oral  SpO2: 96% 96% 100% 100%  Weight:  220 lb 11.2 oz (100.1 kg) 217 lb 11.2 oz (98.7 kg)   Height:  5\' 11"  (1.803 m)      Intake/Output Summary (Last 24 hours) at 08/20/2017 1039 Last data filed at 08/20/2017 0600 Gross per 24 hour  Intake 180 ml  Output 800 ml  Net -620 ml   Filed Weights   08/19/17 1759 08/19/17 2244 08/20/17 0547  Weight: 218 lb (98.9 kg) 220 lb 11.2 oz (100.1 kg) 217 lb 11.2 oz (98.7 kg)    Telemetry    NSR with episodes of sinus brady - Personally Reviewed  Physical Exam   GEN: Well developed, well nourished AAM sitting on the edge of his bed in no acute distress.   Neck: No JVD, no carotid bruits Cardiac: RRR, +murmur (right sternal border), no rubs or gallops. No chest wall TTP  Respiratory: Clear to auscultation bilaterally, no wheezes/  rales/ rhonchi GI: NABS, Soft, nontender (no epigastric TTP), non-distended  MS: No edema; No deformity. Neuro:  Nonfocal, moving all extremities spontaneously Psych: Normal affect   Labs    Chemistry Recent Labs  Lab 08/19/17 1841 08/20/17 0911  NA 140 137  K 3.3* 3.6  CL 100* 101  CO2  --  25  GLUCOSE 91 91  BUN 18 15  CREATININE 1.30* 1.21  CALCIUM  --  9.3  GFRNONAA  --  >60  GFRAA  --  >60  ANIONGAP  --  11     Hematology Recent Labs  Lab 08/19/17 1832 08/19/17 1841  WBC 8.6  --   RBC 5.09  --   HGB 15.6 16.0  HCT 45.3 47.0  MCV 89.0  --   MCH 30.6  --   MCHC 34.4  --   RDW 12.8  --   PLT 216  --     Cardiac Enzymes Recent Labs  Lab 08/19/17 2208 08/20/17 0246 08/20/17 0911  TROPONINI <0.03 <0.03 <0.03    Recent Labs  Lab 08/19/17 1839  TROPIPOC 0.00     BNP Recent Labs  Lab 08/19/17 1832  BNP 15.1     DDimer No results for input(s): DDIMER in the last 168 hours.   Radiology  Dg Chest Portable 1 View  Result Date: 08/19/2017 CLINICAL DATA:  Patient with chest pain. EXAM: PORTABLE CHEST 1 VIEW COMPARISON:  Chest radiograph 07/30/2015. FINDINGS: Monitoring leads overlie the patient. Stable cardiac and mediastinal contours. No consolidative pulmonary opacities. No pleural effusion or pneumothorax. IMPRESSION: No acute cardiopulmonary process. Electronically Signed   By: Annia Beltrew  Davis M.D.   On: 08/19/2017 18:58    Cardiac Studies   Cardiac catheterization 09/2014: Coronary angiography: Coronary dominance: right  Left mainstem: Widely patent vessel with no obstructive disease. Divides into the LAD and left circumflex.  Left anterior descending (LAD): The LAD is patent. There are mild irregularities without significant stenoses. The diagonal branches are patent. The LAD wraps around the left ventricular apex.  Left circumflex (LCx): The circumflex is patent. The OM and posterior lateral branches are patent. There is no obstructive  disease present.  Right coronary artery (RCA): Large, dominant vessel. The vessel has diffuse irregularity without significant stenosis. At the junction of the proximal and mid vessel, there is 20-30% stenosis. The PDA and PLA branches are patent without stenosis.  Left ventriculography: Left ventricular systolic function is hyperdynamic, LVEF is estimated at 65-75%, there is no significant mitral regurgitation   Estimated Blood Loss: Minimal  Final Conclusions:   1. Minimal nonobstructive coronary artery disease as detailed above 2. Hyperdynamic LV systolic function 3. Hypertensive heart disease  Recommendations: The patient needs management of his severe hypertension. Will cycle cardiac enzymes. Anticipate hospital discharge next 24-48 hours as long as blood pressure is controlled.  Patient Profile     62 y.o. 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  Assessment & Plan    1. Atypical chest pain: patient was in his usual state of health until 3-4 weeks ago when he began developing dull chest pain upon waking lasting for ~30 minutes, improved with sitting upright/walking. He noticed this continued to happen most mornings, also with worsening of symptoms after eating. He is fairly active, works as a [employer], and denies episodes of crushing chest pain. Denies associated diaphoresis, dizziness, lightheadedness, or vomiting. Does note some nausea first thing in the morning. Code STEMI activated in the ED for anterior STE on EKG, however these are chronic, therefore code was cancelled. Symptoms suspicious for GI origin. Did get some relief with GI cocktail in the ED.  - Troponin negative x3 - EKG with NSR, anterior STE (seen on previous EKGs), and submm elevations in V5-6.  - Echocardiogram pending - consideration for pericarditis  given recent viral illness and improvement of symptoms when sitting upright or walking.   2. HTN: patient ran out of one of his BP medications 1 week ago (didn't know which one). Medications restarted and BP improved this AM - Continue amlodipine and HCTZ  3. Murmur appreciated on exam: No prior echo to review. Patient without syncope/pre-syncope.  - Echocardiogram pending    For questions or updates, please contact [facility] Please consult [email or web address] for contact info under Cardiology/STEMI.      Signed, Beatriz StallionKrista M. Kroeger, PA-C  08/20/2017, 10:39 AM   [phone]  Attending Note:   The patient was seen and examined.  Agree with assessment and plan as noted above.  Changes made to the above note as needed.  Patient seen and independently examined with Judy PimpleKrista Kroeger, PA .   We discussed all aspects  of the encounter. I agree with the assessment and plan as stated above.  1.   Chest pain :   troponins are negative. ECG shows persistent ST elevation at baseline for the past several years .  Will get a coronary CT angio today   2. HTN:   Needs to be compliant with his meds.    I have spent a total of 40 minutes with patient reviewing hospital  notes , telemetry, EKGs, labs and examining patient as well as establishing an assessment and plan that was discussed with the patient. > 50% of time was spent in direct patient care.    Vesta Mixer, Montez Hageman., MD, [facility], [place] 08/20/2017, 11:21 AM [address]  [address] Office [phone] Pager [phone]

## 2017-08-20 NOTE — Progress Notes (Signed)
Pt discharge instructions reviewed with pt. Pt verbalizes understanding. Pt belongings with pt. Pt's daughter is driving him home. Pt discharged via wheelchair.

## 2017-08-20 NOTE — Progress Notes (Signed)
[facility]. A&Ox4. No complaints of pain. VSS. SR/SB on telemetry. BR elevated. Md aware meds given in ED. No skin breakdown noted Moderate fall risk. Patient oriented to room and call system.

## 2017-08-20 NOTE — Progress Notes (Deleted)
[facility] A &Ox4. No complaints of pain. VSS. BR elevated MD aware. RA. No skin breakdown noted. Moderate fall risk. Patient oriented to room and call system.

## 2017-08-20 NOTE — Discharge Instructions (Signed)
If you do not have insurance, can [facility] the [facility] for a follow up appointment.

## 2017-08-20 NOTE — Discharge Summary (Signed)
Physician Discharge Summary  Craig Hart:[id number] DOB: 18-Jul-1955 DOA: 08/19/2017  PCP: Verlon Au, MD  Admit date: 08/19/2017 Discharge date: 08/20/2017   Recommendations for Outpatient Follow-Up:   1. Outpatient GI referral-- started on PPI   Discharge Diagnosis:   Principal Problem:   Chest pain Active Problems:   Hypertensive urgency   Discharge disposition:  Home.   Discharge Condition: Improved.  Diet recommendation: Low sodium, heart healthy.  Wound care: None.   History of Present Illness:   Craig Hart a 62 y.o.malewithhistory of hypertension who ran out of his 1 of her antihypertensives over a week ago and had flulike symptoms 3 weeks ago presents to the ER after being referred from urgent care center for chest pain. Patient states since a flulike symptom patient has been having chest pain burning in sensation which lasts for couple of hours increased on lying down and decreased on walking. Denies any shortness of breath. Denies any nausea vomiting or palpitations of diaphoresis. Patient had gone to urgent care center and over the EKG was concerning for ST elevation MI and patient was referred to the ER.     Hospital Course by Problem:  Chest pain-appreciate cardiology consult -EKG changes similar to prior -CE negative -suspect GI source-- improved with GI coctail and will need GI referral -echo: Systolic function was vigorous. The estimated ejection fraction was in the range of 65% to 70%.  coronary CTA with risk score of 0  Hypertensive urgency -ran out of 1 of his medications last week.  -resume home meds      Medical Consultants:    cards   Discharge Exam:   Vitals:   08/20/17 1245 08/20/17 1711  BP: (!) 153/92 137/87  Pulse: 61   Resp: 18   Temp: 97.8 F (36.6 C)   SpO2: 99%    Vitals:   08/20/17 0547 08/20/17 0802 08/20/17 1245 08/20/17 1711  BP: 125/78 134/82 (!) 153/92 137/87  Pulse: (!) 56  (!) 55 61   Resp: 16 18 18    Temp: 97.8 F (36.6 C) 98 F (36.7 C) 97.8 F (36.6 C)   TempSrc: Oral Oral Oral   SpO2: 100% 100% 99%   Weight: 98.7 kg (217 lb 11.2 oz)     Height:        Gen:  NAD    The results of significant diagnostics from this hospitalization (including imaging, microbiology, ancillary and laboratory) are listed below for reference.     Procedures and Diagnostic Studies:   Ct Coronary Morph W/cta Cor W/score W/ca W/cm &/or Wo/cm  Addendum Date: 08/20/2017   ADDENDUM REPORT: 08/20/2017 17:58 CLINICAL DATA:  62 -year-old male with chest pain and abnormal ECG. EXAM: Cardiac/Coronary  CT TECHNIQUE: The patient was scanned on a [employer]. FINDINGS: A 120 kV prospective scan was triggered in the descending thoracic aorta at 111 HU's. Axial non-contrast 3 mm slices were carried out through the heart. The data set was analyzed on a dedicated work station and scored using the Agatson method. Gantry rotation speed was 250 msecs and collimation was .6 mm. No beta blockade and 0.8 mg of sl NTG was given. The 3D data set was reconstructed in 5% intervals of the 67-82 % of the R-R cycle. Diastolic phases were analyzed on a dedicated work station using MPR, MIP and VRT modes. The patient received 80 cc of contrast. Aorta: Ascending aortic aneurysm with maximal diameter 41 mm. No calcifications. No dissection. Aortic Valve:  Trileaflet.  No calcifications. Coronary Arteries:  Normal coronary origin.  Right dominance. RCA is a large dominant artery that gives rise to PDA and PLVB. There is no plaque. Left main is a large artery that gives rise to LAD and LCX arteries. LM has no plaque. LAD is a large vessel that gives rise to two small diagonal arteries and has no plaque. LCX is a non-dominant artery that gives rise to one large OM1 branch. There is no plaque. Other findings: Normal pulmonary vein drainage into the left atrium. Normal let atrial appendage without a thrombus.  IMPRESSION: 1. Coronary calcium score of 0. This was 0 percentile for age and sex matched control. 2. Normal coronary origin with right dominance. 3. No evidence of CAD. 4. Ascending aortic aneurysm with maximal diameter 41 mm. 5. Mildly dilated pulmonary artery measuring 33 mm suggestive of pulmonary hypertension. 6. Normal pericardial thickness and no pericardial effusion. Electronically Signed   By: Tobias Alexander   On: 08/20/2017 17:58   Result Date: 08/20/2017 EXAM: OVER-READ INTERPRETATION  CT CHEST The following report is an over-read performed by radiologist Dr. Jeronimo Greaves of [facility] on 08/20/2017. This over-read does not include interpretation of cardiac or coronary anatomy or pathology. The CTA interpretation by the cardiologist is attached. COMPARISON:  Chest radiograph 08/19/2017 FINDINGS: Vascular: Tortuous thoracic aorta. Normal aortic caliber, without dissection. No central pulmonary embolism, on this non-dedicated study. Mediastinum/Nodes: No imaged thoracic adenopathy. Lungs/Pleura: No pleural fluid.  Clear imaged lungs. Upper Abdomen: Too small to characterize high liver lesion is most likely a cyst at 8 mm. Normal imaged portions of the spleen, stomach. Musculoskeletal: No acute osseous abnormality. Mild thoracic spondylosis. IMPRESSION: No acute findings in the imaged extracardiac chest. Electronically Signed: By: Jeronimo Greaves M.D. On: 08/20/2017 16:39   Dg Chest Portable 1 View  Result Date: 08/19/2017 CLINICAL DATA:  Patient with chest pain. EXAM: PORTABLE CHEST 1 VIEW COMPARISON:  Chest radiograph 07/30/2015. FINDINGS: Monitoring leads overlie the patient. Stable cardiac and mediastinal contours. No consolidative pulmonary opacities. No pleural effusion or pneumothorax. IMPRESSION: No acute cardiopulmonary process. Electronically Signed   By: Annia Belt M.D.   On: 08/19/2017 18:58     Labs:   Basic Metabolic Panel: Recent Labs  Lab 08/19/17 1841 08/20/17 0911    NA 140 137  K 3.3* 3.6  CL 100* 101  CO2  --  25  GLUCOSE 91 91  BUN 18 15  CREATININE 1.30* 1.21  CALCIUM  --  9.3   GFR Estimated Creatinine Clearance: 77.8 mL/min (by C-G formula based on SCr of 1.21 mg/dL). Liver Function Tests: No results for input(s): AST, ALT, ALKPHOS, BILITOT, PROT, ALBUMIN in the last 168 hours. No results for input(s): LIPASE, AMYLASE in the last 168 hours. No results for input(s): AMMONIA in the last 168 hours. Coagulation profile Recent Labs  Lab 08/19/17 1832  INR 1.03    CBC: Recent Labs  Lab 08/19/17 1832 08/19/17 1841 08/20/17 0911  WBC 8.6  --  7.0  NEUTROABS 4.9  --   --   HGB 15.6 16.0 15.2  HCT 45.3 47.0 45.3  MCV 89.0  --  89.2  PLT 216  --  203   Cardiac Enzymes: Recent Labs  Lab 08/19/17 2208 08/20/17 0246 08/20/17 0911  TROPONINI <0.03 <0.03 <0.03   BNP: Invalid input(s): POCBNP CBG: No results for input(s): GLUCAP in the last 168 hours. D-Dimer No results for input(s): DDIMER in the last 72 hours. Hgb  A1c No results for input(s): HGBA1C in the last 72 hours. Lipid Profile No results for input(s): CHOL, HDL, LDLCALC, TRIG, CHOLHDL, LDLDIRECT in the last 72 hours. Thyroid function studies No results for input(s): TSH, T4TOTAL, T3FREE, THYROIDAB in the last 72 hours.  Invalid input(s): FREET3 Anemia work up No results for input(s): VITAMINB12, FOLATE, FERRITIN, TIBC, IRON, RETICCTPCT in the last 72 hours. Microbiology No results found for this or any previous visit (from the past 240 hour(s)).   Discharge Instructions:   Discharge Instructions    Diet - low sodium heart healthy   Complete by:  As directed    Discharge instructions   Complete by:  As directed    Calcium score on your CT scan was O Follow up with PCP or clinic for referral to GI   Increase activity slowly   Complete by:  As directed      Allergies as of 08/20/2017   No Known Allergies     Medication List    TAKE these medications    amLODipine 10 MG tablet Commonly known as:  NORVASC Take 1 tablet (10 mg total) by mouth every evening.   hydrochlorothiazide 25 MG tablet Commonly known as:  HYDRODIURIL Take 1 tablet (25 mg total) by mouth every evening.   lisinopril 40 MG tablet Commonly known as:  PRINIVIL,ZESTRIL Take 1 tablet (40 mg total) by mouth every evening.   pantoprazole 40 MG tablet Commonly known as:  PROTONIX Take 1 tablet (40 mg total) by mouth 2 (two) times daily before a meal. Start taking on:  08/21/2017      Follow-up Information    Verlon AuBoyd, Tammy Lamonica, MD Follow up in 1 week(s).   Specialty:  Family Medicine Why:  referral to GI Contact information: [address] [place] [address] [phone]        Nahser, Deloris PingPhilip J, MD Follow up.   Specialty:  Cardiology Why:  office will call with date and time of follow up. Contact information: [address] [place] [address] [phone]            Time coordinating discharge: 35 min  Signed:  Joseph ArtJessica U Alyssah Algeo   [facility] 08/20/2017, 6:28 PM

## 2017-08-22 DIAGNOSIS — R079 Chest pain, unspecified: Secondary | ICD-10-CM

## 2018-12-02 ENCOUNTER — Other Ambulatory Visit: Payer: Self-pay

## 2018-12-02 ENCOUNTER — Emergency Department (HOSPITAL_COMMUNITY)
Admission: EM | Admit: 2018-12-02 | Discharge: 2018-12-02 | Disposition: A | Discharge status: Home / Self Care | Payer: BC Managed Care – PPO | Attending: Emergency Medicine | Admitting: Emergency Medicine

## 2018-12-02 ENCOUNTER — Encounter (HOSPITAL_COMMUNITY): Payer: Self-pay | Admitting: Family Medicine

## 2018-12-02 DIAGNOSIS — I10 Essential (primary) hypertension: Secondary | ICD-10-CM | POA: Insufficient documentation

## 2018-12-02 DIAGNOSIS — L739 Follicular disorder, unspecified: Secondary | ICD-10-CM

## 2018-12-02 DIAGNOSIS — Z79899 Other long term (current) drug therapy: Secondary | ICD-10-CM | POA: Diagnosis not present

## 2018-12-02 DIAGNOSIS — R2240 Localized swelling, mass and lump, unspecified lower limb: Secondary | ICD-10-CM | POA: Diagnosis present

## 2018-12-02 DIAGNOSIS — L298 Other pruritus: Secondary | ICD-10-CM | POA: Diagnosis not present

## 2018-12-02 MED ORDER — DOXYCYCLINE HYCLATE 100 MG PO CAPS: 100.0000 mg | ORAL_CAPSULE | Freq: Two times a day (BID) | ORAL | 0 refills | Status: DC

## 2018-12-02 MED ORDER — DOXYCYCLINE HYCLATE 100 MG PO TABS
100.0000 mg | ORAL_TABLET | Freq: Once | ORAL | Status: DC
  Filled 2018-12-02: qty 1

## 2018-12-02 NOTE — Discharge Instructions (Addendum)
Take the medications as prescribed, apply warm compresses to the area, return for fever, worsening symptoms

## 2018-12-02 NOTE — ED Triage Notes (Signed)
Patient is from home and complaining of possible insect bite on his right buttock cheek. Patient states 2 days ago he noticed the bites and reports his friend informs him of drainage from the site. Also, reports his right groin his sore and has a hard knot.

## 2018-12-02 NOTE — ED Notes (Addendum)
When attempting to administer medication, patient states "I don't want that medication because its generic and generic medications do not work for me." Explained to patient that medication is same as brand name and just as effective, but patient still refused. Patient states that he will start his antibiotic tomorrow when he picks up the brand name from the pharmacy. Discharge instructions reviewed. Opportunity for questions allowed. Patient stable, alert, and ambulatory at discharge.

## 2018-12-02 NOTE — ED Provider Notes (Signed)
[facility] DEPT Provider Note   CSN: [id number] Arrival date & time: 12/02/18  1928     History   Chief Complaint Chief Complaint  Patient presents with  . Insect Bite    HPI Craig Hart is a 63 y.o. male.     HPI Pt presents with pain in the right buttock.  Pt noticed an area that is slightly swollen and itchy.  It is tender to the the touch.  He has also noticed some swollen areas in the groin.  No fevers.  No drainage.  No dysuria.   Past Medical History:  Diagnosis Date  . Hypertension   . Hypertensive emergency 09/21/2014    Patient Active Problem List   Diagnosis Date Noted  . Chest pain 08/19/2017  . Hypertensive urgency 08/19/2017  . Chest pain with moderate risk for cardiac etiology   . ST elevation   . Hypokalemia 09/23/2014  . Abnormal EKG 09/23/2014  . Hyperlipidemia 09/23/2014  . Accelerated hypertension 09/21/2014    Past Surgical History:  Procedure Laterality Date  . LEFT HEART CATHETERIZATION WITH CORONARY ANGIOGRAM N/A 09/21/2014   Procedure: LEFT HEART CATHETERIZATION WITH CORONARY ANGIOGRAM;  Surgeon: Sherren Mocha, MD;  Location: [facility] CATH LAB;  Service: Cardiovascular:  minimal LAD disease.  20-30% mid RCA disease with a right dominant system.  EF 65-70%. --False activation of ST elevation MI.  . TONSILLECTOMY          Home Medications    Prior to Admission medications   Medication Sig Start Date End Date Taking? Authorizing Provider  amLODipine (NORVASC) 10 MG tablet Take 1 tablet (10 mg total) by mouth every evening. Patient taking differently: Take 10 mg by mouth daily.  08/20/17  Yes Vann, Jessica U, DO  hydrochlorothiazide (HYDRODIURIL) 25 MG tablet Take 1 tablet (25 mg total) by mouth every evening. Patient taking differently: Take 25 mg by mouth daily.  08/20/17  Yes Geradine Girt, DO  Multiple Vitamins-Minerals (MULTIVITAMIN MEN 50+) TABS Take 1 tablet by mouth daily.   Yes [provider]   doxycycline (VIBRAMYCIN) 100 MG capsule Take 1 capsule (100 mg total) by mouth 2 (two) times daily. 12/02/18   Dorie Rank, MD  lisinopril (PRINIVIL,ZESTRIL) 40 MG tablet Take 1 tablet (40 mg total) by mouth every evening. Patient not taking: Reported on 12/02/2018 08/20/17   Geradine Girt, DO  pantoprazole (PROTONIX) 40 MG tablet Take 1 tablet (40 mg total) by mouth 2 (two) times daily before a meal. Patient not taking: Reported on 12/02/2018 08/21/17   Geradine Girt, DO    Family History Family History  Problem Relation Age of Onset  . Hypertension Mother   . Diabetes Mother   . Hypertension Father     Social History Social History   Tobacco Use  . Smoking status: Never Smoker  . Smokeless tobacco: Never Used  Substance Use Topics  . Alcohol use: No  . Drug use: No     Allergies   Patient has no known allergies.   Review of Systems Review of Systems  All other systems reviewed and are negative.    Physical Exam Updated Vital Signs BP (!) 164/100 (BP Location: Left Arm)   Pulse 60   Temp 98.4 F (36.9 C) (Oral)   Resp 18   SpO2 100%   Physical Exam Vitals signs and nursing note reviewed.  Constitutional:      General: He is not in acute distress.    Appearance:  He is well-developed.  HENT:     Head: Normocephalic and atraumatic.     Right Ear: External ear normal.     Left Ear: External ear normal.  Eyes:     General: No scleral icterus.       Right eye: No discharge.        Left eye: No discharge.     Conjunctiva/sclera: Conjunctivae normal.  Neck:     Musculoskeletal: Neck supple.     Trachea: No tracheal deviation.  Cardiovascular:     Rate and Rhythm: Normal rate and regular rhythm.  Pulmonary:     Effort: Pulmonary effort is normal. No respiratory distress.     Breath sounds: Normal breath sounds. No stridor. No wheezing or rales.  Abdominal:     General: Bowel sounds are normal. There is no distension.     Palpations: Abdomen is soft.      Tenderness: There is no abdominal tenderness. There is no guarding or rebound.  Genitourinary:    Comments: Inguinal lymph nodes noted on right side, small area of induration in the buttock medially, approx 1 cm area. No fluctuance Musculoskeletal:        General: No tenderness.  Skin:    General: Skin is warm and dry.     Findings: No rash.  Neurological:     Mental Status: He is alert.     Cranial Nerves: No cranial nerve deficit (no facial droop, extraocular movements intact, no slurred speech).     Sensory: No sensory deficit.     Motor: No abnormal muscle tone or seizure activity.     Coordination: Coordination normal.      ED Treatments / Results  Labs (all labs ordered are listed, but only abnormal results are displayed) Labs Reviewed - No data to display  EKG    Radiology No results found.  Procedures Procedures (including critical care time)  Medications Ordered in ED Medications  doxycycline (VIBRA-TABS) tablet 100 mg (has no administration in time range)     Initial Impression / Assessment and Plan / ED Course  I have reviewed the triage vital signs and the nursing notes.  Pertinent labs & imaging results that were available during my care of the patient were reviewed by me and considered in my medical decision making (see chart for details).   No abscess noted on brief bedside US.   Consistent with folliculitis.  Lymphadenopathy noted on the same side. Will  dc home on oral abx,.    Final Clinical Impressions(s) / ED Diagnoses   Final diagnoses:  Folliculitis    ED Discharge Orders         Ordered    doxycycline (VIBRAMYCIN) 100 MG capsule  2 times daily     12/02/18 2230           Linwood DibblesKnapp, Ricquel Foulk, MD 12/02/18 2234

## 2019-10-10 ENCOUNTER — Ambulatory Visit (HOSPITAL_COMMUNITY)
Admission: EM | Admit: 2019-10-10 | Discharge: 2019-10-10 | Disposition: A | Discharge status: Home / Self Care | Payer: No Typology Code available for payment source | Attending: Family Medicine | Admitting: Family Medicine

## 2019-10-10 ENCOUNTER — Other Ambulatory Visit: Payer: Self-pay

## 2019-10-10 ENCOUNTER — Encounter (HOSPITAL_COMMUNITY): Payer: Self-pay

## 2019-10-10 DIAGNOSIS — S0990XA Unspecified injury of head, initial encounter: Secondary | ICD-10-CM

## 2019-10-10 DIAGNOSIS — I10 Essential (primary) hypertension: Secondary | ICD-10-CM

## 2019-10-10 DIAGNOSIS — S0991XA Unspecified injury of ear, initial encounter: Secondary | ICD-10-CM

## 2019-10-10 MED ORDER — DICLOFENAC SODIUM 75 MG PO TBEC: 75.0000 mg | DELAYED_RELEASE_TABLET | Freq: Two times a day (BID) | ORAL | 0 refills | Status: DC

## 2019-10-10 NOTE — Discharge Instructions (Addendum)
Follow up with your primary care doctor or here within 48-72 hours. Do your best to ensure adequate rest. Often individuals will develop a headache associated with mild nausea in the days or hours after a head injury. This is called a concussion and may require further follow up.  Please seek prompt medical care if: You have: A very bad (severe) headache that is not helped by medicine. Trouble walking or weakness in your arms and legs. Clear or bloody fluid coming from your nose or ears. Changes in your seeing (vision). Jerky movements that you cannot control (seizure). You throw up (vomit). Your symptoms get worse. You lose balance. Your speech is slurred. You pass out. You are sleepier and have trouble staying awake. The black centers of your eyes (pupils) change in size.  These symptoms may be an emergency. Do not wait to see if the symptoms will go away. Get medical help right away. Call your local emergency services. Do not drive yourself to the hospital.  Your blood pressure was noted to be elevated during your visit today. If you are currently taking medication for high blood pressure, please ensure you are taking this as directed. If you do not have a history of high blood pressure and your blood pressure remains persistently elevated, you may need to begin taking a medication at some point. You may return here within the next few days to recheck if unable to see your primary care provider or if do not have a one.  BP (!) 151/92 (BP Location: Left Arm)   Pulse 73   Temp 98.2 F (36.8 C) (Oral)   Resp 14   SpO2 99%

## 2019-10-10 NOTE — ED Triage Notes (Signed)
Patient reports he was hit across the head today with a handle from a work vehicle.

## 2019-10-13 ENCOUNTER — Ambulatory Visit (INDEPENDENT_AMBULATORY_CARE_PROVIDER_SITE_OTHER): Payer: No Typology Code available for payment source

## 2019-10-13 ENCOUNTER — Ambulatory Visit (HOSPITAL_COMMUNITY): Payer: Self-pay

## 2019-10-13 ENCOUNTER — Encounter (HOSPITAL_COMMUNITY): Payer: Self-pay

## 2019-10-13 ENCOUNTER — Other Ambulatory Visit: Payer: Self-pay

## 2019-10-13 ENCOUNTER — Ambulatory Visit (HOSPITAL_COMMUNITY)
Admission: EM | Admit: 2019-10-13 | Discharge: 2019-10-13 | Disposition: A | Discharge status: Home / Self Care | Payer: No Typology Code available for payment source | Attending: Family Medicine | Admitting: Family Medicine

## 2019-10-13 DIAGNOSIS — M542 Cervicalgia: Secondary | ICD-10-CM

## 2019-10-13 DIAGNOSIS — S0003XA Contusion of scalp, initial encounter: Secondary | ICD-10-CM

## 2019-10-13 DIAGNOSIS — R519 Headache, unspecified: Secondary | ICD-10-CM

## 2019-10-13 DIAGNOSIS — S161XXS Strain of muscle, fascia and tendon at neck level, sequela: Secondary | ICD-10-CM

## 2019-10-13 MED ORDER — ONDANSETRON HCL 8 MG PO TABS: 8.0000 mg | ORAL_TABLET | Freq: Three times a day (TID) | ORAL | 0 refills | Status: DC | PRN

## 2019-10-13 MED ORDER — TIZANIDINE HCL 4 MG PO TABS
4.0000 mg | ORAL_TABLET | Freq: Four times a day (QID) | ORAL | 0 refills | Status: DC | PRN
Start: 2019-10-13 — End: 2019-10-17

## 2019-10-13 NOTE — Discharge Instructions (Signed)
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  This is helpful at bedtime Take Zofran (ondansetron) as needed for nausea Get plenty of rest.  Activity as tolerated, light activity as authorized but he was able to rest as needed Return Friday for follow-up Call sooner for problems

## 2019-10-13 NOTE — ED Triage Notes (Signed)
Patient following up for neck pain. Reports that Saturday and Sunday, he was unable to sleep d/t pain and is feeling nausea.

## 2019-10-13 NOTE — ED Provider Notes (Signed)
[facility]   [id number] 10/10/19 Arrival Time: 1628  ASSESSMENT & PLAN:  1. Left ear injury, initial encounter   2. Minor head injury, initial encounter   3. Elevated blood pressure reading in office with diagnosis of hypertension     No sign of perichondrial hematoma. Discussed post-concussive symptoms and recommended follow up. Normal neurologic exam. No suspicion for ICH, SAH, or skull fracture. No indication for neurodiagnostic imaging at this time.  Begin: Meds ordered this encounter  Medications  . diclofenac (VOLTAREN) 75 MG EC tablet    Sig: Take 1 tablet (75 mg total) by mouth 2 (two) times daily.    Dispense:  14 tablet    Refill:  0     Discharge Instructions     Follow up with your primary care doctor or here within 48-72 hours. Do your best to ensure adequate rest. Often individuals will develop a headache associated with mild nausea in the days or hours after a head injury. This is called a concussion and may require further follow up.  Please seek prompt medical care if:  You have: ? A very bad (severe) headache that is not helped by medicine. ? Trouble walking or weakness in your arms and legs. ? Clear or bloody fluid coming from your nose or ears. ? Changes in your seeing (vision). ? Jerky movements that you cannot control (seizure).  You throw up (vomit).  Your symptoms get worse.  You lose balance.  Your speech is slurred.  You pass out.  You are sleepier and have trouble staying awake.  The black centers of your eyes (pupils) change in size.  These symptoms may be an emergency. Do not wait to see if the symptoms will go away. Get medical help right away. Call your local emergency services. Do not drive yourself to the hospital.  Your blood pressure was noted to be elevated during your visit today. If you are currently taking medication for high blood pressure, please ensure you are taking this as directed. If you do not have a  history of high blood pressure and your blood pressure remains persistently elevated, you may need to begin taking a medication at some point. You may return here within the next few days to recheck if unable to see your primary care provider or if do not have a one.  BP (!) 151/92 (BP Location: Left Arm)   Pulse 73   Temp 98.2 F (36.8 C) (Oral)   Resp 14   SpO2 99%      Reviewed expectations re: course of current medical issues. Questions answered. Outlined signs and symptoms indicating need for more acute intervention. Patient verbalized understanding. After Visit Summary given.  SUBJECTIVE: History from: patient. Patient is able to give a clear and coherent history.   Craig Hart is a 64 y.o. male who presents with complaint of a head injury today. He reports being hit by a work vehicle handle; vs left ear; immediate pain. LOC: no loss of consciousness. Denies retrograde and anterograde amnesia. Ambulatory since injury. No headaches reported. No extremity sensation changes or weakness. No associated n/v. No visual changes. Normal bowel and bladder habits. Ambulatory without difficulty. OTC treatment: has not tried OTCs for relief of pain.  Denies: balance or coordination problems, confusion, difficulty concentrating, dizziness, paresthesias, ringing in ears, sensitivity to light and noise and weakness. History of head injury or concussion: no.  Increased blood pressure noted today. Reports that he is treated for HTN.  He reports taking medications as instructed, no chest pain on exertion, no dyspnea on exertion, no swelling of ankles, no orthostatic dizziness or lightheadedness, no orthopnea or paroxysmal nocturnal dyspnea, no palpitations and no intermittent claudication symptoms.   OBJECTIVE:  Vitals:   10/10/19 1701  BP: (!) 151/92  Pulse: 73  Resp: 14  Temp: 98.2 F (36.8 C)  TempSrc: Oral  SpO2: 99%     GCS: 15 General appearance: alert; no distress HEENT:  normocephalic; atraumatic; conjunctivae normal; oral mucosa normal; no perichondrial hematoma of left ear Neck: supple with FROM; no midline cervical tenderness or deformity; no anterior mass or crepitus; trachea midline Lungs: clear to auscultation bilaterally Heart: regular rate and rhythm Abdomen: soft, non-tender; no bruising Back: no midline tenderness Extremities: moves all extremities normally; no edema; symmetrical with no gross deformities Skin: warm and dry Neurologic: speech is fluent and clear without dysarthria or aphasia; normal gait Psychological: alert and cooperative; normal mood and affect    No Known Allergies    Past Medical History:  Diagnosis Date  . Hypertension   . Hypertensive emergency 09/21/2014   Past Surgical History:  Procedure Laterality Date  . LEFT HEART CATHETERIZATION WITH CORONARY ANGIOGRAM N/A 09/21/2014   Procedure: LEFT HEART CATHETERIZATION WITH CORONARY ANGIOGRAM;  Surgeon: Sherren Mocha, MD;  Location: [facility] CATH LAB;  Service: Cardiovascular:  minimal LAD disease.  20-30% mid RCA disease with a right dominant system.  EF 65-70%. --False activation of ST elevation MI.  . TONSILLECTOMY     Family History  Problem Relation Age of Onset  . Hypertension Mother   . Diabetes Mother   . Hypertension Father        Vanessa Kick, MD 10/13/19 [phone]

## 2019-10-14 NOTE — ED Provider Notes (Signed)
[facility]    CSN: [id number] Arrival date & time: 10/13/19  1543      History   Chief Complaint Chief Complaint  Patient presents with  . f/u neck pain    HPI Craig Hart is a 64 y.o. male.   HPI  Patient is here for follow-up from a Worker's Compensation injury.  He was seen here on 10/10/2019 for this injury.  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  He was reaching overhead for a handle, heavy metal, that broke off and hit him forcibly on the left side of the head just above and across his upper ear.  No loss of consciousness.  He does have headaches.  His ear is sore.  No visual symptoms, no cognitive changes, no  Vomiting although he is experiencing nausea. When the handle hit his head he saw, and states he jerked his head to try to get away from it but got hit anyway.  The biggest problem he is having at this point is that his neck is very painful.  He can hardly move his head.  He could hardly get out of bed.  He has not been able to sleep.  He states the pain is making him feel sick.  He denies any neck problems in the past.  He denies any numbness or weakness into the arms. He is also having some low back pain but not significant. He is taking the diclofenac that was given to him by the doctor on the 23rd.  He feels like this is not helping him. He does not feel like he can return to work at this time. Past Medical History:  Diagnosis Date  . Hypertension   . Hypertensive emergency 09/21/2014    Patient Active Problem List   Diagnosis Date Noted  . Chest pain 08/19/2017  . Hypertensive urgency 08/19/2017  . Chest pain with moderate risk for cardiac etiology   . ST elevation   . Hypokalemia 09/23/2014  . Abnormal EKG 09/23/2014  . Hyperlipidemia 09/23/2014  . Accelerated hypertension 09/21/2014    Past Surgical History:  Procedure Laterality Date  . LEFT HEART CATHETERIZATION WITH CORONARY ANGIOGRAM N/A 09/21/2014   Procedure: LEFT HEART  CATHETERIZATION WITH CORONARY ANGIOGRAM;  Surgeon: Tonny Bollman, MD;  Location: [facility] CATH [facility];  Service: Cardiovascular:  minimal LAD disease.  20-30% mid RCA disease with a right dominant system.  EF 65-70%. --False activation of ST elevation MI.  . TONSILLECTOMY         Home Medications    Prior to Admission medications   Medication Sig Start Date End Date Taking? Authorizing Provider  amLODipine (NORVASC) 10 MG tablet Take 1 tablet (10 mg total) by mouth every evening. Patient taking differently: Take 10 mg by mouth daily.  08/20/17   Joseph Art, DO  diclofenac (VOLTAREN) 75 MG EC tablet Take 1 tablet (75 mg total) by mouth 2 (two) times daily. 10/10/19   Mardella Layman, MD  doxycycline (VIBRAMYCIN) 100 MG capsule Take 1 capsule (100 mg total) by mouth 2 (two) times daily. 12/02/18   Linwood Dibbles, MD  hydrochlorothiazide (HYDRODIURIL) 25 MG tablet Take 1 tablet (25 mg total) by mouth every evening. Patient taking differently: Take 25 mg by mouth daily.  08/20/17   Joseph Art, DO  lisinopril (PRINIVIL,ZESTRIL) 40 MG tablet Take 1 tablet (40 mg total) by mouth every evening. Patient not taking: Reported on 12/02/2018 08/20/17   Joseph Art, DO  Multiple Vitamins-Minerals (  MULTIVITAMIN MEN 50+) TABS Take 1 tablet by mouth daily.    [provider]  ondansetron (ZOFRAN) 8 MG tablet Take 1 tablet (8 mg total) by mouth every 8 (eight) hours as needed for nausea or vomiting. 10/13/19   Eustace Moore, MD  pantoprazole (PROTONIX) 40 MG tablet Take 1 tablet (40 mg total) by mouth 2 (two) times daily before a meal. Patient not taking: Reported on 12/02/2018 08/21/17   Joseph Art, DO  tiZANidine (ZANAFLEX) 4 MG tablet Take 1-2 tablets (4-8 mg total) by mouth every 6 (six) hours as needed for muscle spasms. 10/13/19   Eustace Moore, MD    Family History Family History  Problem Relation Age of Onset  . Hypertension Mother   . Diabetes Mother   . Hypertension Father      Social History Social History   Tobacco Use  . Smoking status: Never Smoker  . Smokeless tobacco: Never Used  Substance Use Topics  . Alcohol use: No  . Drug use: No     Allergies   Patient has no known allergies.   Review of Systems Review of Systems  Musculoskeletal: Positive for neck pain and neck stiffness.  Neurological: Positive for headaches.    Physical Exam Triage Vital Signs ED Triage Vitals  Enc Vitals Group     BP 10/13/19 1614 (!) 148/90     Pulse Rate 10/13/19 1614 74     Resp 10/13/19 1614 16     Temp 10/13/19 1614 98.4 F (36.9 C)     Temp Source 10/13/19 1614 Oral     SpO2 10/13/19 1614 95 %     Weight --      Height --      Head Circumference --      Peak Flow --      Pain Score 10/13/19 1613 6     Pain Loc --      Pain Edu? --      Excl. in GC? --    No data found.  Updated Vital Signs BP (!) 148/90 (BP Location: Right Arm)   Pulse 74   Temp 98.4 F (36.9 C) (Oral)   Resp 16   SpO2 95%      Physical Exam Constitutional:      General: He is not in acute distress.    Appearance: He is well-developed and normal weight.     Comments: Muscular in appearance.  In pain, stiff guarded movements  HENT:     Head: Normocephalic and atraumatic.     Right Ear: Tympanic membrane, ear canal and external ear normal.     Left Ear: Tympanic membrane, ear canal and external ear normal.     Ears:     Comments: Pinna L is swollen and tender, no open skin    Mouth/Throat:     Comments: Mask is in place Eyes:     Conjunctiva/sclera: Conjunctivae normal.     Pupils: Pupils are equal, round, and reactive to light.  Cardiovascular:     Rate and Rhythm: Normal rate.  Pulmonary:     Effort: Pulmonary effort is normal. No respiratory distress.  Musculoskeletal:        General: No deformity or signs of injury. Normal range of motion.     Cervical back: Tenderness present. Pain with movement and muscular tenderness present.  Skin:    General: Skin is  warm and dry.  Neurological:     Mental Status: He is alert.  Sensory: No sensory deficit.     Motor: No weakness.     Deep Tendon Reflexes: Reflexes normal.  Psychiatric:        Mood and Affect: Mood normal.        Behavior: Behavior normal.      UC Treatments / Results  Labs (all labs ordered are listed, but only abnormal results are displayed) Labs Reviewed - No data to display  EKG   Radiology DG Cervical Spine Complete  Result Date: 10/13/2019 CLINICAL DATA:  Acute neck pain after injury 3 days ago. EXAM: CERVICAL SPINE - COMPLETE 4+ VIEW COMPARISON:  None. FINDINGS: Minimal retrolisthesis of C6-7 is noted secondary to moderate degenerative disc disease at this level. Mild bilateral neural foraminal stenosis is noted at this level secondary to uncovertebral spurring. No definite fracture is noted. Degenerative changes are seen involving the right-sided posterior facet joints. No prevertebral soft tissue swelling is noted. Mild degenerative disc disease is noted at C4-5 and C7-T1. IMPRESSION: Multilevel degenerative disc disease. Mild bilateral neural foraminal stenosis is noted at C6-7 secondary to uncovertebral spurring. No acute abnormality seen in the cervical spine. Electronically Signed   By: Marijo Conception M.D.   On: 10/13/2019 17:07    Procedures Procedures (including critical care time)  Medications Ordered in UC Medications - No data to display  Initial Impression / Assessment and Plan / UC Course  I have reviewed the triage vital signs and the nursing notes.  Pertinent labs & imaging results that were available during my care of the patient were reviewed by me and considered in my medical decision making (see chart for details).    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  This is helpful at bedtime Take Zofran (ondansetron) as needed for nausea Get plenty of rest.  Activity as tolerated, light activity as authorized but he was able to rest as needed Return Friday for follow-up Call sooner for problems   ED Prescriptions    Medication Sig Dispense Auth. Provider   ondansetron (ZOFRAN) 8 MG tablet Take 1 tablet (8 mg total) by mouth every 8 (eight) hours as needed for nausea or vomiting. 20 tablet Raylene Everts, MD   tiZANidine (ZANAFLEX) 4 MG tablet Take 1-2 tablets (4-8 mg total) by mouth every 6 (six) hours as needed for muscle spasms. 21 tablet Raylene Everts, MD     PDMP not reviewed this encounter.   Raylene Everts, MD 10/14/19 [phone]

## 2019-10-17 ENCOUNTER — Encounter (HOSPITAL_COMMUNITY): Payer: Self-pay

## 2019-10-17 ENCOUNTER — Other Ambulatory Visit: Payer: Self-pay

## 2019-10-17 ENCOUNTER — Ambulatory Visit (HOSPITAL_COMMUNITY)
Admission: EM | Admit: 2019-10-17 | Discharge: 2019-10-17 | Disposition: A | Discharge status: Home / Self Care | Payer: BC Managed Care – PPO | Attending: Family Medicine | Admitting: Family Medicine

## 2019-10-17 DIAGNOSIS — S161XXS Strain of muscle, fascia and tendon at neck level, sequela: Secondary | ICD-10-CM

## 2019-10-17 DIAGNOSIS — G44309 Post-traumatic headache, unspecified, not intractable: Secondary | ICD-10-CM

## 2019-10-17 DIAGNOSIS — R110 Nausea: Secondary | ICD-10-CM

## 2019-10-17 MED ORDER — TRAMADOL-ACETAMINOPHEN 37.5-325 MG PO TABS
1.0000 | ORAL_TABLET | Freq: Four times a day (QID) | ORAL | 0 refills | Status: DC | PRN
Start: 2019-10-17 — End: 2020-03-22

## 2019-10-17 MED ORDER — METHOCARBAMOL 500 MG PO TABS: 500.0000 mg | ORAL_TABLET | Freq: Three times a day (TID) | ORAL | 0 refills | Status: DC

## 2019-10-17 NOTE — Discharge Instructions (Signed)
Continue diclofenac 1 pill twice a day.  This is an anti-inflammatory medicine Please take the Zofran (ondansetron) for nausea.  Take 1 every morning when you get up so that you are able to eat Take Ultracet as needed for pain.  Take 2 at bedtime to help sleep.  Take with food Stop the tizanidine.  This is not helping as a muscle relaxer.  Take Robaxin in its place as needed for muscle spasms Continue ice / heat Continue massage See occupational medicine on Monday at 9 AM

## 2019-10-17 NOTE — ED Triage Notes (Signed)
Pt presents to UC for follow up worker's comp. Pt states he do not have any improve with the neck pain, headaches and nausea since the last visit.

## 2019-10-17 NOTE — ED Provider Notes (Signed)
[facility]    CSN: [id number] Arrival date & time: 10/17/19  1884      History   Chief Complaint Chief Complaint  Patient presents with  . Follow-up    workers comp    HPI Craig Hart is a 64 y.o. male.   HPI   Patient is here for [employer]. follow-up He was seen for his initial injury on 10/10/2019.  Follow-up on 10/13/2019.  I saw him on the second visit.  He states he is no better than he was when I saw him.  He still has neck pain.  He still has headaches.  He still has nausea.  He is reluctant to take medications.  He is taking the diclofenac.  He states when he takes tizanidine it wakes him up and is unable to sleep.  He states he has not gotten enough sleep since the injury.  He has daily headaches.  He has daily nausea.  They are not  At the same time, they both come in waves.  No visual symptoms.  No problems with cognition.  No problems with speech.  No trouble with strength or balance.  He is here with his wife. He did not have a headache condition prior to his injury.  He infrequently had headaches.  He has never been diagnosed with migraines. 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  Past Medical History:  Diagnosis Date  . Hypertension   . Hypertensive emergency 09/21/2014    Patient Active Problem List   Diagnosis Date Noted  . Chest pain 08/19/2017  . Hypertensive urgency 08/19/2017  . Chest pain with moderate risk for cardiac etiology   . ST elevation   . Hypokalemia 09/23/2014  . Abnormal EKG 09/23/2014  . Hyperlipidemia 09/23/2014  . Accelerated hypertension 09/21/2014    Past Surgical History:  Procedure Laterality Date  . LEFT HEART CATHETERIZATION WITH CORONARY ANGIOGRAM N/A 09/21/2014   Procedure: LEFT HEART CATHETERIZATION WITH CORONARY ANGIOGRAM;  Surgeon: Tonny Bollman, MD;  Location: [facility] CATH LAB;  Service: Cardiovascular:  minimal LAD  disease.  20-30% mid RCA disease with a right dominant system.  EF 65-70%. --False activation of ST elevation MI.  . TONSILLECTOMY         Home Medications    Prior to Admission medications   Medication Sig Start Date End Date Taking? Authorizing Provider  amLODipine (NORVASC) 10 MG tablet Take 1 tablet (10 mg total) by mouth every evening. Patient taking differently: Take 10 mg by mouth daily.  08/20/17   Joseph Art, DO  diclofenac (VOLTAREN) 75 MG EC tablet Take 1 tablet (75 mg total) by mouth 2 (two) times daily. 10/10/19   Mardella Layman, MD  hydrochlorothiazide (HYDRODIURIL) 25 MG tablet Take 1 tablet (25 mg total) by mouth every evening. Patient taking differently: Take 25 mg by mouth daily.  08/20/17   Joseph Art, DO  methocarbamol (ROBAXIN) 500 MG tablet Take 1 tablet (500 mg total) by mouth 3 (three) times daily. 10/17/19   Eustace Moore, MD  ondansetron (ZOFRAN) 8 MG tablet Take 1 tablet (8 mg total) by mouth every 8 (eight) hours as needed for nausea or vomiting. 10/13/19   Eustace Moore, MD  traMADol-acetaminophen (ULTRACET) 37.5-325 MG tablet Take 1-2 tablets by mouth every 6 (six) hours as needed. 10/17/19   Eustace Moore, MD  lisinopril (PRINIVIL,ZESTRIL) 40 MG tablet Take  1 tablet (40 mg total) by mouth every evening. Patient not taking: Reported on 12/02/2018 08/20/17 10/17/19  Joseph Art, DO  pantoprazole (PROTONIX) 40 MG tablet Take 1 tablet (40 mg total) by mouth 2 (two) times daily before a meal. Patient not taking: Reported on 12/02/2018 08/21/17 10/17/19  Joseph Art, DO    Family History Family History  Problem Relation Age of Onset  . Hypertension Mother   . Diabetes Mother   . Hypertension Father     Social History Social History   Tobacco Use  . Smoking status: Never Smoker  . Smokeless tobacco: Never Used  Substance Use Topics  . Alcohol use: No  . Drug use: No     Allergies   Patient has no known allergies.   Review of  Systems Review of Systems  Eyes: Negative for visual disturbance.  Gastrointestinal: Positive for nausea. Negative for vomiting.  Musculoskeletal: Positive for neck pain and neck stiffness.  Neurological: Positive for headaches. Negative for weakness and numbness.  Psychiatric/Behavioral: Positive for self-injury. Negative for confusion and decreased concentration.     Physical Exam Triage Vital Signs ED Triage Vitals  Enc Vitals Group     BP 10/17/19 0832 (!) 156/92     Pulse Rate 10/17/19 0832 67     Resp 10/17/19 0832 18     Temp 10/17/19 0832 97.9 F (36.6 C)     Temp Source 10/17/19 0832 Oral     SpO2 10/17/19 0832 97 %     Weight --      Height --      Head Circumference --      Peak Flow --      Pain Score 10/17/19 0830 5     Pain Loc --      Pain Edu? --      Excl. in GC? --    No data found.  Updated Vital Signs BP (!) 156/92 (BP Location: Left Arm)   Pulse 67   Temp 97.9 F (36.6 C) (Oral)   Resp 18   SpO2 97%     Physical Exam Constitutional:      General: He is not in acute distress.    Appearance: Normal appearance. He is well-developed.     Comments: No acute distress  HENT:     Head: Normocephalic and atraumatic.     Ears:     Comments: Left ear no longer swollen or tender    Mouth/Throat:     Comments: Mask is in place Eyes:     Conjunctiva/sclera: Conjunctivae normal.     Pupils: Pupils are equal, round, and reactive to light.  Neck:     Comments: Neck muscles remain tender to palpation.  Slow but full range of motion Cardiovascular:     Rate and Rhythm: Normal rate.  Pulmonary:     Effort: Pulmonary effort is normal. No respiratory distress.  Musculoskeletal:        General: Normal range of motion.     Cervical back: Normal range of motion.  Skin:    General: Skin is warm and dry.  Neurological:     Mental Status: He is alert.  Psychiatric:        Mood and Affect: Mood normal.        Behavior: Behavior normal.      UC  Treatments / Results  Labs (all labs ordered are listed, but only abnormal results are displayed) Labs Reviewed - No data to display  EKG  Radiology No results found.  Procedures Procedures (including critical care time)  Medications Ordered in UC Medications - No data to display  Initial Impression / Assessment and Plan / UC Course  I have reviewed the triage vital signs and the nursing notes.  Pertinent labs & imaging results that were available during my care of the patient were reviewed by me and considered in my medical decision making (see chart for details).    I discussed with the patient that he may need referral.  If he is having continued headaches that may be a postconcussion headache.  These are generally self-limited and not harmful, although it is worrisome to him.  He still has neck pain that is all improving.  I think he would benefit from physical therapy.  We are unable to do referrals through the urgent care center.  I am referring him to the occupational medicine clinic for follow-up and additional care Final Clinical Impressions(s) / UC Diagnoses   Final diagnoses:  Cervical strain, acute, sequela  Post-concussion headache  Nausea without vomiting     Discharge Instructions     Continue diclofenac 1 pill twice a day.  This is an anti-inflammatory medicine Please take the Zofran (ondansetron) for nausea.  Take 1 every morning when you get up so that you are able to eat Take Ultracet as needed for pain.  Take 2 at bedtime to help sleep.  Take with food Stop the tizanidine.  This is not helping as a muscle relaxer.  Take Robaxin in its place as needed for muscle spasms Continue ice / heat Continue massage See occupational medicine on Monday at 9 AM   ED Prescriptions    Medication Sig Dispense Auth. Provider   traMADol-acetaminophen (ULTRACET) 37.5-325 MG tablet Take 1-2 tablets by mouth every 6 (six) hours as needed. 20 tablet Raylene Everts, MD     methocarbamol (ROBAXIN) 500 MG tablet Take 1 tablet (500 mg total) by mouth 3 (three) times daily. 30 tablet Raylene Everts, MD     I have reviewed the PDMP during this encounter.   Raylene Everts, MD 10/17/19 1620

## 2020-02-03 ENCOUNTER — Encounter: Payer: Self-pay | Admitting: Physical Medicine & Rehabilitation

## 2020-03-22 ENCOUNTER — Encounter
Payer: No Typology Code available for payment source | Attending: Physical Medicine & Rehabilitation | Admitting: Physical Medicine & Rehabilitation

## 2020-03-22 ENCOUNTER — Other Ambulatory Visit: Payer: Self-pay

## 2020-03-22 ENCOUNTER — Encounter: Payer: Self-pay | Admitting: Physical Medicine & Rehabilitation

## 2020-03-22 VITALS — BP 156/91 | HR 76 | Temp 98.7°F | Ht 71.0 in | Wt 228.0 lb

## 2020-03-22 DIAGNOSIS — M791 Myalgia, unspecified site: Secondary | ICD-10-CM

## 2020-03-22 DIAGNOSIS — F0781 Postconcussional syndrome: Secondary | ICD-10-CM

## 2020-03-22 DIAGNOSIS — R110 Nausea: Secondary | ICD-10-CM

## 2020-03-22 DIAGNOSIS — G479 Sleep disorder, unspecified: Secondary | ICD-10-CM

## 2020-03-22 DIAGNOSIS — M542 Cervicalgia: Secondary | ICD-10-CM | POA: Diagnosis present

## 2020-03-22 MED ORDER — AMITRIPTYLINE HCL 10 MG PO TABS: 20.0000 mg | ORAL_TABLET | Freq: Every day | ORAL | 1 refills | Status: DC
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
## 2020-04-05 ENCOUNTER — Encounter: Payer: Self-pay | Admitting: Physical Medicine & Rehabilitation

## 2020-04-05 ENCOUNTER — Other Ambulatory Visit: Payer: Self-pay

## 2020-04-05 ENCOUNTER — Encounter
Payer: No Typology Code available for payment source | Attending: Physical Medicine & Rehabilitation | Admitting: Physical Medicine & Rehabilitation

## 2020-04-05 VITALS — BP 167/103 | HR 73 | Temp 98.4°F | Ht 71.0 in | Wt 228.4 lb

## 2020-04-05 DIAGNOSIS — M542 Cervicalgia: Secondary | ICD-10-CM | POA: Diagnosis not present

## 2020-04-05 DIAGNOSIS — F0781 Postconcussional syndrome: Secondary | ICD-10-CM | POA: Insufficient documentation

## 2020-04-05 DIAGNOSIS — M791 Myalgia, unspecified site: Secondary | ICD-10-CM

## 2020-04-05 DIAGNOSIS — G479 Sleep disorder, unspecified: Secondary | ICD-10-CM | POA: Insufficient documentation

## 2020-04-05 MED ORDER — GABAPENTIN 300 MG PO CAPS: 300.0000 mg | ORAL_CAPSULE | Freq: Three times a day (TID) | ORAL | 1 refills | Status: DC

## 2020-04-05 MED ORDER — AMITRIPTYLINE HCL 25 MG PO TABS: 25.0000 mg | ORAL_TABLET | Freq: Every day | ORAL | 1 refills | Status: DC
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
## 2020-04-22 ENCOUNTER — Encounter: Payer: Self-pay | Admitting: Physical Medicine & Rehabilitation

## 2020-04-22 ENCOUNTER — Other Ambulatory Visit: Payer: Self-pay

## 2020-04-22 ENCOUNTER — Encounter
Payer: No Typology Code available for payment source | Attending: Physical Medicine & Rehabilitation | Admitting: Physical Medicine & Rehabilitation

## 2020-04-22 VITALS — BP 152/96 | HR 88 | Temp 98.4°F | Ht 71.0 in | Wt 231.4 lb

## 2020-04-22 DIAGNOSIS — G479 Sleep disorder, unspecified: Secondary | ICD-10-CM

## 2020-04-22 DIAGNOSIS — M542 Cervicalgia: Secondary | ICD-10-CM | POA: Diagnosis present

## 2020-04-22 DIAGNOSIS — F0781 Postconcussional syndrome: Secondary | ICD-10-CM | POA: Diagnosis not present

## 2020-04-22 DIAGNOSIS — R110 Nausea: Secondary | ICD-10-CM | POA: Diagnosis present

## 2020-04-22 DIAGNOSIS — M791 Myalgia, unspecified site: Secondary | ICD-10-CM | POA: Diagnosis present

## 2020-04-22 MED ORDER — AMITRIPTYLINE HCL 25 MG PO TABS
25.0000 mg | ORAL_TABLET | Freq: Every day | ORAL | 1 refills | Status: DC
Start: 2020-04-22 — End: 2020-08-05

## 2020-04-22 MED ORDER — SCOPOLAMINE 1 MG/3DAYS TD PT72: 1.0000 | MEDICATED_PATCH | TRANSDERMAL | 12 refills | Status: DC

## 2020-04-22 MED ORDER — SCOPOLAMINE 1 MG/3DAYS TD PT72: 1.0000 | MEDICATED_PATCH | TRANSDERMAL | 1 refills | Status: DC
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
## 2020-05-06 ENCOUNTER — Encounter: Payer: Self-pay | Admitting: Physical Medicine & Rehabilitation

## 2020-05-06 ENCOUNTER — Other Ambulatory Visit: Payer: Self-pay

## 2020-05-06 ENCOUNTER — Encounter
Payer: No Typology Code available for payment source | Attending: Physical Medicine & Rehabilitation | Admitting: Physical Medicine & Rehabilitation

## 2020-05-06 VITALS — BP 157/96 | HR 83 | Temp 98.4°F | Ht 71.0 in | Wt 236.0 lb

## 2020-05-06 DIAGNOSIS — M47812 Spondylosis without myelopathy or radiculopathy, cervical region: Secondary | ICD-10-CM

## 2020-05-06 DIAGNOSIS — F0781 Postconcussional syndrome: Secondary | ICD-10-CM | POA: Diagnosis not present

## 2020-05-06 DIAGNOSIS — G479 Sleep disorder, unspecified: Secondary | ICD-10-CM | POA: Diagnosis not present
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
## 2020-05-21 ENCOUNTER — Other Ambulatory Visit: Payer: Self-pay | Admitting: Physical Medicine & Rehabilitation

## 2020-05-31 ENCOUNTER — Encounter: Payer: Self-pay | Admitting: Physical Medicine & Rehabilitation

## 2020-05-31 ENCOUNTER — Encounter
Payer: No Typology Code available for payment source | Attending: Physical Medicine & Rehabilitation | Admitting: Physical Medicine & Rehabilitation

## 2020-05-31 ENCOUNTER — Other Ambulatory Visit: Payer: Self-pay

## 2020-05-31 VITALS — BP 147/80 | HR 91 | Temp 98.6°F | Ht 71.0 in | Wt 236.0 lb

## 2020-05-31 DIAGNOSIS — R110 Nausea: Secondary | ICD-10-CM

## 2020-05-31 DIAGNOSIS — M47812 Spondylosis without myelopathy or radiculopathy, cervical region: Secondary | ICD-10-CM | POA: Diagnosis present

## 2020-05-31 DIAGNOSIS — G479 Sleep disorder, unspecified: Secondary | ICD-10-CM | POA: Diagnosis not present

## 2020-05-31 DIAGNOSIS — F0781 Postconcussional syndrome: Secondary | ICD-10-CM | POA: Diagnosis not present

## 2020-05-31 DIAGNOSIS — M791 Myalgia, unspecified site: Secondary | ICD-10-CM

## 2020-05-31 DIAGNOSIS — R42 Dizziness and giddiness: Secondary | ICD-10-CM | POA: Diagnosis present

## 2020-05-31 MED ORDER — ONDANSETRON HCL 4 MG PO TABS: 4.0000 mg | ORAL_TABLET | Freq: Two times a day (BID) | ORAL | 0 refills | Status: DC
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
## 2020-06-28 ENCOUNTER — Other Ambulatory Visit: Payer: Self-pay

## 2020-06-28 ENCOUNTER — Encounter: Payer: Self-pay | Admitting: Physical Medicine & Rehabilitation

## 2020-06-28 ENCOUNTER — Encounter
Payer: No Typology Code available for payment source | Attending: Physical Medicine & Rehabilitation | Admitting: Physical Medicine & Rehabilitation

## 2020-06-28 VITALS — BP 194/94 | HR 71 | Temp 97.9°F | Ht 71.0 in | Wt 232.8 lb

## 2020-06-28 DIAGNOSIS — R110 Nausea: Secondary | ICD-10-CM | POA: Insufficient documentation

## 2020-06-28 DIAGNOSIS — M47812 Spondylosis without myelopathy or radiculopathy, cervical region: Secondary | ICD-10-CM | POA: Insufficient documentation

## 2020-06-28 DIAGNOSIS — G479 Sleep disorder, unspecified: Secondary | ICD-10-CM | POA: Diagnosis present

## 2020-06-28 DIAGNOSIS — F0781 Postconcussional syndrome: Secondary | ICD-10-CM | POA: Diagnosis not present

## 2020-06-28 DIAGNOSIS — M791 Myalgia, unspecified site: Secondary | ICD-10-CM | POA: Insufficient documentation

## 2020-06-28 MED ORDER — ONDANSETRON HCL 4 MG PO TABS: 4.0000 mg | ORAL_TABLET | Freq: Three times a day (TID) | ORAL | 1 refills | Status: DC | PRN
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
## 2020-07-15 ENCOUNTER — Other Ambulatory Visit: Payer: Self-pay

## 2020-07-15 ENCOUNTER — Ambulatory Visit: Payer: No Typology Code available for payment source | Attending: Physical Medicine & Rehabilitation

## 2020-07-15 DIAGNOSIS — R41841 Cognitive communication deficit: Secondary | ICD-10-CM | POA: Insufficient documentation

## 2020-07-15 NOTE — Patient Instructions (Signed)
   Put alarms in your phone for when you take your medicines. When the alarm goes off, take the medicine.  Put all your appointments on your calendar; put the calendar in a central location.  When you have the distraction caused by the pain (you reported this as a 8-9/10 today) eliminated, come back and let's work on your thinking! If you choose to have the surgery, make an appointment to come back after the recovery time of the surgery.

## 2020-07-16 NOTE — Therapy (Signed)
[facility] [address] [place], [place], [address] Phone: [phone]   Fax:  [phone]  Patient Details  Name: Craig Hart MRN: [id number] Date of Birth: 02/12/1956 Referring Provider:  Marcello Fennel, MD  Encounter Date: 07/15/2020   ST Arrive-Cancel After long discussion about pain in pt's cervical neck (pt rated 9/10 today), SLP sharing about/educating pt and significant other (SO) of role of pain in decr'd attention, MD recommendations for cervical surgery and pt thoughts/concerns about this surgery, and hearing anecdotal data from pt/SO that pt's attention skills have decr'd as pain in cervical neck has increased, pt, SLP, and SO decided it would be in pt's best interest to postpone this evaluation for cognition-communication after pt decides whether or not he is going to have sx for his cervical neck.  SLP believes ST evaluation is a necessary part of pt's rehab course however pt's pain is playing a significant role in decreasing his attention skills, per anecdotal report from both pt and SO. An ST evaluation should be completed following pt's decision about cervical neck sx and should he undergo sx, after the appropriate recovery time. Pt voiced he is in complete agreement with this plan.   [facility] ,MS, CCC-SLP  07/16/2020, 9:22 AM  [facility] [address] [place], [place], [address] Phone: [phone]   Fax:  [phone]

## 2020-08-05 ENCOUNTER — Other Ambulatory Visit: Payer: Self-pay

## 2020-08-05 ENCOUNTER — Encounter: Payer: Self-pay | Admitting: Physical Medicine & Rehabilitation

## 2020-08-05 ENCOUNTER — Encounter
Payer: No Typology Code available for payment source | Attending: Physical Medicine & Rehabilitation | Admitting: Physical Medicine & Rehabilitation

## 2020-08-05 VITALS — BP 154/91 | HR 74 | Temp 98.0°F | Ht 71.0 in | Wt 230.8 lb

## 2020-08-05 DIAGNOSIS — G479 Sleep disorder, unspecified: Secondary | ICD-10-CM | POA: Diagnosis not present

## 2020-08-05 DIAGNOSIS — F0781 Postconcussional syndrome: Secondary | ICD-10-CM | POA: Diagnosis not present

## 2020-08-05 DIAGNOSIS — R110 Nausea: Secondary | ICD-10-CM | POA: Insufficient documentation

## 2020-08-05 DIAGNOSIS — M47812 Spondylosis without myelopathy or radiculopathy, cervical region: Secondary | ICD-10-CM | POA: Insufficient documentation

## 2020-08-05 MED ORDER — ONDANSETRON HCL 4 MG PO TABS: 4.0000 mg | ORAL_TABLET | Freq: Three times a day (TID) | ORAL | 1 refills | Status: DC | PRN

## 2020-08-05 MED ORDER — AMITRIPTYLINE HCL 25 MG PO TABS
25.0000 mg | ORAL_TABLET | Freq: Every day | ORAL | 1 refills | Status: DC
Start: 2020-08-05 — End: 2020-09-01
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
## 2020-09-01 ENCOUNTER — Other Ambulatory Visit: Payer: Self-pay | Admitting: Physical Medicine & Rehabilitation

## 2020-09-14 ENCOUNTER — Encounter: Payer: No Typology Code available for payment source | Admitting: Physical Medicine & Rehabilitation

## 2020-09-20 ENCOUNTER — Telehealth: Payer: Self-pay | Admitting: Physical Medicine & Rehabilitation

## 2020-09-20 NOTE — Telephone Encounter (Signed)
Patient having surgery tomorrow, and he is not sure if he is going to follow up with our office after, they will keep [place] posted.

## 2020-09-20 NOTE — Telephone Encounter (Signed)
Thanks

## 2020-10-19 ENCOUNTER — Ambulatory Visit: Payer: Self-pay | Admitting: Physical Medicine & Rehabilitation

## 2021-02-03 ENCOUNTER — Encounter: Payer: Self-pay | Admitting: Physical Medicine & Rehabilitation

## 2021-02-03 ENCOUNTER — Encounter
Payer: No Typology Code available for payment source | Attending: Physical Medicine & Rehabilitation | Admitting: Physical Medicine & Rehabilitation

## 2021-02-03 ENCOUNTER — Other Ambulatory Visit: Payer: Self-pay

## 2021-02-03 VITALS — BP 166/88 | HR 70 | Temp 98.0°F | Ht 71.0 in | Wt 223.6 lb

## 2021-02-03 DIAGNOSIS — R42 Dizziness and giddiness: Secondary | ICD-10-CM | POA: Diagnosis present

## 2021-02-03 DIAGNOSIS — M791 Myalgia, unspecified site: Secondary | ICD-10-CM | POA: Diagnosis present

## 2021-02-03 DIAGNOSIS — R110 Nausea: Secondary | ICD-10-CM

## 2021-02-03 DIAGNOSIS — M47812 Spondylosis without myelopathy or radiculopathy, cervical region: Secondary | ICD-10-CM | POA: Diagnosis not present

## 2021-02-03 DIAGNOSIS — F0781 Postconcussional syndrome: Secondary | ICD-10-CM

## 2021-02-03 DIAGNOSIS — G479 Sleep disorder, unspecified: Secondary | ICD-10-CM

## 2021-02-03 MED ORDER — ONDANSETRON HCL 4 MG PO TABS: 4.0000 mg | ORAL_TABLET | Freq: Three times a day (TID) | ORAL | 1 refills | Status: DC | PRN
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
## 2021-02-03 NOTE — Addendum Note (Signed)
Addended by: Maryla Morrow A on: 02/03/2021 10:25 AM   Modules accepted: Orders

## 2021-02-10 ENCOUNTER — Other Ambulatory Visit: Payer: Self-pay | Admitting: *Deleted

## 2021-02-10 ENCOUNTER — Telehealth: Payer: Self-pay | Admitting: *Deleted

## 2021-02-10 MED ORDER — AMITRIPTYLINE HCL 25 MG PO TABS: ORAL_TABLET | ORAL | 1 refills | Status: DC

## 2021-02-10 NOTE — Telephone Encounter (Signed)
Amy from [facility] called to request the referral be faxed to her for the vestibular therapy. I have faxed it to [phone].
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
## 2021-03-07 ENCOUNTER — Ambulatory Visit: Payer: Self-pay | Admitting: Speech Pathology

## 2021-03-10 ENCOUNTER — Other Ambulatory Visit: Payer: Self-pay

## 2021-03-10 ENCOUNTER — Encounter
Payer: No Typology Code available for payment source | Attending: Physical Medicine & Rehabilitation | Admitting: Physical Medicine & Rehabilitation

## 2021-03-10 ENCOUNTER — Encounter: Payer: Self-pay | Admitting: Physical Medicine & Rehabilitation

## 2021-03-10 VITALS — BP 176/97 | HR 71 | Temp 98.4°F | Ht 71.0 in | Wt 221.0 lb

## 2021-03-10 DIAGNOSIS — F0781 Postconcussional syndrome: Secondary | ICD-10-CM | POA: Diagnosis present

## 2021-03-10 DIAGNOSIS — G479 Sleep disorder, unspecified: Secondary | ICD-10-CM | POA: Insufficient documentation

## 2021-03-10 DIAGNOSIS — R110 Nausea: Secondary | ICD-10-CM | POA: Diagnosis not present

## 2021-03-10 DIAGNOSIS — M47812 Spondylosis without myelopathy or radiculopathy, cervical region: Secondary | ICD-10-CM | POA: Insufficient documentation

## 2021-03-10 DIAGNOSIS — M791 Myalgia, unspecified site: Secondary | ICD-10-CM | POA: Insufficient documentation

## 2021-03-10 MED ORDER — ONDANSETRON HCL 4 MG PO TABS: 4.0000 mg | ORAL_TABLET | Freq: Three times a day (TID) | ORAL | 5 refills | Status: AC | PRN

## 2021-03-10 MED ORDER — AMITRIPTYLINE HCL 25 MG PO TABS: ORAL_TABLET | ORAL | 1 refills | Status: AC
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
## 2021-03-16 ENCOUNTER — Ambulatory Visit
Payer: No Typology Code available for payment source | Attending: Physical Medicine & Rehabilitation | Admitting: Speech Pathology

## 2021-03-16 ENCOUNTER — Encounter: Payer: Self-pay | Admitting: Speech Pathology

## 2021-03-16 ENCOUNTER — Other Ambulatory Visit: Payer: Self-pay

## 2021-03-16 DIAGNOSIS — R41841 Cognitive communication deficit: Secondary | ICD-10-CM | POA: Insufficient documentation
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
## 2021-03-16 NOTE — Patient Instructions (Signed)
     Checkers Chief [employer] Officer 4 [employer] games Jig saw puzzles Easy cross words Memory match Board games [employer]ino[employer]ong Learn a new game!  Listen to and discuss Ted Talks or Podcasts Read and discuss short articles of interest to you- Take notes on these if memory is a challenge Discuss social media posts Look and discuss photo albums  The best activities to improve cognition are functional, real life activities that are important to you:  Plan a menu Participate in household chores and decisions (with supervision) Participate in managing finances Plan a party, trip or tailgate with all of the details (even if you aren't really going to carry it out) Participate in your hobby as you are able with assistance Manage your texts, emails with supervision if needed. Google search for items (even if you're not really going to buy anything) and compare prices and features Socialize -  however, too many visitors can be overwhelming, so set limits "My doctor said I should only visit (or talk) for 20 minutes" or "I do better when I visit with just 1-2 people at a time for 20 minutes"    It's good to use real in-person games, not just apps

## 2021-03-21 ENCOUNTER — Ambulatory Visit: Payer: No Typology Code available for payment source | Attending: Physical Medicine & Rehabilitation

## 2021-03-21 ENCOUNTER — Other Ambulatory Visit: Payer: Self-pay

## 2021-03-21 DIAGNOSIS — R41841 Cognitive communication deficit: Secondary | ICD-10-CM | POA: Diagnosis present

## 2021-03-21 NOTE — Therapy (Signed)
[facility] [address] [place], [place], [address] Phone: [phone]   Fax:  [phone]  Speech Language Pathology Treatment  Patient Details  Name: Craig Hart MRN: [id number] Date of Birth: 10/16/1955 Referring Provider (SLP): Dr. Faith Rogue   Encounter Date: 03/21/2021   End of Session - 03/21/21 1805     Visit Number 2    Number of Visits 25    Date for SLP Re-Evaluation 06/08/21    Authorization Type WC - initially approved 6 visits, will request 18 visits    Authorization - Visit Number 2    Authorization - Number of Visits 6    SLP Start Time 1525    SLP Stop Time  1616    SLP Time Calculation (min) 51 min    Activity Tolerance Patient tolerated treatment well;Patient limited by fatigue             Past Medical History:  Diagnosis Date   Hypertension    Hypertensive emergency 09/21/2014    Past Surgical History:  Procedure Laterality Date   LEFT HEART CATHETERIZATION WITH CORONARY ANGIOGRAM N/A 09/21/2014   Procedure: LEFT HEART CATHETERIZATION WITH CORONARY ANGIOGRAM;  Surgeon: Tonny Bollman, MD;  Location: [facility] CATH LAB;  Service: Cardiovascular:  minimal LAD disease.  20-30% mid RCA disease with a right dominant system.  EF 65-70%. --False activation of ST elevation MI.   TONSILLECTOMY      There were no vitals filed for this visit.   Subjective Assessment - 03/21/21 1525     Subjective "didn't do too good this weekend"    Patient is accompained by: Family member    Currently in Pain? Yes    Pain Score 7     Pain Location Shoulder                   ADULT SLP TREATMENT - 03/21/21 1523       General Information   Behavior/Cognition Alert;Cooperative;Pleasant mood      Treatment Provided   Treatment provided Cognitive-Linquistic      Cognitive-Linquistic Treatment   Treatment focused on Cognition    Skilled Treatment Bodey was accompanied by his wife, [name]. Pt  unable to fully identify current cognitive challenges; therefore, [name] provided a multitude of examples of cognitive challenges pt has been experiencing since concussion in April 2021. She questioned his comprehension versus attention, as she often has to repeat information to him. She is concerned he is too dependent on her assistance; however, patient frustration, depression, and limited initiation were also indicated as concern for task completion. SLP guided discussion of how to identify goals that would engage and motivate patient. Pt reports he would like to return to cooking and working outdoors (wife reminded him that he has current physical restrictions). 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 SLP inquired why patient believes he has not completed invoices, in which pt expressed concern that he may mess up calculations (some anticipatory awareness). Pt does report getting distracted. SLP attempted to complete CLQT, in which pt requested break after design memory and mazes (~5-7 mins). SLP provided recommendations to use daily to-do lists of 2-3 items and timers to work for short amounts of time to aid attention to tasks at home.      Assessment / Recommendations / Plan   Plan Continue with current plan of care      Progression Toward Goals   Progression toward goals Progressing toward  goals              SLP Education - 03/21/21 1805     Education Details daily to-do lists, use of timers, take notes, look in ST folder every day    Person(s) Educated Patient;Spouse    Methods Explanation;Demonstration;Handout    Comprehension Verbalized understanding;Returned demonstration;Need further instruction              SLP Short Term Goals - 03/21/21 1524       SLP SHORT TERM GOAL #1   Title Will complete CLQT and modify goals as needed    Time 2    Period Weeks    Status On-going      SLP SHORT TERM GOAL #2   Title Pt will keep calendar with  appointments and daily schedule to complete 1 household task a day and 1 cognitive task a day for 10-15 minutes.    Time 6    Period Weeks    Status On-going      SLP SHORT TERM GOAL #3   Title Pt  will carryover 2 strategies for processing and recalling information from healthcare providers with occasional min A over 2 sessions    Time 6    Period Weeks    Status On-going      SLP SHORT TERM GOAL #4   Title Babyboy will attend to simple cognitive linguistic task for 15 minutes with 3 or less redirections over 3 sessions    Time 6    Period Weeks    Status On-going      SLP SHORT TERM GOAL #5   Title Pt will use pill organizer, alarm if needed or other compensation to manage his medication with no misses/mistakes over 1 week    Time 6    Period Weeks    Status On-going              SLP Long Term Goals - 03/21/21 1525       SLP LONG TERM GOAL #1   Title Pt will manage his schedule and time to be ready for appointtments and completed 2 chores a day and 2 cognitive activities for 15 minutes    Time 12    Period Weeks    Status On-going      SLP LONG TERM GOAL #2   Title Pt/family will carryover 3 compensations for pt to participate in  4 turns in a conversation or 4 instructions without need for repeptition    Time 12    Period Weeks    Status On-going      SLP LONG TERM GOAL #3   Title Pt will plan and carrout a visit with 1 friend/extended family member on 3 occasions for 20 minute visits with rare min A from sposue    Time 12    Period Weeks    Status On-going      SLP LONG TERM GOAL #4   Title Pt will alternate attention on cognitive linguistic task for 20 minutes with 3 or less redirections    Time 12    Period Weeks    Status On-going      SLP LONG TERM GOAL #5   Title [name] will improve score on Neuro-QOL Cognitive Function short form by 3 points    Baseline Malekai - 16; [name] - 13    Time 12    Period Weeks    Status On-going  Plan - 03/21/21 1523     Clinical Impression Statement Mathew Postiglione was referred for outpt ST due to cognitive impairments following a head injury at work. Prior to injury, Camerin was working full time and independent in all IADL's. He was accompanied by his spouse, [name]. Pt exhibited reduced awareness of cognitive limitations, in which wife provided various examples. SLP provided recommendations to use daily to-do lists and timers to aid attention and recall for functional tasks at home. Caydin would like to return to cooking and completing other household tasks. Skilled ST is warranted to address cognitive linguistic skills to maximize return to PLOF.    Speech Therapy Frequency 2x / week    Duration 12 weeks    Treatment/Interventions Compensatory strategies;Patient/family education;Functional tasks;Cueing hierarchy;Multimodal communcation approach;Cognitive reorganization;Environmental controls;SLP instruction and feedback;Internal/external aids;Compensatory techniques;Language facilitation    Potential to Achieve Goals Good    Potential Considerations Severity of impairments;Other (comment)   fatigue, difficulty sleeping at night   Consulted and Agree with Plan of Care Patient             Patient will benefit from skilled therapeutic intervention in order to improve the following deficits and impairments:   Cognitive communication deficit    Problem List Patient Active Problem List   Diagnosis Date Noted   Dizziness and giddiness 02/03/2021   Cervical spondylosis without myelopathy 05/06/2020   Myalgia 04/22/2020   Sleep disturbance 04/05/2020   Neck pain 04/05/2020   Post concussive syndrome 03/22/2020   Nausea without vomiting 03/22/2020   Chest pain 08/19/2017   Hypertensive urgency 08/19/2017   Chest pain with moderate risk for cardiac etiology    ST elevation    Hypokalemia 09/23/2014   Abnormal EKG 09/23/2014   Hyperlipidemia 09/23/2014   Accelerated  hypertension 09/21/2014    Janann Colonel, MA CCC-SLP 03/21/2021, 6:09 PM  [facility] [address] [place], [place], [address] Phone: [phone]   Fax:  [phone]   Name: CHAY MAZZONI MRN: [id number] Date of Birth: March 22, 1956

## 2021-03-21 NOTE — Patient Instructions (Addendum)
Write down a daily to-do list of 2-3 things. Check off things as you go.   Use timers to help you stay focused and get yourself started.   Write down notes or take-aways from documentaries    Build exercises/homework into your daily routine

## 2021-03-23 ENCOUNTER — Other Ambulatory Visit: Payer: Self-pay

## 2021-03-23 ENCOUNTER — Ambulatory Visit: Payer: No Typology Code available for payment source | Admitting: Speech Pathology

## 2021-03-23 DIAGNOSIS — R41841 Cognitive communication deficit: Secondary | ICD-10-CM

## 2021-03-23 NOTE — Patient Instructions (Addendum)
   Calendar - put appointments on it, keep to do list on or near  [place] of the day when it's over - look a head at night and reference it throughout the day  Have [name] remind you to look at the calendar or to do list, cross off the chore after you have double checked you completed it  Use a timer or alert on your phone to remind you to get the chore done (ie take clothes from washer and put in dryer)  3 ring binder with sections for PT, vestibular, ST and ST HW  No distractions with PT HEP, ST HW, anything that requires concentration - TV off, no interruption (unless it's a safety issue)

## 2021-03-23 NOTE — Therapy (Signed)
[facility] [address] [place], [place], [address] Phone: [phone]   Fax:  [phone]  Speech Language Pathology Treatment  Patient Details  Name: Craig Hart MRN: [id number] Date of Birth: [birth year] Referring Provider (SLP): Dr. Faith Rogue   Encounter Date: 03/23/2021   End of Session - 03/23/21 1228     Visit Number 3    Number of Visits 25    Date for SLP Re-Evaluation 06/08/21    Authorization Type WC - initially approved 6 visits, will request 18 visits    Authorization - Visit Number 3    Authorization - Number of Visits 6    SLP Start Time [phone]   pt arrived late   SLP Stop Time  0930    SLP Time Calculation (min) 38 min             Past Medical History:  Diagnosis Date   Hypertension    Hypertensive emergency 09/21/2014    Past Surgical History:  Procedure Laterality Date   LEFT HEART CATHETERIZATION WITH CORONARY ANGIOGRAM N/A 09/21/2014   Procedure: LEFT HEART CATHETERIZATION WITH CORONARY ANGIOGRAM;  Surgeon: Tonny Bollman, MD;  Location: [facility] CATH LAB;  Service: Cardiovascular:  minimal LAD disease.  20-30% mid RCA disease with a right dominant system.  EF 65-70%. --False activation of ST elevation MI.   TONSILLECTOMY      There were no vitals filed for this visit.   Subjective Assessment - 03/23/21 0854     Subjective "I changed the air filter and cleaned the fist"    Currently in Pain? Yes    Pain Score 7     Pain Location Shoulder    Pain Orientation Right    Pain Descriptors / Indicators Shooting    Pain Type Chronic pain    Pain Onset More than a month ago    Pain Frequency Constant                   ADULT SLP TREATMENT - 03/23/21 0857       General Information   Behavior/Cognition Alert;Cooperative;Pleasant mood      Treatment Provided   Treatment provided Cognitive-Linquistic      Cognitive-Linquistic Treatment   Treatment focused on  Cognition;Patient/family/caregiver education    Skilled Treatment Craig Hart completed 2 household chores. I did questoin safey as he climbed a ladder and used a knife to clean fish. We generated strategy of limiting distractions when concentrating on tasks, especially saftey concern tasks. He and Craig Hart required cues re; balance/vestibular issues affecting climbing a ladder. Today, we targeted Craig Hart keeping a schedule to reduce excessive sleeping, keeping naps to 1 hour and scheduling time for PT HEP's and cognitive activities. Craig Hart has not been completing HEP for PT and is not sure where his exercise sheets are. "Craig Hart probably has them." Instructed them to get a 3 ring binder and keep all rehab notes and exercises in 1 binder that Craig Hart needs to be responsible for. Compensatory strategies of usisng large calendar for appointments, to do lists (3 to do 's a day) and crossing off day and tasks initiated. Craig Hart reports poor initiation and some apathy at home affect Craig Hart's ability to complete tasks resulting in frustration. Completed Cognitive Linguistic Quick Test. See results in Clinical Impression. My demonstrated how to set a timer for 30 minutes in his phone to aid in recalling to take clothes out of wash and put into dryer with rare min A  Assessment / Recommendations / Plan   Plan Continue with current plan of care      Progression Toward Goals   Progression toward goals Progressing toward goals              SLP Education - 03/23/21 1223     Education Details calendar, to do list, combine rehab into binder, timer    Methods Explanation;Demonstration;Verbal cues;Handout    Comprehension Verbalized understanding;Verbal cues required;Need further instruction;Returned demonstration              SLP Short Term Goals - 03/23/21 1227       SLP SHORT TERM GOAL #1   Title Will complete CLQT and modify goals as needed    Time 2    Period Weeks    Status On-going      SLP  SHORT TERM GOAL #2   Title Pt will keep calendar with appointments and daily schedule to complete 1 household task a day and 1 cognitive task a day for 10-15 minutes.    Time 6    Period Weeks    Status On-going      SLP SHORT TERM GOAL #3   Title Pt  will carryover 2 strategies for processing and recalling information from healthcare providers with occasional min A over 2 sessions    Time 6    Period Weeks    Status On-going      SLP SHORT TERM GOAL #4   Title Craig Hart will attend to simple cognitive linguistic task for 15 minutes with 3 or less redirections over 3 sessions    Time 6    Period Weeks    Status On-going      SLP SHORT TERM GOAL #5   Title Pt will use pill organizer, alarm if needed or other compensation to manage his medication with no misses/mistakes over 1 week    Time 6    Period Weeks    Status On-going              SLP Long Term Goals - 03/23/21 1227       SLP LONG TERM GOAL #1   Title Pt will manage his schedule and time to be ready for appointtments and completed 2 chores a day and 2 cognitive activities for 15 minutes    Time 12    Period Weeks    Status On-going      SLP LONG TERM GOAL #2   Title Pt/family will carryover 3 compensations for pt to participate in  4 turns in a conversation or 4 instructions without need for repeptition    Time 12    Period Weeks    Status On-going      SLP LONG TERM GOAL #3   Title Pt will plan and carrout a visit with 1 friend/extended family member on 3 occasions for 20 minute visits with rare min A from sposue    Time 12    Period Weeks    Status On-going      SLP LONG TERM GOAL #4   Title Pt will alternate attention on cognitive linguistic task for 20 minutes with 3 or less redirections    Time 12    Period Weeks    Status On-going      SLP LONG TERM GOAL #5   Title [name] will improve score on Neuro-QOL Cognitive Function short form by 3 points    Baseline Craig Hart - 16; Craig Hart - 13    Time  12     Period Weeks    Status On-going              Plan - 03/23/21 1226     Clinical Impression Statement Craig Hart is referred for outpt ST due to cognitive impairments following a head injury at work. Prior to injury, Craig Hart was working full time and independent in all IADL's. He is accompanied by his spouse, Craig Hart. Pt exhibited reduced awareness of cognitive limitations, in which wife provided various examples. SLP provided recommendations to use daily to-do lists and timers to aid attention and recall for functional tasks at home. Craig Hart would like to return to cooking and completing other household tasks. Skilled ST is warranted to address cognitive linguistic skills to maximize return to PLOF. CLQT  completed, results: Attention - mild; Memory - moderate; Executive Functions - low WNL; Language mild; Visuospatial skills - Mild.    Speech Therapy Frequency 2x / week    Duration 12 weeks    Treatment/Interventions Compensatory strategies;Patient/family education;Functional tasks;Cueing hierarchy;Multimodal communcation approach;Cognitive reorganization;Environmental controls;SLP instruction and feedback;Internal/external aids;Compensatory techniques;Language facilitation    Potential to Achieve Goals Good             Patient will benefit from skilled therapeutic intervention in order to improve the following deficits and impairments:   Cognitive communication deficit    Problem List Patient Active Problem List   Diagnosis Date Noted   Dizziness and giddiness 02/03/2021   Cervical spondylosis without myelopathy 05/06/2020   Myalgia 04/22/2020   Sleep disturbance 04/05/2020   Neck pain 04/05/2020   Post concussive syndrome 03/22/2020   Nausea without vomiting 03/22/2020   Chest pain 08/19/2017   Hypertensive urgency 08/19/2017   Chest pain with moderate risk for cardiac etiology    ST elevation    Hypokalemia 09/23/2014   Abnormal EKG 09/23/2014   Hyperlipidemia  09/23/2014   Accelerated hypertension 09/21/2014    Shawana Knoch, Radene Journey MS, CCC-SLP 03/23/2021, 12:29 PM  [facility] [address] [place], [place], [address] Phone: [phone]   Fax:  [phone]   Name: LADAINIAN THERIEN MRN: [id number] Date of Birth: 1956-04-12

## 2021-03-28 ENCOUNTER — Ambulatory Visit: Payer: No Typology Code available for payment source | Admitting: Speech Pathology

## 2021-03-28 ENCOUNTER — Encounter: Payer: Self-pay | Admitting: Speech Pathology

## 2021-03-28 ENCOUNTER — Other Ambulatory Visit: Payer: Self-pay

## 2021-03-28 DIAGNOSIS — R41841 Cognitive communication deficit: Secondary | ICD-10-CM | POA: Diagnosis not present

## 2021-03-28 NOTE — Therapy (Signed)
[facility] [address] [place], [place], [address] Phone: [phone]   Fax:  [phone]  Speech Language Pathology Treatment  Patient Details  Name: Craig Hart MRN: [id number] Date of Birth: 06/14/1956 Referring Provider (SLP): Dr. Faith Rogue   Encounter Date: 03/28/2021   End of Session - 03/28/21 1050     Visit Number 4    Number of Visits 25    Date for SLP Re-Evaluation 06/08/21    Authorization Type WC - initially approved 6 visits, will request 18 visits    Authorization - Visit Number 4    Authorization - Number of Visits 6    SLP Start Time 0930    SLP Stop Time  1017    SLP Time Calculation (min) 47 min    Activity Tolerance Patient tolerated treatment well             Past Medical History:  Diagnosis Date   Hypertension    Hypertensive emergency 09/21/2014    Past Surgical History:  Procedure Laterality Date   LEFT HEART CATHETERIZATION WITH CORONARY ANGIOGRAM N/A 09/21/2014   Procedure: LEFT HEART CATHETERIZATION WITH CORONARY ANGIOGRAM;  Surgeon: Tonny Bollman, MD;  Location: [facility] CATH LAB;  Service: Cardiovascular:  minimal LAD disease.  20-30% mid RCA disease with a right dominant system.  EF 65-70%. --False activation of ST elevation MI.   TONSILLECTOMY      There were no vitals filed for this visit.   Subjective Assessment - 03/28/21 0935     Subjective "Tell the truth"    Patient is accompained by: Family member   Craig Hart, spouse   Currently in Pain? Yes    Pain Score 6     Pain Location Shoulder    Pain Orientation Right    Pain Descriptors / Indicators Shooting;Discomfort    Pain Type Chronic pain    Pain Onset More than a month ago    Pain Frequency Constant                   ADULT SLP TREATMENT - 03/28/21 1017       General Information   Behavior/Cognition Alert;Cooperative;Pleasant mood      Treatment Provided   Treatment provided  Cognitive-Linquistic      Cognitive-Linquistic Treatment   Treatment focused on Cognition;Patient/family/caregiver education    Skilled Treatment Craig Hart brought in binder. Craig Hart required 3 verbal cues to navigate the binder to locate his HW which was not completed. He did do a word search which he abandoned early due to head ache. We re-discussed use of timer to  work for at least 10-15 minutes on a cognitive task. Inititation contines to be poor, Craig Hart giving repeated and consistent verbal directions for Craig Hart to complete 1 task. This week, instructed her to tell Craig Hart to get his own snack or piece of fruit when he requests it, knowing that he will not get the snack, allow him to go without. Attention, working memory, reasoning targteted with mental money tasks reading car wash sign and fundraising sign. Craig Hart required verbal cues to ID details in the questions and cues for working memory. Simple mental money task correct 8/10 with cues for memory, organization and attention. He required verbal cues to ID 4/4 errors. For example, he stated 5x23 was 35 without awareness that this was not logical. We reviewed HW and he mis -read 3 words, rerqiuring verbal cues to ID erorrs 3/3x. He reading "baking aisle" and "backing astor"  when I asked him what that meant, he attempted to explain the nonsense words. With cue that the [facility] was about a grocery list, he re-read baking aisle correctly      Assessment / Recommendations / Plan   Plan Continue with current plan of care      Progression Toward Goals   Progression toward goals Progressing toward goals              SLP Education - 03/28/21 1048     Education Details Use to do list, complete ST HW and PT HEP    Person(s) Educated Patient;Spouse    Methods Explanation;Demonstration;Verbal cues;Handout    Comprehension Verbalized understanding;Returned demonstration;Verbal cues required;Need further instruction              SLP Short Term Goals  - 03/28/21 1049       SLP SHORT TERM GOAL #1   Title Will complete CLQT and modify goals as needed    Time 2    Period Weeks    Status Achieved      SLP SHORT TERM GOAL #2   Title Pt will keep calendar with appointments and daily schedule to complete 1 household task a day and 1 cognitive task a day for 10-15 minutes.    Time 5    Period Weeks    Status On-going      SLP SHORT TERM GOAL #3   Title Pt  will carryover 2 strategies for processing and recalling information from healthcare providers with occasional min A over 2 sessions    Time 5    Period Weeks    Status On-going      SLP SHORT TERM GOAL #4   Title Craig Hart will attend to simple cognitive linguistic task for 15 minutes with 3 or less redirections over 3 sessions    Time 5    Period Weeks    Status On-going      SLP SHORT TERM GOAL #5   Title Pt will use pill organizer, alarm if needed or other compensation to manage his medication with no misses/mistakes over 1 week    Time 5    Period Weeks    Status On-going              SLP Long Term Goals - 03/28/21 1050       SLP LONG TERM GOAL #1   Title Pt will manage his schedule and time to be ready for appointtments and completed 2 chores a day and 2 cognitive activities for 15 minutes    Time 11    Period Weeks    Status On-going      SLP LONG TERM GOAL #2   Title Pt/family will carryover 3 compensations for pt to participate in  4 turns in a conversation or 4 instructions without need for repeptition    Time 11    Period Weeks    Status On-going      SLP LONG TERM GOAL #3   Title Pt will plan and carrout a visit with 1 friend/extended family member on 3 occasions for 20 minute visits with rare min A from sposue    Time 11    Period Weeks    Status On-going      SLP LONG TERM GOAL #4   Title Pt will alternate attention on cognitive linguistic task for 20 minutes with 3 or less redirections    Time 11    Period Weeks    Status On-going  SLP  LONG TERM GOAL #5   Title [name] will improve score on Neuro-QOL Cognitive Function short form by 3 points    Baseline Craig Hart - 16; Craig Hart - 13    Time 11    Period Weeks    Status On-going              Plan - 03/28/21 1049     Clinical Impression Statement Craig Hart is referred for outpt ST due to cognitive impairments following a head injury at work. Prior to injury, Craig Hart was working full time and independent in all IADL's. He is accompanied by his spouse, Craig Hart. Pt exhibited reduced awareness of cognitive limitations, in which wife provided various examples. SLP provided recommendations to use daily to-do lists and timers to aid attention and recall for functional tasks at home. Saber would like to return to cooking and completing other household tasks. Skilled ST is warranted to address cognitive linguistic skills to maximize return to PLOF. CLQT  completed, results: Attention - mild; Memory - moderate; Executive Functions - low WNL; Language mild; Visuospatial skills - Mild.    Speech Therapy Frequency 2x / week    Duration 12 weeks    Treatment/Interventions Compensatory strategies;Patient/family education;Functional tasks;Cueing hierarchy;Multimodal communcation approach;Cognitive reorganization;Environmental controls;SLP instruction and feedback;Internal/external aids;Compensatory techniques;Language facilitation    Potential to Achieve Goals Fair    Potential Considerations Severity of impairments;Cooperation/participation level             Patient will benefit from skilled therapeutic intervention in order to improve the following deficits and impairments:   Cognitive communication deficit    Problem List Patient Active Problem List   Diagnosis Date Noted   Dizziness and giddiness 02/03/2021   Cervical spondylosis without myelopathy 05/06/2020   Myalgia 04/22/2020   Sleep disturbance 04/05/2020   Neck pain 04/05/2020   Post concussive syndrome  03/22/2020   Nausea without vomiting 03/22/2020   Chest pain 08/19/2017   Hypertensive urgency 08/19/2017   Chest pain with moderate risk for cardiac etiology    ST elevation    Hypokalemia 09/23/2014   Abnormal EKG 09/23/2014   Hyperlipidemia 09/23/2014   Accelerated hypertension 09/21/2014    Craig Hart, Craig Journey MS, CCC-SLP 03/28/2021, 10:53 AM  [facility] [address] [place], [place], [address] Phone: [phone]   Fax:  [phone]   Name: CORKY BLUMSTEIN MRN: [id number] Date of Birth: 1955/09/14

## 2021-03-28 NOTE — Patient Instructions (Addendum)
   Call Dr. Kieth Brightly weekly to check for cancellations  Continue daily to do list - add branches  Brain exercise - 15 to 20 minutes twice a day - use a timer on your phone  Do Brain work of your choice 1x a day then ST HW 1x a day

## 2021-03-30 ENCOUNTER — Ambulatory Visit: Payer: No Typology Code available for payment source | Admitting: Speech Pathology

## 2021-03-30 ENCOUNTER — Other Ambulatory Visit: Payer: Self-pay

## 2021-03-30 ENCOUNTER — Encounter: Payer: Self-pay | Admitting: Speech Pathology

## 2021-03-30 DIAGNOSIS — R41841 Cognitive communication deficit: Secondary | ICD-10-CM | POA: Diagnosis not present

## 2021-03-30 NOTE — Patient Instructions (Signed)
   Great job double checking your [employer] and finding mistakes - pay attention to spelling errors  Clean out texts June through March  Clean out email September through June  Play Spades  Keep up to do list - if you put something on to do list and it doesn't need done, you need to add something that does need to be done  Try to keep a day time schedule with ST HW, and PT exercises

## 2021-03-30 NOTE — Therapy (Signed)
[facility] [address] [place], [place], [address] Phone: [phone]   Fax:  [phone]  Speech Language Pathology Treatment  Patient Details  Name: Craig Hart MRN: [id number] Date of Birth: 04/04/1956 Referring Provider (SLP): Dr. Faith Rogue   Encounter Date: 03/30/2021   End of Session - 03/30/21 1035     Visit Number 5    Number of Visits 25    Date for SLP Re-Evaluation 06/08/21    Authorization Type WC - initially approved 6 visits, will request 18 visits    Authorization - Visit Number 5    Authorization - Number of Visits 6    SLP Start Time 0930    SLP Stop Time  1015    SLP Time Calculation (min) 45 min    Activity Tolerance Patient tolerated treatment well             Past Medical History:  Diagnosis Date   Hypertension    Hypertensive emergency 09/21/2014    Past Surgical History:  Procedure Laterality Date   LEFT HEART CATHETERIZATION WITH CORONARY ANGIOGRAM N/A 09/21/2014   Procedure: LEFT HEART CATHETERIZATION WITH CORONARY ANGIOGRAM;  Surgeon: Tonny Bollman, MD;  Location: [facility] CATH LAB;  Service: Cardiovascular:  minimal LAD disease.  20-30% mid RCA disease with a right dominant system.  EF 65-70%. --False activation of ST elevation MI.   TONSILLECTOMY      There were no vitals filed for this visit.   Subjective Assessment - 03/30/21 0937     Subjective "I just didn't sleep good"    Currently in Pain? Yes    Pain Score 8     Pain Location Shoulder    Pain Orientation Right    Pain Descriptors / Indicators Shooting;Sharp   deep   Pain Type Chronic pain    Pain Onset More than a month ago    Pain Frequency Constant                   ADULT SLP TREATMENT - 03/30/21 0938       General Information   Behavior/Cognition Alert;Cooperative;Pleasant mood      Treatment Provided   Treatment provided Cognitive-Linquistic      Cognitive-Linquistic Treatment    Treatment focused on Cognition;Patient/family/caregiver education    Skilled Treatment Craig Hart started a list but didn't follow through with either chores because "they were already done."  Craig Hart has not cleaned out e mail or texts. Targetd attention to detail and attention to task judging if text is important, junk or old. He required usual min to mod A to ID junk/spam, however he selected and deleted independenlty. HW to continue to clean out texts from June through March and emails September and August. Alternating attention in mildly complex card sort with converation interjected, Craig Hart reqiured cues to ID correct card for sort  4x even though he verbalized  the rules. Reviewed HW - Craig Hart double checked his HW independently at home, found an error and self corrected at home, demonstrating improved emergent awareness (double checking). 2 Speling errors required mod A to ID and correct. He wrote rich/ranch and despite cues, read it 3x as ranch until I pointed out he wrote rich.   I re-inforced this. When asked when his next appt was, Craig Hart independently looked at his calendar in his binder. despite days being crossed off, he required visual cue (looking at his phone) to ID todays date. He then ID'd next appointment on Monday  Assessment / Recommendations / Plan   Plan Continue with current plan of care      Progression Toward Goals   Progression toward goals Progressing toward goals              SLP Education - 03/30/21 1029     Education Details clean out texts and emails, play spades, add to do list, keep daily schedule with PT and ST exercises    Person(s) Educated Patient    Methods Explanation;Demonstration;Verbal cues;Handout    Comprehension Verbalized understanding;Returned demonstration;Verbal cues required;Need further instruction              SLP Short Term Goals - 03/30/21 1034       SLP SHORT TERM GOAL #1   Title Will complete CLQT and modify goals as needed     Time 2    Period Weeks    Status Achieved      SLP SHORT TERM GOAL #2   Title Pt will keep calendar with appointments and daily schedule to complete 1 household task a day and 1 cognitive task a day for 10-15 minutes.    Time 5    Period Weeks    Status On-going      SLP SHORT TERM GOAL #3   Title Pt  will carryover 2 strategies for processing and recalling information from healthcare providers with occasional min A over 2 sessions    Time 5    Period Weeks    Status On-going      SLP SHORT TERM GOAL #4   Title Craig Hart will attend to simple cognitive linguistic task for 15 minutes with 3 or less redirections over 3 sessions    Time 5    Period Weeks    Status On-going      SLP SHORT TERM GOAL #5   Title Pt will use pill organizer, alarm if needed or other compensation to manage his medication with no misses/mistakes over 1 week    Time 5    Period Weeks    Status On-going              SLP Long Term Goals - 03/30/21 1034       SLP LONG TERM GOAL #1   Title Pt will manage his schedule and time to be ready for appointtments and completed 2 chores a day and 2 cognitive activities for 15 minutes    Time 11    Period Weeks    Status On-going      SLP LONG TERM GOAL #2   Title Pt/family will carryover 3 compensations for pt to participate in  4 turns in a conversation or 4 instructions without need for repeptition    Time 11    Period Weeks    Status On-going      SLP LONG TERM GOAL #3   Title Pt will plan and carrout a visit with 1 friend/extended family member on 3 occasions for 20 minute visits with rare min A from sposue    Time 11    Period Weeks    Status On-going      SLP LONG TERM GOAL #4   Title Pt will alternate attention on cognitive linguistic task for 20 minutes with 3 or less redirections    Time 11    Period Weeks    Status On-going      SLP LONG TERM GOAL #5   Title [name] will improve score on Neuro-QOL Cognitive Function short form by  3  points    Baseline Craig Hart - 16; Craig Hart - 13    Time 11    Period Weeks    Status On-going              Plan - 03/30/21 1030     Clinical Impression Statement 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 Craig Hart has started to do list, but when chores didn't need to be done, he required cues by ST to revise the list for a chore that needed done. Awareness improving as Craig Hart double checked HW and ID'd/self corrected error. Craig Hart will continue to need supports for attention, memory and executive function  to complete IADL's, participate in household tasks. Continue skilled ST to maximize cognitive linguistc skills for safety, independence, participation in IALD's and reduce caregiver frustration.    Speech Therapy Frequency 2x / week    Duration 12 weeks    Treatment/Interventions Compensatory strategies;Patient/family education;Functional tasks;Cueing hierarchy;Multimodal communcation approach;Cognitive reorganization;Environmental controls;SLP instruction and feedback;Internal/external aids;Compensatory techniques;Language facilitation    Potential to Achieve Goals Fair    Potential Considerations Severity of impairments;Cooperation/participation level             Patient will benefit from skilled therapeutic intervention in order to improve the following deficits and impairments:   Cognitive communication deficit    Problem List Patient Active Problem List   Diagnosis Date Noted   Dizziness and giddiness 02/03/2021   Cervical spondylosis without myelopathy 05/06/2020   Myalgia 04/22/2020   Sleep disturbance 04/05/2020   Neck pain 04/05/2020   Post concussive syndrome 03/22/2020   Nausea without vomiting 03/22/2020   Chest pain 08/19/2017   Hypertensive urgency 08/19/2017   Chest pain with moderate risk for cardiac etiology    ST elevation    Hypokalemia 09/23/2014   Abnormal EKG 09/23/2014   Hyperlipidemia 09/23/2014   Accelerated hypertension 09/21/2014     Romelle Muldoon, Radene Journey MS, CCC-SLP 03/30/2021, 10:37 AM  [facility] [address] [place], [place], [address] Phone: [phone]   Fax:  [phone]   Name: NIKLAUS MAMARIL MRN: [id number] Date of Birth: 01-16-1956

## 2021-04-05 ENCOUNTER — Other Ambulatory Visit: Payer: Self-pay

## 2021-04-05 ENCOUNTER — Ambulatory Visit: Payer: No Typology Code available for payment source | Admitting: Speech Pathology

## 2021-04-05 DIAGNOSIS — R41841 Cognitive communication deficit: Secondary | ICD-10-CM

## 2021-04-05 NOTE — Therapy (Signed)
[facility] [place] [address] [place], [place], [address] Phone: [phone]   Fax:  [phone]  Speech Language Pathology Treatment  Patient Details  Name: Craig Hart MRN: [id number] Date of Birth: [birth year] Referring Provider (SLP): Dr. Faith Rogue   Encounter Date: 04/05/2021   End of Session - 04/05/21 1512     Visit Number 6    Number of Visits 25    Date for SLP Re-Evaluation 06/08/21    Authorization Type WC - initially approved 6 visits, will request 18 visits    Authorization - Visit Number 6    Authorization - Number of Visits 18    SLP Start Time 1230    SLP Stop Time  1315    SLP Time Calculation (min) 45 min    Activity Tolerance Patient tolerated treatment well             Past Medical History:  Diagnosis Date   Hypertension    Hypertensive emergency 09/21/2014    Past Surgical History:  Procedure Laterality Date   LEFT HEART CATHETERIZATION WITH CORONARY ANGIOGRAM N/A 09/21/2014   Procedure: LEFT HEART CATHETERIZATION WITH CORONARY ANGIOGRAM;  Surgeon: Tonny Bollman, MD;  Location: [facility] CATH LAB;  Service: Cardiovascular:  minimal LAD disease.  20-30% mid RCA disease with a right dominant system.  EF 65-70%. --False activation of ST elevation MI.   TONSILLECTOMY      There were no vitals filed for this visit.   Subjective Assessment - 04/05/21 1236     Subjective "I didn't sleep good last night"    Currently in Pain? Yes    Pain Score 8     Pain Location Shoulder    Pain Orientation Right    Pain Descriptors / Indicators Shooting;Sharp    Pain Type Chronic pain    Pain Onset More than a month ago    Pain Frequency Constant                   ADULT SLP TREATMENT - 04/05/21 1501       General Information   Behavior/Cognition Alert;Cooperative;Pleasant mood      Treatment Provided   Treatment provided Cognitive-Linquistic      Cognitive-Linquistic Treatment    Treatment focused on Cognition;Patient/family/caregiver education    Skilled Treatment Craig Hart returns with [facility] incomplete for 3rd session. He is not keeping a schedule and continues to sleep throughout the day and be awake at night. He is not completing tasks on to do list. Spouse frustrated. Craig Hart reqiures max A for intellectual awareness, consistently excusing or explaining away why he has made a mistake or not completed tasks. Generated a schedule and list of chores Craig Hart can do at home. His poor awareness leads to poor safety decisions, such as trying to shoot a ground hog or wanting to make house hold repairs on a ladder - max Verbal cues that he is in PT for balance and this is not safe. He reqired max A to recall prior conversation about doing HW. He used to cook with his wife. We generated a menu of 3 dinners to target awareness and organization. He had consistent spelling errors with frequent mod A to ID error and max A to correct. He wrote celit for salad with limited recognition. By the 3rd meal, Craig Hart ID'd errors with usual min to mod A, however continued to require max A to correct by copying the word, which still included errors he did not notice. For  HW Craig Hart is to make a grocery list for ingridients for the meals he chose.      Assessment / Recommendations / Plan   Plan Continue with current plan of care      Progression Toward Goals   Progression toward goals Progressing toward goals              SLP Education - 04/05/21 1509     Education Details Keep daily schedule, do PT twice a day and ST HW twice a day    Person(s) Educated Patient    Methods Explanation;Demonstration;Verbal cues;Handout    Comprehension Verbalized understanding;Returned demonstration;Verbal cues required;Need further instruction              SLP Short Term Goals - 04/05/21 1511       SLP SHORT TERM GOAL #1   Title Will complete CLQT and modify goals as needed    Time 2    Period Weeks     Status Achieved      SLP SHORT TERM GOAL #2   Title Pt will keep calendar with appointments and daily schedule to complete 1 household task a day and 1 cognitive task a day for 10-15 minutes.    Time 4    Period Weeks    Status On-going      SLP SHORT TERM GOAL #3   Title Pt  will carryover 2 strategies for processing and recalling information from healthcare providers with occasional min A over 2 sessions    Time 4    Period Weeks    Status On-going      SLP SHORT TERM GOAL #4   Title Craig Hart will attend to simple cognitive linguistic task for 15 minutes with 3 or less redirections over 3 sessions    Time 4    Period Weeks    Status On-going      SLP SHORT TERM GOAL #5   Title Pt will use pill organizer, alarm if needed or other compensation to manage his medication with no misses/mistakes over 1 week    Time 4    Period Weeks    Status On-going              SLP Long Term Goals - 04/05/21 1512       SLP LONG TERM GOAL #1   Title Pt will manage his schedule and time to be ready for appointtments and completed 2 chores a day and 2 cognitive activities for 15 minutes    Time 10    Period Weeks    Status On-going      SLP LONG TERM GOAL #2   Title Pt/family will carryover 3 compensations for pt to participate in  4 turns in a conversation or 4 instructions without need for repeptition    Time 10    Period Weeks    Status On-going      SLP LONG TERM GOAL #3   Title Pt will plan and carrout a visit with 1 friend/extended family member on 3 occasions for 20 minute visits with rare min A from sposue    Time 10    Period Weeks    Status On-going      SLP LONG TERM GOAL #4   Title Pt will alternate attention on cognitive linguistic task for 20 minutes with 3 or less redirections    Time 10    Period Weeks    Status On-going      SLP LONG TERM GOAL #5  Title [name] will improve score on Neuro-QOL Cognitive Function short form by 3 points    Baseline Craig Hart -  16; Craig Hart - 13    Time 10    Period Weeks    Status On-going              Plan - 04/05/21 1509     Clinical Impression Statement 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 Yichen has started to do list, but when chores didn't need to be done, he required cues by ST to revise the list for a chore that needed done. Awareness minimally improving with errors in our sessions. He has not completed ST HW over 2-3 sessions. He continues to sleep most of the day. Craig Hart with evident frustration with his lack of initiation and awareness.  Craig Hart will continue to need supports for attention, memory and executive function  to complete IADL's, participate in household tasks. Continue skilled ST to maximize cognitive linguistc skills for safety, independence, participation in IALD's and reduce caregiver frustration.    Speech Therapy Frequency 2x / week    Duration 12 weeks    Treatment/Interventions Compensatory strategies;Patient/family education;Functional tasks;Cueing hierarchy;Multimodal communcation approach;Cognitive reorganization;Environmental controls;SLP instruction and feedback;Internal/external aids;Compensatory techniques;Language facilitation    Potential to Achieve Goals Fair    Potential Considerations Severity of impairments;Cooperation/participation level             Patient will benefit from skilled therapeutic intervention in order to improve the following deficits and impairments:   Cognitive communication deficit    Problem List Patient Active Problem List   Diagnosis Date Noted   Dizziness and giddiness 02/03/2021   Cervical spondylosis without myelopathy 05/06/2020   Myalgia 04/22/2020   Sleep disturbance 04/05/2020   Neck pain 04/05/2020   Post concussive syndrome 03/22/2020   Nausea without vomiting 03/22/2020   Chest pain 08/19/2017   Hypertensive urgency 08/19/2017   Chest pain with moderate risk for cardiac etiology    ST elevation     Hypokalemia 09/23/2014   Abnormal EKG 09/23/2014   Hyperlipidemia 09/23/2014   Accelerated hypertension 09/21/2014    Craig Hart, Radene Journey MS, CCC-SLP 04/05/2021, 3:14 PM  [facility] [address] [place], [place], [address] Phone: [phone]   Fax:  [phone]   Name: Craig Hart MRN: [id number] Date of Birth: Nov 10, 1955

## 2021-04-07 ENCOUNTER — Ambulatory Visit: Payer: No Typology Code available for payment source | Admitting: Speech Pathology

## 2021-04-07 ENCOUNTER — Other Ambulatory Visit: Payer: Self-pay

## 2021-04-07 ENCOUNTER — Encounter: Payer: Self-pay | Admitting: Speech Pathology

## 2021-04-07 DIAGNOSIS — R41841 Cognitive communication deficit: Secondary | ICD-10-CM | POA: Diagnosis not present

## 2021-04-07 NOTE — Patient Instructions (Signed)
   Limit phone calls to 30-45 minutes so you don't get tired or overstimulated  Stores are very bright with lots of background noise and the products can also be confusing - listen to  your body - if you get overwhelmed wait outsite  Great going when there's not a lot of people  Hi, [name] - thank you for your feedback in [employer] list - if there's anything else he can do, add it on.

## 2021-04-07 NOTE — Therapy (Signed)
[facility] [address] [place], [place], [address] Phone: [phone]   Fax:  [phone]  Speech Language Pathology Treatment  Patient Details  Name: Craig Hart MRN: [id number] Date of Birth: 03-12-1956 Referring Provider (SLP): Dr. Faith Rogue   Encounter Date: 04/07/2021   End of Session - 04/07/21 1203     Visit Number 7    Number of Visits 25    Date for SLP Re-Evaluation 06/08/21    Authorization Type WC - initially approved 6 visits, will request 18 visits    Authorization - Visit Number 7    Authorization - Number of Visits 18    SLP Start Time 0930    SLP Stop Time  1014    SLP Time Calculation (min) 44 min    Activity Tolerance Patient tolerated treatment well             Past Medical History:  Diagnosis Date   Hypertension    Hypertensive emergency 09/21/2014    Past Surgical History:  Procedure Laterality Date   LEFT HEART CATHETERIZATION WITH CORONARY ANGIOGRAM N/A 09/21/2014   Procedure: LEFT HEART CATHETERIZATION WITH CORONARY ANGIOGRAM;  Surgeon: Tonny Bollman, MD;  Location: [facility] CATH LAB;  Service: Cardiovascular:  minimal LAD disease.  20-30% mid RCA disease with a right dominant system.  EF 65-70%. --False activation of ST elevation MI.   TONSILLECTOMY      There were no vitals filed for this visit.   Subjective Assessment - 04/07/21 0940     Subjective "I don't want to be around people  because I get angry"    Currently in Pain? Yes    Pain Score 8     Pain Location Shoulder    Pain Orientation Right    Pain Descriptors / Indicators Shooting;Sharp    Pain Type Chronic pain    Pain Onset More than a month ago    Pain Frequency Constant                   ADULT SLP TREATMENT - 04/07/21 0948       General Information   Behavior/Cognition Alert;Cooperative;Pleasant mood      Treatment Provided   Treatment provided Cognitive-Linquistic       Cognitive-Linquistic Treatment   Treatment focused on Cognition;Patient/family/caregiver education    Skilled Treatment Garlin brought back all of his prior [facility] completed, he completed 4 house hold chores on the list we generated and helped in the kitchen a little bit. Sarkis reports that he gets angry after he is around people for after about 30 minuts. He reports feeling nervous or tense, then has to leave or he lashes out. He lashed out at his son which was not warrented. Encouraged him to continue to listen to his body and let family know or leave the room when he feels himself getting overwhelmed. Wrote note home to family with strategies to help with this. Targeted attention to details and mental math in reading options for tickets and season tickets to men anad women's basketball games. Aidin required consistent verbal and visual cues to attend to all details in the quesitons and the chart. Mental math reqiured extended time and frequent min cues.      Assessment / Recommendations / Plan   Plan Continue with current plan of care      Progression Toward Goals   Progression toward goals Progressing toward goals  SLP Education - 04/07/21 1159     Education Details continue pushing throught with daily schedule instead of sleeping    Person(s) Educated Patient    Methods Explanation;Demonstration;Verbal cues;Handout    Comprehension Verbalized understanding;Returned demonstration;Verbal cues required;Need further instruction              SLP Short Term Goals - 04/07/21 1202       SLP SHORT TERM GOAL #1   Title Will complete CLQT and modify goals as needed    Time 2    Period Weeks    Status Achieved      SLP SHORT TERM GOAL #2   Title Pt will keep calendar with appointments and daily schedule to complete 1 household task a day and 1 cognitive task a day for 10-15 minutes.    Time 4    Period Weeks    Status On-going      SLP SHORT TERM GOAL #3   Title Pt   will carryover 2 strategies for processing and recalling information from healthcare providers with occasional min A over 2 sessions    Time 4    Period Weeks    Status On-going      SLP SHORT TERM GOAL #4   Title Samaj will attend to simple cognitive linguistic task for 15 minutes with 3 or less redirections over 3 sessions    Time 4    Period Weeks    Status On-going      SLP SHORT TERM GOAL #5   Title Pt will use pill organizer, alarm if needed or other compensation to manage his medication with no misses/mistakes over 1 week    Time 4    Period Weeks    Status On-going              SLP Long Term Goals - 04/07/21 1203       SLP LONG TERM GOAL #1   Title Pt will manage his schedule and time to be ready for appointtments and completed 2 chores a day and 2 cognitive activities for 15 minutes    Time 10    Period Weeks    Status On-going      SLP LONG TERM GOAL #2   Title Pt/family will carryover 3 compensations for pt to participate in  4 turns in a conversation or 4 instructions without need for repeptition    Time 10    Period Weeks    Status On-going      SLP LONG TERM GOAL #3   Title Pt will plan and carrout a visit with 1 friend/extended family member on 3 occasions for 20 minute visits with rare min A from sposue    Time 10    Period Weeks    Status On-going      SLP LONG TERM GOAL #4   Title Pt will alternate attention on cognitive linguistic task for 20 minutes with 3 or less redirections    Time 10    Period Weeks    Status On-going      SLP LONG TERM GOAL #5   Title [name] will improve score on Neuro-QOL Cognitive Function short form by 3 points    Baseline [name] - 16; [name] - 13    Time 10    Period Weeks    Status On-going              Plan - 04/07/21 1200     Clinical Impression Statement Mild to  Moderate cogntive linguistic impairments persist. Ancel brought back chore list with 3-4 chores done.. Awareness minimally improving  with errors in our sessions. He returned with all prior [facility] completed. He has started trying to push through sleepiness durng the  day [name] helped Lenox complete HW and helped in f/u with chores/schedule.  Walden will continue to need supports for attention, memory and executive function  to complete IADL's, participate in household tasks. Continue skilled ST to maximize cognitive linguistc skills for safety, independence, participation in IALD's and reduce caregiver frustration.    Speech Therapy Frequency 2x / week    Duration 12 weeks    Treatment/Interventions Compensatory strategies;Patient/family education;Functional tasks;Cueing hierarchy;Multimodal communcation approach;Cognitive reorganization;Environmental controls;SLP instruction and feedback;Internal/external aids;Compensatory techniques;Language facilitation    Potential to Achieve Goals Fair    Potential Considerations Severity of impairments;Cooperation/participation level             Patient will benefit from skilled therapeutic intervention in order to improve the following deficits and impairments:   Cognitive communication deficit    Problem List Patient Active Problem List   Diagnosis Date Noted   Dizziness and giddiness 02/03/2021   Cervical spondylosis without myelopathy 05/06/2020   Myalgia 04/22/2020   Sleep disturbance 04/05/2020   Neck pain 04/05/2020   Post concussive syndrome 03/22/2020   Nausea without vomiting 03/22/2020   Chest pain 08/19/2017   Hypertensive urgency 08/19/2017   Chest pain with moderate risk for cardiac etiology    ST elevation    Hypokalemia 09/23/2014   Abnormal EKG 09/23/2014   Hyperlipidemia 09/23/2014   Accelerated hypertension 09/21/2014    Jaxxson Cavanah, Radene Journey MS, CCC-SLP 04/07/2021, 12:05 PM  [facility] [address] [place], [place], [address] Phone: [phone]   Fax:  [phone]   Name: HAYDYN GIRVAN MRN: [id number] Date of Birth: [birth year]

## 2021-04-15 ENCOUNTER — Other Ambulatory Visit: Payer: Self-pay

## 2021-04-15 ENCOUNTER — Ambulatory Visit: Payer: No Typology Code available for payment source

## 2021-04-15 DIAGNOSIS — R41841 Cognitive communication deficit: Secondary | ICD-10-CM | POA: Diagnosis not present

## 2021-04-15 NOTE — Therapy (Signed)
[facility] [address] [place], [place], [address] Phone: [phone]   Fax:  [phone]  Speech Language Pathology Treatment  Patient Details  Name: Craig Hart MRN: [id number] Date of Birth: [birth year] Referring Provider (SLP): Dr. Faith Hart   Encounter Date: 04/15/2021   End of Session - 04/15/21 1825     Visit Number 8    Number of Visits 25    Date for SLP Re-Evaluation 06/08/21    Authorization Type WC - initially approved 6 visits, will request 18 visits    Authorization - Visit Number 8    Authorization - Number of Visits 18    SLP Start Time 1402    SLP Stop Time  1445    SLP Time Calculation (min) 43 min    Activity Tolerance Patient tolerated treatment well             Past Medical History:  Diagnosis Date   Hypertension    Hypertensive emergency 09/21/2014    Past Surgical History:  Procedure Laterality Date   LEFT HEART CATHETERIZATION WITH CORONARY ANGIOGRAM N/A 09/21/2014   Procedure: LEFT HEART CATHETERIZATION WITH CORONARY ANGIOGRAM;  Surgeon: Craig Bollman, MD;  Location: [facility] CATH LAB;  Service: Cardiovascular:  minimal LAD disease.  20-30% mid RCA disease with a right dominant system.  EF 65-70%. --False activation of ST elevation MI.   TONSILLECTOMY      There were no vitals filed for this visit.   Subjective Assessment - 04/15/21 1404     Subjective "I don't feel too good today"    Patient is accompained by: Family member    Currently in Pain? Yes    Pain Score 8     Pain Location Arm    Pain Orientation Left    Pain Descriptors / Indicators Aching                   ADULT SLP TREATMENT - 04/15/21 1407       General Information   Behavior/Cognition Alert;Cooperative;Pleasant mood      Treatment Provided   Treatment provided Cognitive-Linquistic      Cognitive-Linquistic Treatment   Treatment focused on Cognition;Patient/family/caregiver education     Skilled Treatment SLP reviewed homework, in which pt exhibited error x1. Pt attempted to correct error independently, which was unsuccessful. Pt stated "I know I need to simplify" which was good awareness. Pt unable to identify solution independently. Pt's wife reports she is only providing supervision and checking his homework after he finishes. Wife believes he is "rushing" which impacts his performance. Pt is accomplishing various household tasks, particularly those of interest. SLP provided to-do list for next session.      Assessment / Recommendations / Plan   Plan Continue with current plan of care      Progression Toward Goals   Progression toward goals Progressing toward goals              SLP Education - 04/15/21 1825     Education Details schedule, problem solving, HEP    Person(s) Educated Patient;Spouse    Methods Explanation;Demonstration;Verbal cues;Handout    Comprehension Verbalized understanding;Returned demonstration;Verbal cues required;Need further instruction              SLP Short Term Goals - 04/15/21 1829       SLP SHORT TERM GOAL #1   Title Will complete CLQT and modify goals as needed    Time 2    Period Weeks  Status Achieved      SLP SHORT TERM GOAL #2   Title Pt will keep calendar with appointments and daily schedule to complete 1 household task a day and 1 cognitive task a day for 10-15 minutes.    Time 3    Period Weeks    Status On-going      SLP SHORT TERM GOAL #3   Title Pt  will carryover 2 strategies for processing and recalling information from healthcare providers with occasional min A over 2 sessions    Time 3    Period Weeks    Status On-going      SLP SHORT TERM GOAL #4   Title Craig Hart will attend to simple cognitive linguistic task for 15 minutes with 3 or less redirections over 3 sessions    Time 3    Period Weeks    Status On-going      SLP SHORT TERM GOAL #5   Title Pt will use pill organizer, alarm if needed or  other compensation to manage his medication with no misses/mistakes over 1 week    Time 3    Period Weeks    Status On-going              SLP Long Term Goals - 04/15/21 1829       SLP LONG TERM GOAL #1   Title Pt will manage his schedule and time to be ready for appointtments and completed 2 chores a day and 2 cognitive activities for 15 minutes    Time 9    Period Weeks    Status On-going      SLP LONG TERM GOAL #2   Title Pt/family will carryover 3 compensations for pt to participate in  4 turns in a conversation or 4 instructions without need for repeptition    Time 9    Period Weeks    Status On-going      SLP LONG TERM GOAL #3   Title Pt will plan and carrout a visit with 1 friend/extended family member on 3 occasions for 20 minute visits with rare min A from sposue    Time 9    Period Weeks    Status On-going      SLP LONG TERM GOAL #4   Title Pt will alternate attention on cognitive linguistic task for 20 minutes with 3 or less redirections    Time 9    Period Weeks    Status On-going      SLP LONG TERM GOAL #5   Title [name] will improve score on Neuro-QOL Cognitive Function short form by 3 points    Baseline Craig Hart - 16; Craig Hart - 13    Time 9    Period Weeks    Status On-going              Plan - 04/15/21 1826     Clinical Impression Statement 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 Craig Hart brought back chore list with 3-4 chores done. Some awareness of errors noted this session, but limited problem solving exhibited. Less frequent naps reported during day, as wife does not let him "idle."  Craig Hart will continue to need supports for attention, memory and executive function to complete IADL's, participate in household tasks. Continue skilled ST to maximize cognitive linguistc skills for safety, independence, participation in IALD's and reduce caregiver frustration.    Speech Therapy Frequency 2x / week    Duration 12 weeks     Treatment/Interventions Compensatory  strategies;Patient/family education;Functional tasks;Cueing hierarchy;Multimodal communcation approach;Cognitive reorganization;Environmental controls;SLP instruction and feedback;Internal/external aids;Compensatory techniques;Language facilitation    Potential to Achieve Goals Fair    Potential Considerations Severity of impairments;Cooperation/participation level    Consulted and Agree with Plan of Care Patient;Family member/caregiver             Patient will benefit from skilled therapeutic intervention in order to improve the following deficits and impairments:   Cognitive communication deficit    Problem List Patient Active Problem List   Diagnosis Date Noted   Dizziness and giddiness 02/03/2021   Cervical spondylosis without myelopathy 05/06/2020   Myalgia 04/22/2020   Sleep disturbance 04/05/2020   Neck pain 04/05/2020   Post concussive syndrome 03/22/2020   Nausea without vomiting 03/22/2020   Chest pain 08/19/2017   Hypertensive urgency 08/19/2017   Chest pain with moderate risk for cardiac etiology    ST elevation    Hypokalemia 09/23/2014   Abnormal EKG 09/23/2014   Hyperlipidemia 09/23/2014   Accelerated hypertension 09/21/2014    Janann Colonel, MA CCC-SLP 04/15/2021, 6:30 PM  [place] [facility] [address] [place], [place], [address] Phone: [phone]   Fax:  [phone]   Name: Craig Hart MRN: [id number] Date of Birth: 11-Jan-1956

## 2021-04-18 ENCOUNTER — Other Ambulatory Visit: Payer: Self-pay

## 2021-04-18 ENCOUNTER — Ambulatory Visit: Payer: No Typology Code available for payment source

## 2021-04-18 DIAGNOSIS — R41841 Cognitive communication deficit: Secondary | ICD-10-CM | POA: Diagnosis not present

## 2021-04-18 NOTE — Therapy (Signed)
[facility] [address] [place], [place], [address] Phone: [phone]   Fax:  [phone]  Speech Language Pathology Treatment  Patient Details  Name: Craig Hart MRN: [id number] Date of Birth: [birth year] Referring Provider (SLP): Dr. Faith Rogue   Encounter Date: 04/18/2021   End of Session - 04/18/21 1242     Visit Number 9    Number of Visits 25    Date for SLP Re-Evaluation 06/08/21    Authorization Type WC - initially approved 6 visits, will request 18 visits    Authorization - Visit Number 9    Authorization - Number of Visits 18    SLP Start Time 1232    SLP Stop Time  1315    SLP Time Calculation (min) 43 min    Activity Tolerance Patient tolerated treatment well             Past Medical History:  Diagnosis Date   Hypertension    Hypertensive emergency 09/21/2014    Past Surgical History:  Procedure Laterality Date   LEFT HEART CATHETERIZATION WITH CORONARY ANGIOGRAM N/A 09/21/2014   Procedure: LEFT HEART CATHETERIZATION WITH CORONARY ANGIOGRAM;  Surgeon: Tonny Bollman, MD;  Location: [facility] CATH LAB;  Service: Cardiovascular:  minimal LAD disease.  20-30% mid RCA disease with a right dominant system.  EF 65-70%. --False activation of ST elevation MI.   TONSILLECTOMY      There were no vitals filed for this visit.   Subjective Assessment - 04/18/21 1234     Subjective "we went to a birthday party"    Patient is accompained by: Family member    Currently in Pain? Yes    Pain Score 8     Pain Location Wrist                   ADULT SLP TREATMENT - 04/18/21 1240       General Information   Behavior/Cognition Alert;Cooperative;Pleasant mood      Treatment Provided   Treatment provided Cognitive-Linquistic      Cognitive-Linquistic Treatment   Treatment focused on Cognition;Patient/family/caregiver education    Skilled Treatment Completed 2/3 items on to-do list, in which  wife reported other item was not completed secondary to her. SLP trialed error awareness tasks of identifying errors on menu. Pt identifying mistake x1 independently upon presentation of instructions. SLP provided rephrasing and repetition x2, which improved pt's comprehension with ID of x6. SLP provided intermittent min to mod A to ID x19 errors. Pt's wife reported frustration related to poor performance on simple tasks, in which SLP educated patient and wife on suspected reduced awareness and need to facilitate increased awareness to change behaviors. SLP demonstrated how to increase patient awareness on structured tasks.      Assessment / Recommendations / Plan   Plan Continue with current plan of care      Progression Toward Goals   Progression toward goals Progressing toward goals              SLP Education - 04/18/21 1512     Education Details awareness, HEP    Person(s) Educated Patient;Spouse    Methods Explanation;Demonstration;Tactile cues;Handout    Comprehension Verbalized understanding;Returned demonstration;Verbal cues required;Need further instruction              SLP Short Term Goals - 04/18/21 1242       SLP SHORT TERM GOAL #1   Title Will complete CLQT and modify goals as needed  Time 2    Period Weeks    Status Achieved      SLP SHORT TERM GOAL #2   Title Pt will keep calendar with appointments and daily schedule to complete 1 household task a day and 1 cognitive task a day for 10-15 minutes.    Time 2    Period Weeks    Status On-going      SLP SHORT TERM GOAL #3   Title Pt  will carryover 2 strategies for processing and recalling information from healthcare providers with occasional min A over 2 sessions    Time 2    Period Weeks    Status On-going      SLP SHORT TERM GOAL #4   Title Solace will attend to simple cognitive linguistic task for 15 minutes with 3 or less redirections over 3 sessions    Baseline 04-18-21    Time 2    Period Weeks     Status On-going      SLP SHORT TERM GOAL #5   Title Pt will use pill organizer, alarm if needed or other compensation to manage his medication with no misses/mistakes over 1 week    Time 2    Period Weeks    Status On-going              SLP Long Term Goals - 04/18/21 1243       SLP LONG TERM GOAL #1   Title Pt will manage his schedule and time to be ready for appointtments and completed 2 chores a day and 2 cognitive activities for 15 minutes    Time 8    Period Weeks    Status On-going      SLP LONG TERM GOAL #2   Title Pt/family will carryover 3 compensations for pt to participate in  4 turns in a conversation or 4 instructions without need for repeptition    Time 8    Period Weeks    Status On-going      SLP LONG TERM GOAL #3   Title Pt will plan and carrout a visit with 1 friend/extended family member on 3 occasions for 20 minute visits with rare min A from sposue    Time 8    Period Weeks    Status On-going      SLP LONG TERM GOAL #4   Title Pt will alternate attention on cognitive linguistic task for 20 minutes with 3 or less redirections    Time 8    Period Weeks    Status On-going      SLP LONG TERM GOAL #5   Title [name] will improve score on Neuro-QOL Cognitive Function short form by 3 points    Baseline Dickson - 16; [name] - 13    Time 8    Period Weeks    Status On-going              Plan - 04/18/21 1242     Clinical Impression Statement 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 Darrol brought back chore list with 2 chores done. Rare awareness of errors noted this session on structured task. SLP demonstrated how to increase patient awareness of errors and how to support problem solving as needed.  Achilles will continue to need supports for attention, memory and executive function to complete IADL's, participate in household tasks. Continue skilled ST to maximize cognitive linguistc skills for safety, independence,  participation in IALD's and reduce caregiver frustration.  Speech Therapy Frequency 2x / week    Duration 12 weeks    Treatment/Interventions Compensatory strategies;Patient/family education;Functional tasks;Cueing hierarchy;Multimodal communcation approach;Cognitive reorganization;Environmental controls;SLP instruction and feedback;Internal/external aids;Compensatory techniques;Language facilitation    Potential to Achieve Goals Fair    Potential Considerations Severity of impairments;Cooperation/participation level    Consulted and Agree with Plan of Care Patient;Family member/caregiver             Patient will benefit from skilled therapeutic intervention in order to improve the following deficits and impairments:   Cognitive communication deficit    Problem List Patient Active Problem List   Diagnosis Date Noted   Dizziness and giddiness 02/03/2021   Cervical spondylosis without myelopathy 05/06/2020   Myalgia 04/22/2020   Sleep disturbance 04/05/2020   Neck pain 04/05/2020   Post concussive syndrome 03/22/2020   Nausea without vomiting 03/22/2020   Chest pain 08/19/2017   Hypertensive urgency 08/19/2017   Chest pain with moderate risk for cardiac etiology    ST elevation    Hypokalemia 09/23/2014   Abnormal EKG 09/23/2014   Hyperlipidemia 09/23/2014   Accelerated hypertension 09/21/2014    Janann Colonel, MA CCC-SLP 04/18/2021, 3:14 PM  [facility] [address] [place], [place], [address] Phone: [phone]   Fax:  [phone]   Name: LAMARR FEENSTRA MRN: [id number] Date of Birth: 09-06-1955

## 2021-04-22 ENCOUNTER — Ambulatory Visit: Payer: No Typology Code available for payment source | Attending: Physical Medicine & Rehabilitation

## 2021-04-22 ENCOUNTER — Other Ambulatory Visit: Payer: Self-pay

## 2021-04-22 DIAGNOSIS — R41841 Cognitive communication deficit: Secondary | ICD-10-CM

## 2021-04-22 NOTE — Therapy (Signed)
[facility] [address] [place], [place], [address] Phone: [phone]   Fax:  [phone]  Speech Language Pathology Treatment  Patient Details  Name: Craig Hart MRN: [id number] Date of Birth: 06-13-1956 Referring Provider (SLP): Dr. Faith Rogue   Encounter Date: 04/22/2021   End of Session - 04/22/21 1501     Visit Number 10    Number of Visits 25    Date for SLP Re-Evaluation 06/08/21    Authorization Type WC - initially approved 6 visits, will request 18 visits    Authorization - Visit Number 10    Authorization - Number of Visits 18    SLP Start Time 1448    SLP Stop Time  1528    SLP Time Calculation (min) 40 min    Activity Tolerance Patient tolerated treatment well             Past Medical History:  Diagnosis Date   Hypertension    Hypertensive emergency 09/21/2014    Past Surgical History:  Procedure Laterality Date   LEFT HEART CATHETERIZATION WITH CORONARY ANGIOGRAM N/A 09/21/2014   Procedure: LEFT HEART CATHETERIZATION WITH CORONARY ANGIOGRAM;  Surgeon: Tonny Bollman, MD;  Location: [facility] CATH LAB;  Service: Cardiovascular:  minimal LAD disease.  20-30% mid RCA disease with a right dominant system.  EF 65-70%. --False activation of ST elevation MI.   TONSILLECTOMY      There were no vitals filed for this visit.   Subjective Assessment - 04/22/21 1449     Subjective "I'm okay"    Currently in Pain? Yes    Pain Score 7     Pain Location Arm                   ADULT SLP TREATMENT - 04/22/21 1451       General Information   Behavior/Cognition Alert;Cooperative;Pleasant mood      Treatment Provided   Treatment provided Cognitive-Linquistic      Cognitive-Linquistic Treatment   Treatment focused on Cognition;Patient/family/caregiver education    Skilled Treatment Pt completed HEP with wife checking it. Pt did not abide by written recommendation to work slowly. Pt's wife  reportedly cued patient to slow down. Browsing computer reported for information related to cars. Engaged patient in conversation re: electric cars with seemingly good recall exhibited for topic of interest. SLP targeted attention to detail and comprehension for identifying true/false statements, in which pt benefited from occasional re-reading to aid attention. SLP cued double check, which was not effective to identify error x1. SLP provided min verbal prompt, in which pt identified error. Pt independently double checked next task without SLP cue. Errors x 2 were not independently identified without SLP cues. SLP targeted identification and modifcation of sentence incongruitites. Occasional min A required to ID the errors and usual moderate A required to correct spelling. Pt will write written summary of documentary watched this weekend for next session.      Assessment / Recommendations / Plan   Plan Continue with current plan of care      Progression Toward Goals   Progression toward goals Progressing toward goals              SLP Education - 04/22/21 1500     Education Details HEP, double check    Person(s) Educated Patient    Methods Explanation;Demonstration;Handout    Comprehension Verbalized understanding;Returned demonstration;Need further instruction  SLP Short Term Goals - 04/22/21 1501       SLP SHORT TERM GOAL #1   Title Will complete CLQT and modify goals as needed    Time 2    Period Weeks    Status Achieved      SLP SHORT TERM GOAL #2   Title Pt will keep calendar with appointments and daily schedule to complete 1 household task a day and 1 cognitive task a day for 10-15 minutes.    Time 2    Period Weeks    Status On-going      SLP SHORT TERM GOAL #3   Title Pt  will carryover 2 strategies for processing and recalling information from healthcare providers with occasional min A over 2 sessions    Time 2    Period Weeks    Status On-going       SLP SHORT TERM GOAL #4   Title Gilmore will attend to simple cognitive linguistic task for 15 minutes with 3 or less redirections over 3 sessions    Baseline 04-18-21, 04-22-21    Time 2    Period Weeks    Status On-going      SLP SHORT TERM GOAL #5   Title Pt will use pill organizer, alarm if needed or other compensation to manage his medication with no misses/mistakes over 1 week    Time 2    Period Weeks    Status On-going              SLP Long Term Goals - 04/22/21 1502       SLP LONG TERM GOAL #1   Title Pt will manage his schedule and time to be ready for appointtments and completed 2 chores a day and 2 cognitive activities for 15 minutes    Time 8    Period Weeks    Status On-going      SLP LONG TERM GOAL #2   Title Pt/family will carryover 3 compensations for pt to participate in  4 turns in a conversation or 4 instructions without need for repeptition    Time 8    Period Weeks    Status On-going      SLP LONG TERM GOAL #3   Title Pt will plan and carrout a visit with 1 friend/extended family member on 3 occasions for 20 minute visits with rare min A from sposue    Time 8    Period Weeks    Status On-going      SLP LONG TERM GOAL #4   Title Pt will alternate attention on cognitive linguistic task for 20 minutes with 3 or less redirections    Time 8    Period Weeks    Status On-going      SLP LONG TERM GOAL #5   Title [name] will improve score on Neuro-QOL Cognitive Function short form by 3 points    Baseline Bacilio - 16; [name] - 13    Time 8    Period Weeks    Status On-going              Plan - 04/22/21 1501     Clinical Impression Statement 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 Idrees arrived alone this session. He has completed homework with good consistency and returned with binder. Rare independent awareness of errors noted this session on structured tasks. Dario will continue to need supports for attention,  memory and executive function to complete IADL's, participate in  household tasks. Continue skilled ST to maximize cognitive linguistc skills for safety, independence, participation in IALD's and reduce caregiver frustration.    Speech Therapy Frequency 2x / week    Duration 12 weeks    Treatment/Interventions Compensatory strategies;Patient/family education;Functional tasks;Cueing hierarchy;Multimodal communcation approach;Cognitive reorganization;Environmental controls;SLP instruction and feedback;Internal/external aids;Compensatory techniques;Language facilitation    Potential to Achieve Goals Fair    Potential Considerations Severity of impairments;Cooperation/participation level    Consulted and Agree with Plan of Care Patient             Patient will benefit from skilled therapeutic intervention in order to improve the following deficits and impairments:   Cognitive communication deficit    Problem List Patient Active Problem List   Diagnosis Date Noted   Dizziness and giddiness 02/03/2021   Cervical spondylosis without myelopathy 05/06/2020   Myalgia 04/22/2020   Sleep disturbance 04/05/2020   Neck pain 04/05/2020   Post concussive syndrome 03/22/2020   Nausea without vomiting 03/22/2020   Chest pain 08/19/2017   Hypertensive urgency 08/19/2017   Chest pain with moderate risk for cardiac etiology    ST elevation    Hypokalemia 09/23/2014   Abnormal EKG 09/23/2014   Hyperlipidemia 09/23/2014   Accelerated hypertension 09/21/2014    Janann Colonel, MA CCC-SLP 04/22/2021, 3:32 PM  [facility] [address] [place], [place], [address] Phone: [phone]   Fax:  [phone]   Name: RAFIEL MECCA MRN: [id number] Date of Birth: 1955/11/26

## 2021-04-22 NOTE — Patient Instructions (Signed)
Watch a documentary this weekend and provide written summary of what you have you learned. You can take notes during the doc if you need help remembering (notepad and pencil by you if you need it).   Double check your texts before you send them

## 2021-04-25 ENCOUNTER — Encounter: Payer: Self-pay | Admitting: Speech Pathology

## 2021-04-25 ENCOUNTER — Other Ambulatory Visit: Payer: Self-pay

## 2021-04-25 ENCOUNTER — Ambulatory Visit: Payer: No Typology Code available for payment source | Admitting: Speech Pathology

## 2021-04-25 DIAGNOSIS — R41841 Cognitive communication deficit: Secondary | ICD-10-CM

## 2021-04-25 NOTE — Therapy (Signed)
[facility] [address] [place], [place], [address] Phone: [phone]   Fax:  [phone]  Speech Language Pathology Treatment  Patient Details  Name: Craig Hart MRN: [id number] Date of Birth: Sep 15, 1955 Referring Provider (SLP): Dr. Faith Rogue   Encounter Date: 04/25/2021   End of Session - 04/25/21 1147     Visit Number 11    Number of Visits 25    Date for SLP Re-Evaluation 06/08/21    Authorization Type WC - 18    Authorization - Visit Number 11    Authorization - Number of Visits 18    SLP Start Time 0932    SLP Stop Time  1015    SLP Time Calculation (min) 43 min    Activity Tolerance Patient tolerated treatment well             Past Medical History:  Diagnosis Date   Hypertension    Hypertensive emergency 09/21/2014    Past Surgical History:  Procedure Laterality Date   LEFT HEART CATHETERIZATION WITH CORONARY ANGIOGRAM N/A 09/21/2014   Procedure: LEFT HEART CATHETERIZATION WITH CORONARY ANGIOGRAM;  Surgeon: Tonny Bollman, MD;  Location: [facility] CATH LAB;  Service: Cardiovascular:  minimal LAD disease.  20-30% mid RCA disease with a right dominant system.  EF 65-70%. --False activation of ST elevation MI.   TONSILLECTOMY      There were no vitals filed for this visit.   Subjective Assessment - 04/25/21 0936     Subjective "We ate something bad and got sick, it's been one of them weekends"    Currently in Pain? Yes    Pain Score 8     Pain Location Arm    Pain Orientation Left    Pain Descriptors / Indicators Aching    Pain Type Chronic pain    Pain Onset More than a month ago    Pain Frequency Constant                   ADULT SLP TREATMENT - 04/25/21 0937       General Information   Behavior/Cognition Alert;Cooperative;Pleasant mood      Treatment Provided   Treatment provided Cognitive-Linquistic      Cognitive-Linquistic Treatment   Treatment focused on  Cognition;Patient/family/caregiver education    Skilled Treatment Craig Hart did not complete HW due to illness. He required min cue  to at his phone to Id correct date (he intially said the 4th). Attention, error awarenesa generating list of people he buys Christmas gifts for and a possible gift Idea.  He ID'd  3/5  errors independnelty and 2/5 with cues. With cues, Craig Hart used speaking his phone contancts and togoogle to self correct spelling errors successfully after intiital cueing to do so, On numbering his list, he repeated 8,9 twice instead of numbering 10 and 11. He required mod A to ID and correct this error. Craig Hart received phone call during ST to pick up supplies to make a vinyl sign he is making for church. He verbalized how he will enter the address in GPS (verbally due to typing and spelling errors) and that he will double check address and route before driving to the place in [place]. He plans on using computer program to make sign/train design. Craig Hart demonstrates anticiaptory awareness that he will have his son drive him to [facility] because it is too far from home.      Assessment / Recommendations / Plan   Plan Continue with  current plan of care      Progression Toward Goals   Progression toward goals Progressing toward goals                SLP Short Term Goals - 04/25/21 1146       SLP SHORT TERM GOAL #1   Title Will complete CLQT and modify goals as needed    Time 2    Period Weeks    Status Achieved      SLP SHORT TERM GOAL #2   Title Pt will keep calendar with appointments and daily schedule to complete 1 household task a day and 1 cognitive task a day for 10-15 minutes.    Time 1    Period Weeks    Status On-going      SLP SHORT TERM GOAL #3   Title Pt  will carryover 2 strategies for processing and recalling information from healthcare providers with occasional min A over 2 sessions    Time 1    Period Weeks    Status On-going      SLP SHORT TERM GOAL  #4   Title Craig Hart will attend to simple cognitive linguistic task for 15 minutes with 3 or less redirections over 3 sessions    Baseline 04-18-21, 04-22-21; 04-25-21    Time 2    Period Weeks    Status Achieved      SLP SHORT TERM GOAL #5   Title Pt will use pill organizer, alarm if needed or other compensation to manage his medication with no misses/mistakes over 1 week    Time 1    Period Weeks    Status On-going              SLP Long Term Goals - 04/25/21 1146       SLP LONG TERM GOAL #1   Title Pt will manage his schedule and time to be ready for appointtments and completed 2 chores a day and 2 cognitive activities for 15 minutes    Time 7    Period Weeks    Status On-going      SLP LONG TERM GOAL #2   Title Pt/family will carryover 3 compensations for pt to participate in  4 turns in a conversation or 4 instructions without need for repeptition    Time 7    Period Weeks    Status On-going      SLP LONG TERM GOAL #3   Title Pt will plan and carrout a visit with 1 friend/extended family member on 3 occasions for 20 minute visits with rare min A from sposue    Time 7    Period Weeks    Status On-going      SLP LONG TERM GOAL #4   Title Pt will alternate attention on cognitive linguistic task for 20 minutes with 3 or less redirections    Time 7    Period Weeks    Status On-going      SLP LONG TERM GOAL #5   Title [name] will improve score on Neuro-QOL Cognitive Function short form by 3 points    Baseline Craig Hart - 16; Craig Hart - 13    Time 7    Period Weeks    Status On-going              Plan - 04/25/21 1027     Clinical Impression Statement Craig Hart continues to bring his binder and use this to complete ST HW. He verbalized safety  awareness with asking his son to drive him long distances. Error awareness conitnues to required mod A. Craig Hart carried over strategies of using phone to check and correct spelling of family names. Mild to mod cognitive  lingusitic impairments persist. Continue skilled ST to maximize cognition for safety    Speech Therapy Frequency 2x / week    Duration 12 weeks    Treatment/Interventions Compensatory strategies;Patient/family education;Functional tasks;Cueing hierarchy;Multimodal communcation approach;Cognitive reorganization;Environmental controls;SLP instruction and feedback;Internal/external aids;Compensatory techniques;Language facilitation    Potential to Achieve Goals Good             Patient will benefit from skilled therapeutic intervention in order to improve the following deficits and impairments:   Cognitive communication deficit    Problem List Patient Active Problem List   Diagnosis Date Noted   Dizziness and giddiness 02/03/2021   Cervical spondylosis without myelopathy 05/06/2020   Myalgia 04/22/2020   Sleep disturbance 04/05/2020   Neck pain 04/05/2020   Post concussive syndrome 03/22/2020   Nausea without vomiting 03/22/2020   Chest pain 08/19/2017   Hypertensive urgency 08/19/2017   Chest pain with moderate risk for cardiac etiology    ST elevation    Hypokalemia 09/23/2014   Abnormal EKG 09/23/2014   Hyperlipidemia 09/23/2014   Accelerated hypertension 09/21/2014    Craig Hart, Craig Journey MS, CCC-SLP 04/25/2021, 11:48 AM  [facility] [address] [place], [place], [address] Phone: [phone]   Fax:  [phone]   Name: PENG THORSTENSON MRN: [id number] Date of Birth: 1955-07-28

## 2021-04-25 NOTE — Patient Instructions (Signed)
   Continue to use your contacts and Google to help with spelling  Maybe start to organize and label your gifts - ask [name] first if this is OK, it may make too much clutter

## 2021-04-27 ENCOUNTER — Other Ambulatory Visit: Payer: Self-pay

## 2021-04-27 ENCOUNTER — Ambulatory Visit: Payer: No Typology Code available for payment source | Admitting: Speech Pathology

## 2021-04-27 ENCOUNTER — Encounter: Payer: Self-pay | Admitting: Speech Pathology

## 2021-04-27 DIAGNOSIS — R41841 Cognitive communication deficit: Secondary | ICD-10-CM

## 2021-04-27 NOTE — Therapy (Signed)
[facility] [address] [place], [place], [address] Phone: [phone]   Fax:  [phone]  Speech Language Pathology Treatment  Patient Details  Name: Craig Hart MRN: [id number] Date of Birth: 02-22-1956 Referring Provider (SLP): Dr. Alger Simons   Encounter Date: 04/27/2021   End of Session - 04/27/21 1224     Visit Number 12    Number of Visits 25    Date for SLP Re-Evaluation 06/08/21    Authorization Type WC - 18    Authorization - Visit Number 12    Authorization - Number of Visits 18    SLP Start Time 0930    SLP Stop Time  5056    SLP Time Calculation (min) 43 min             Past Medical History:  Diagnosis Date   Hypertension    Hypertensive emergency 09/21/2014    Past Surgical History:  Procedure Laterality Date   LEFT HEART CATHETERIZATION WITH CORONARY ANGIOGRAM N/A 09/21/2014   Procedure: LEFT HEART CATHETERIZATION WITH CORONARY ANGIOGRAM;  Surgeon: Sherren Mocha, MD;  Location: [facility] CATH LAB;  Service: Cardiovascular:  minimal LAD disease.  20-30% mid RCA disease with a right dominant system.  EF 65-70%. --False activation of ST elevation MI.   TONSILLECTOMY      There were no vitals filed for this visit.   Subjective Assessment - 04/27/21 1022     Subjective "I have to check him"    Patient is accompained by: Family member    Currently in Pain? Yes    Pain Score 8     Pain Location Arm    Pain Orientation Left    Pain Descriptors / Indicators Aching                   ADULT SLP TREATMENT - 04/27/21 0944       General Information   Behavior/Cognition Alert;Cooperative;Pleasant mood      Treatment Provided   Treatment provided Cognitive-Linquistic      Cognitive-Linquistic Treatment   Treatment focused on Cognition;Patient/family/caregiver education    Skilled Treatment Craig Hart returns with [facility] complete, ID'd errors in ST with occasional min A He filled in 4  gifts on Christmas list as well. Craig Hart is present. She reports Craig Hart is completing household chores/activities 1-3 a day. He is recalling meds with pill organizer. they both report that Craig Hart continues to manage Craig Hart's appiontments due to time confusion. She has 3 differnt calendars on the fridge for Craig Hart to look at. Today, we generated strategy of keeping all of his appointments on 1 calendar due to difficulty processing 3 calendars. Provided blank calendars - Craig Hart filled in dates with verbal cues to process day of the 1st of Nov and Dec. He required cues to ID 3/4 spelling errors when writing in holidays and initial cue to use speech to text internet search to check and correct spelling. Next session, we may have to color code times if needed for ease of visual processing      Assessment / Recommendations / [place] with current plan of care      Progression Toward Goals   Progression toward goals Progressing toward goals              SLP Education - 04/27/21 1221     Education Details add to calendars, do HW    Person(s) Educated Patient;Spouse    Methods Explanation;Demonstration;Handout;Verbal cues    Comprehension  Verbalized understanding;Returned demonstration;Verbal cues required;Need further instruction              SLP Short Term Goals - 04/27/21 1223       SLP SHORT TERM GOAL #1   Title Will complete CLQT and modify goals as needed    Time 2    Period Weeks    Status Achieved      SLP SHORT TERM GOAL #2   Title Pt will keep calendar with appointments and daily schedule to complete 1 household task a day and 1 cognitive task a day for 10-15 minutes.    Time 1    Period Weeks    Status Achieved      SLP SHORT TERM GOAL #3   Title Pt  will carryover 2 strategies for processing and recalling information from healthcare providers with occasional min A over 2 sessions    Time 1    Period Weeks    Status Partially Met      SLP SHORT TERM GOAL  #4   Title Craig Hart will attend to simple cognitive linguistic task for 15 minutes with 3 or less redirections over 3 sessions    Baseline 04-18-21, 04-22-21; 04-25-21    Time 2    Period Weeks    Status Achieved      SLP SHORT TERM GOAL #5   Title Pt will use pill organizer, alarm if needed or other compensation to manage his medication with no misses/mistakes over 1 week    Time 1    Period Weeks    Status Achieved              SLP Long Term Goals - 04/27/21 1223       SLP LONG TERM GOAL #1   Title Pt will manage his schedule and time to be ready for appointtments and completed 2 chores a day and 2 cognitive activities for 15 minutes    Time 7    Period Weeks    Status On-going      SLP LONG TERM GOAL #2   Title Pt/family will carryover 3 compensations for pt to participate in  4 turns in a conversation or 4 instructions without need for repeptition    Time 7    Period Weeks    Status On-going      SLP LONG TERM GOAL #3   Title Pt will plan and carrout a visit with 1 friend/extended family member on 3 occasions for 20 minute visits with rare min A from sposue    Time 7    Period Weeks    Status On-going      SLP LONG TERM GOAL #4   Title Pt will alternate attention on cognitive linguistic task for 20 minutes with 3 or less redirections    Time 7    Period Weeks    Status On-going      SLP LONG TERM GOAL #5   Title [name] will improve score on Neuro-QOL Cognitive Function short form by 3 points    Baseline Craig Hart - 16; Craig Hart - 13    Time 7    Period Weeks    Status On-going              Plan - 04/27/21 1221     Clinical Impression Statement Craig Hart continues to bring his binder and use this to complete ST HW. He verbalized safety awareness with asking his son to drive him long distances. Error awareness conitnues to  required mod A. Craig Hart carried over strategies of using phone to check and correct spelling of family names. Mild to mod cognitive  lingusitic impairments persist. Confusion re: appointment times persists. Initiated calendar for [facility] to manage times. Continue skilled ST to maximize cognition for safety    Speech Therapy Frequency 2x / week    Duration 12 weeks    Treatment/Interventions Compensatory strategies;Patient/family education;Functional tasks;Cueing hierarchy;Multimodal communcation approach;Cognitive reorganization;Environmental controls;SLP instruction and feedback;Internal/external aids;Compensatory techniques;Language facilitation    Potential to Achieve Goals Good    Potential Considerations Severity of impairments             Patient will benefit from skilled therapeutic intervention in order to improve the following deficits and impairments:   Cognitive communication deficit    Problem List Patient Active Problem List   Diagnosis Date Noted   Dizziness and giddiness 02/03/2021   Cervical spondylosis without myelopathy 05/06/2020   Myalgia 04/22/2020   Sleep disturbance 04/05/2020   Neck pain 04/05/2020   Post concussive syndrome 03/22/2020   Nausea without vomiting 03/22/2020   Chest pain 08/19/2017   Hypertensive urgency 08/19/2017   Chest pain with moderate risk for cardiac etiology    ST elevation    Hypokalemia 09/23/2014   Abnormal EKG 09/23/2014   Hyperlipidemia 09/23/2014   Accelerated hypertension 09/21/2014    Craig Hart, Annye Rusk MS, CCC-SLP 04/27/2021, 12:25 PM  [facility] [address] [place], [place], [address] Phone: [phone]   Fax:  [phone]   Name: Craig Hart MRN: [id number] Date of Birth: [birth year]

## 2021-05-02 ENCOUNTER — Ambulatory Visit: Payer: No Typology Code available for payment source | Admitting: Speech Pathology

## 2021-05-02 ENCOUNTER — Encounter: Payer: Self-pay | Admitting: Speech Pathology

## 2021-05-02 ENCOUNTER — Other Ambulatory Visit: Payer: Self-pay

## 2021-05-02 DIAGNOSIS — R41841 Cognitive communication deficit: Secondary | ICD-10-CM

## 2021-05-02 NOTE — Therapy (Signed)
Craig Hart [address] [place], [place], [address] Phone: [phone]   Fax:  [phone]  Speech Language Pathology Treatment  Patient Details  Name: Craig Hart MRN: [id number] Date of Birth: 02-05-1956 Referring Provider (SLP): Dr. Alger Hart   Encounter Date: 05/02/2021   End of Session - 05/02/21 1214     Visit Number 13    Number of Visits 25    Date for SLP Re-Evaluation 06/08/21    Authorization Type WC - 18    Authorization - Visit Number 13    Authorization - Number of Visits 18    SLP Start Time 6294    SLP Stop Time  1146    SLP Time Calculation (min) 44 min             Past Medical History:  Diagnosis Date   Hypertension    Hypertensive emergency 09/21/2014    Past Surgical History:  Procedure Laterality Date   LEFT HEART CATHETERIZATION WITH CORONARY ANGIOGRAM N/A 09/21/2014   Procedure: LEFT HEART CATHETERIZATION WITH CORONARY ANGIOGRAM;  Surgeon: Sherren Mocha, MD;  Location: [facility] CATH LAB;  Service: Cardiovascular:  minimal LAD disease.  20-30% mid RCA disease with a right dominant system.  EF 65-70%. --False activation of ST elevation MI.   TONSILLECTOMY      There were no vitals filed for this visit.   Subjective Assessment - 05/02/21 1111     Subjective "He fixed holes in the wall"    Currently in Pain? Yes    Pain Score 8     Pain Location Hand    Pain Orientation Left    Pain Descriptors / Indicators Aching    Pain Type Chronic pain                   ADULT SLP TREATMENT - 05/02/21 1113       General Information   Behavior/Cognition Alert;Cooperative;Pleasant mood      Treatment Provided   Treatment provided Cognitive-Linquistic      Cognitive-Linquistic Treatment   Treatment focused on Cognition;Patient/family/caregiver education    Skilled Treatment Craig Hart arrives with December calendar filled out, however days not crossed off. He crossed them off  today with rare min A to ID date.Targeted alternating attention, error awareness and working memory [name] math using chart to convert measurements. Craig Hart required extended time and verbal cues to process sailient details in 6/8 questions. He required cues to ID reading errors 5x (he read tablespoon/teaspoon 3x and paint/pint 3x) Mental math correct 7/8 questions. Cognition and error awareness also targeted with time math reading 2 bank schedules attending to details such as lobby vs drive thru and week day vs week end. Again, Craig Hart required cues for error awareness,  reading pm/am and branch/bank 3x each. Extended time and usual min verba and visual cues to process2-4 salient details in each question. Mental time math accurate 8/8 questions. Craig Hart provided with instructions to use pencil      Assessment / Recommendations / Plan   Plan Continue with current plan of care      Progression Toward Goals   Progression toward goals Progressing toward goals                SLP Short Term Goals - 05/02/21 1214       SLP SHORT TERM GOAL #1   Title Will complete CLQT and modify goals as needed    Time 2    Period Weeks  Status Achieved      SLP SHORT TERM GOAL #2   Title Pt will keep calendar with appointments and daily schedule to complete 1 household task a day and 1 cognitive task a day for 10-15 minutes.    Time 1    Period Weeks    Status Achieved      SLP SHORT TERM GOAL #3   Title Pt  will carryover 2 strategies for processing and recalling information from healthcare providers with occasional min A over 2 sessions    Time 1    Period Weeks    Status Partially Met      SLP SHORT TERM GOAL #4   Title [name] will attend to simple cognitive linguistic task for 15 minutes with 3 or less redirections over 3 sessions    Baseline 04-18-21, 04-22-21; 04-25-21    Time 2    Period Weeks    Status Achieved      SLP SHORT TERM GOAL #5   Title Pt will use pill organizer, alarm if  needed or other compensation to manage his medication with no misses/mistakes over 1 week    Time 1    Period Weeks    Status Achieved              SLP Long Term Goals - 05/02/21 1214       SLP LONG TERM GOAL #1   Title Pt will manage his schedule and time to be ready for appointtments and completed 2 chores a day and 2 cognitive activities for 15 minutes    Time 7    Period Weeks    Status On-going      SLP LONG TERM GOAL #2   Title Pt/family will carryover 3 compensations for pt to participate in  4 turns in a conversation or 4 instructions without need for repeptition    Time 7    Period Weeks    Status On-going      SLP LONG TERM GOAL #3   Title Pt will plan and carrout a visit with 1 friend/extended family member on 3 occasions for 20 minute visits with rare min A from sposue    Time 7    Period Weeks    Status On-going      SLP LONG TERM GOAL #4   Title Pt will alternate attention on cognitive linguistic task for 20 minutes with 3 or less redirections    Time 7    Period Weeks    Status On-going      SLP LONG TERM GOAL #5   Title [name] will improve score on Neuro-QOL Cognitive Function short form by 3 points    Baseline Craig Hart - 16; Craig Hart - 13    Time 7    Period Weeks    Status On-going              Plan - 05/02/21 1208     Clinical Impression Statement Improving 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 Error awareness in reading, writing and speaking continues to require frequent mod A. Craig Hart successfully completed his December calendar and used calendar to access time of his next 2 appointments. Craig Hart continues to have success completing house hold chores. At this time cognitive impairments affect ability to complete IADL's such as Doctor, hospital. Continue skilled ST to maximize cognition for safety, QOL and to reduce caregiver burden    Speech Therapy Frequency 2x / week    Duration 12 weeks  Treatment/Interventions  Compensatory strategies;Patient/family education;Functional tasks;Cueing hierarchy;Multimodal communcation approach;Cognitive reorganization;Environmental controls;SLP instruction and feedback;Internal/external aids;Compensatory techniques;Language facilitation    Potential to Achieve Goals Good             Patient will benefit from skilled therapeutic intervention in order to improve the following deficits and impairments:   Cognitive communication deficit    Problem List Patient Active Problem List   Diagnosis Date Noted   Dizziness and giddiness 02/03/2021   Cervical spondylosis without myelopathy 05/06/2020   Myalgia 04/22/2020   Sleep disturbance 04/05/2020   Neck pain 04/05/2020   Post concussive syndrome 03/22/2020   Nausea without vomiting 03/22/2020   Chest pain 08/19/2017   Hypertensive urgency 08/19/2017   Chest pain with moderate risk for cardiac etiology    ST elevation    Hypokalemia 09/23/2014   Abnormal EKG 09/23/2014   Hyperlipidemia 09/23/2014   Accelerated hypertension 09/21/2014    Anirudh Baiz, Annye Rusk MS, CCC-SLP 05/02/2021, 12:16 PM  [facility] [address] [place], [place], [address] Phone: [phone]   Fax:  [phone]   Name: Craig Hart MRN: [id number] Date of Birth: 1955-10-26

## 2021-05-04 ENCOUNTER — Ambulatory Visit: Payer: No Typology Code available for payment source | Admitting: Speech Pathology

## 2021-05-04 ENCOUNTER — Other Ambulatory Visit: Payer: Self-pay

## 2021-05-04 ENCOUNTER — Encounter: Payer: Self-pay | Admitting: Speech Pathology

## 2021-05-04 DIAGNOSIS — R41841 Cognitive communication deficit: Secondary | ICD-10-CM

## 2021-05-04 NOTE — Therapy (Signed)
[facility] [address] [place], [place], [address] Phone: [phone]   Fax:  [phone]  Speech Language Pathology Treatment  Patient Details  Name: Craig Hart MRN: [id number] Date of Birth: 06-27-1955 Referring Provider (SLP): Dr. Alger Simons   Encounter Date: 05/04/2021   End of Session - 05/04/21 1205     Visit Number 14    Number of Visits 25    Date for SLP Re-Evaluation 06/08/21    Authorization Type WC - 18    Authorization - Visit Number 14    Authorization - Number of Visits 18    SLP Start Time 1100    SLP Stop Time  2426    SLP Time Calculation (min) 47 min    Activity Tolerance Patient tolerated treatment well             Past Medical History:  Diagnosis Date   Hypertension    Hypertensive emergency 09/21/2014    Past Surgical History:  Procedure Laterality Date   LEFT HEART CATHETERIZATION WITH CORONARY ANGIOGRAM N/A 09/21/2014   Procedure: LEFT HEART CATHETERIZATION WITH CORONARY ANGIOGRAM;  Surgeon: Sherren Mocha, MD;  Location: [facility] CATH LAB;  Service: Cardiovascular:  minimal LAD disease.  20-30% mid RCA disease with a right dominant system.  EF 65-70%. --False activation of ST elevation MI.   TONSILLECTOMY      There were no vitals filed for this visit.   Subjective Assessment - 05/04/21 1116     Subjective "I get so frustrated  - he goes too fast"    Patient is accompained by: Family member   [name]   Currently in Pain? Yes    Pain Score 8     Pain Location Hand    Pain Orientation Left    Pain Descriptors / Indicators Aching                   ADULT SLP TREATMENT - 05/04/21 1117       General Information   Behavior/Cognition Alert;Cooperative;Pleasant mood      Treatment Provided   Treatment provided Cognitive-Linquistic      Cognitive-Linquistic Treatment   Treatment focused on Cognition;Patient/family/caregiver education    Skilled Treatment [place]  returned [facility], correct with A from spouse. Targeted word finding and error awareness using Pharmacist, hospital ([facility]) generating 3 subjects and objects for    verbs (measure, deliver, weigh) with occasional mod questioning cues. He genrated 7/9 independentlywith extended time and required verbal cues for 2/9. Craig Hart answered 3 Wh questions to generate complex sentence with 1 subject object from each verb, with extended time for 4/9 and cues for word finding 3x. Written errors ID'd 4/8, cues required to ID other 4/8. Craig Hart used phone to double check his spelling or when he was unsure how to spell a word. This is improvement with reduced impulsivity in written tasks in speech room. I suspect he remains impulsive with reduced error awarness at home. [name] and [place] report word finding difficulties persist, and Marissa has reduced awareness of when he says the wrong word at home.or when reading.      Assessment / Recommendations / Plan   Plan Continue with current plan of care      Progression Toward Goals   Progression toward goals Progressing toward goals                SLP Short Term Goals - 05/04/21 1204       SLP SHORT  TERM GOAL #1   Title Will complete CLQT and modify goals as needed    Time 2    Period Weeks    Status Achieved      SLP SHORT TERM GOAL #2   Title Pt will keep calendar with appointments and daily schedule to complete 1 household task a day and 1 cognitive task a day for 10-15 minutes.    Time 1    Period Weeks    Status Achieved      SLP SHORT TERM GOAL #3   Title Pt  will carryover 2 strategies for processing and recalling information from healthcare providers with occasional min A over 2 sessions    Time 1    Period Weeks    Status Partially Met      SLP SHORT TERM GOAL #4   Title Craig Hart will attend to simple cognitive linguistic task for 15 minutes with 3 or less redirections over 3 sessions    Baseline 04-18-21, 04-22-21; 04-25-21    Time 2     Period Weeks    Status Achieved      SLP SHORT TERM GOAL #5   Title Pt will use pill organizer, alarm if needed or other compensation to manage his medication with no misses/mistakes over 1 week    Time 1    Period Weeks    Status Achieved              SLP Long Term Goals - 05/04/21 1204       SLP LONG TERM GOAL #1   Title Pt will manage his schedule and time to be ready for appointtments and completed 2 chores a day and 2 cognitive activities for 15 minutes    Time 6    Period Weeks    Status On-going      SLP LONG TERM GOAL #2   Title Pt/family will carryover 3 compensations for pt to participate in  4 turns in a conversation or 4 instructions without need for repeptition    Time 6    Period Weeks    Status On-going      SLP LONG TERM GOAL #3   Title Pt will plan and carrout a visit with 1 friend/extended family member on 3 occasions for 20 minute visits with rare min A from sposue    Time 6    Period Weeks    Status On-going      SLP LONG TERM GOAL #4   Title Pt will alternate attention on cognitive linguistic task for 20 minutes with 3 or less redirections    Time 6    Period Weeks    Status On-going      SLP LONG TERM GOAL #5   Title [name] will improve score on Neuro-QOL Cognitive Function short form by 3 points    Baseline Craig Hart - 16; Craig Hart - 13    Time 6    Period Weeks    Status On-going              Plan - 05/04/21 1201     Clinical Impression Statement Improving 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 Error awareness in reading, writing and speaking continues to require frequent mod A. [place] and [name] continue to report word finding difficulties, where Craig Hart is not aware he has said the wrong word. Written expression continues to have significant difficuclty spelling simple words. Craig Hart is ID'ing written errors in ST session with improved accuracy, but requires his  phone to self correct the errors.l  Craig Hart  continues to have success completing house hold chores. At this time cognitive impairments affect ability to complete IADL's such as Doctor, hospital. Continue skilled ST to maximize cognition for safety, QOL and to reduce caregiver burden. Will request 6 more ST visits from [facility]    Speech Therapy Frequency 2x / week    Duration 12 weeks    Treatment/Interventions Compensatory strategies;Patient/family education;Functional tasks;Cueing hierarchy;Multimodal communcation approach;Cognitive reorganization;Environmental controls;SLP instruction and feedback;Internal/external aids;Compensatory techniques;Language facilitation    Potential to Achieve Goals Good    Potential Considerations Severity of impairments             Patient will benefit from skilled therapeutic intervention in order to improve the following deficits and impairments:   Cognitive communication deficit    Problem List Patient Active Problem List   Diagnosis Date Noted   Dizziness and giddiness 02/03/2021   Cervical spondylosis without myelopathy 05/06/2020   Myalgia 04/22/2020   Sleep disturbance 04/05/2020   Neck pain 04/05/2020   Post concussive syndrome 03/22/2020   Nausea without vomiting 03/22/2020   Chest pain 08/19/2017   Hypertensive urgency 08/19/2017   Chest pain with moderate risk for cardiac etiology    ST elevation    Hypokalemia 09/23/2014   Abnormal EKG 09/23/2014   Hyperlipidemia 09/23/2014   Accelerated hypertension 09/21/2014    Shabria Egley, Annye Rusk MS, CCC-SLP 05/04/2021, 12:06 PM  [facility] [address] [place], [place], [address] Phone: [phone]   Fax:  [phone]   Name: Craig Hart MRN: [id number] Date of Birth: March 26, 1956

## 2021-05-09 ENCOUNTER — Ambulatory Visit: Payer: No Typology Code available for payment source | Admitting: Speech Pathology

## 2021-05-09 ENCOUNTER — Other Ambulatory Visit: Payer: Self-pay

## 2021-05-09 ENCOUNTER — Encounter: Payer: Self-pay | Admitting: Speech Pathology

## 2021-05-09 DIAGNOSIS — R41841 Cognitive communication deficit: Secondary | ICD-10-CM | POA: Diagnosis not present

## 2021-05-09 NOTE — Therapy (Signed)
[facility] [address] [place], [place], [address] Phone: [phone]   Fax:  [phone]  Speech Language Pathology Treatment  Patient Details  Name: Craig Hart MRN: [id number] Date of Birth: 07-10-1955 Referring Provider (SLP): Dr. Alger Simons   Encounter Date: 05/09/2021   End of Session - 05/09/21 1156     Visit Number 15    Number of Visits 25    Date for SLP Re-Evaluation 06/08/21    Authorization Type [id number] (was 16, 8 more approved week of 05/02/21)    Authorization - Visit Number 15    Authorization - Number of Visits 26    SLP Start Time 1100    SLP Stop Time  6269    SLP Time Calculation (min) 45 min             Past Medical History:  Diagnosis Date   Hypertension    Hypertensive emergency 09/21/2014    Past Surgical History:  Procedure Laterality Date   LEFT HEART CATHETERIZATION WITH CORONARY ANGIOGRAM N/A 09/21/2014   Procedure: LEFT HEART CATHETERIZATION WITH CORONARY ANGIOGRAM;  Surgeon: Sherren Mocha, MD;  Location: [facility] CATH LAB;  Service: Cardiovascular:  minimal LAD disease.  20-30% mid RCA disease with a right dominant system.  EF 65-70%. --False activation of ST elevation MI.   TONSILLECTOMY      There were no vitals filed for this visit.   Subjective Assessment - 05/09/21 1115     Subjective "She has [place] necks to Hart"    Patient is accompained by: Family member    Currently in Pain? Yes    Pain Score 8     Pain Location Hand    Pain Orientation Left    Pain Descriptors / Indicators Aching    Pain Type Chronic pain    Pain Onset More than a month ago    Pain Frequency Constant                   ADULT SLP TREATMENT - 05/09/21 1116       General Information   Behavior/Cognition Alert;Cooperative;Pleasant mood      Treatment Provided   Treatment provided Cognitive-Linquistic      Cognitive-Linquistic Treatment   Treatment focused on  Cognition;Patient/family/caregiver education    Skilled Treatment Craig Hart returned [facility], completed incorrectly, using 3 different words per page, rather than 3 different subject objects for 1 verb. Targeted word finding, spelling, and error awareness. Shed generated 3 subjects and objects for the verbs massage, fasten, and wipe. Craig generated 2 for each with rare min A, and the 3rd with frequent mod A or questioning cues. Occasional min ID 20 written errors, however Craig required consistent writtne cues or internet search to correct his spelling. Errors copying words also required cues to ID errors 50% of the time. Craig Hart benefits from verbal cues to slow down. Oral spelling of word was not beneficial as a strategy as Craig rerquired frequent mod A for this as well. Craig Hart independently used calendar in his binder to ID next ST appointment on Wednesday      Assessment / Recommendations / [place] with current plan of care      Progression Toward Goals   Progression toward goals Progressing toward goals              SLP Education - 05/09/21 1152     Education Details correct completion of VNeST, error awareness    Person(s)  Educated Patient;Spouse    Methods Explanation;Demonstration;Verbal cues    Comprehension Returned demonstration;Need further instruction              SLP Short Term Goals - 05/09/21 1154       SLP SHORT TERM GOAL #1   Title Will complete CLQT and modify goals as needed    Time 2    Period Weeks    Status Achieved      SLP SHORT TERM GOAL #2   Title Pt will keep calendar with appointments and daily schedule to complete 1 household task a day and 1 cognitive task a day for 10-15 minutes.    Time 1    Period Weeks    Status Achieved      SLP SHORT TERM GOAL #3   Title Pt  will carryover 2 strategies for processing and recalling information from healthcare providers with occasional min A over 2 sessions    Time 1    Period Weeks    Status  Partially Met      SLP SHORT TERM GOAL #4   Title Craig Hart will attend to simple cognitive linguistic task for 15 minutes with 3 or less redirections over 3 sessions    Baseline 04-18-21, 04-22-21; 04-25-21    Time 2    Period Weeks    Status Achieved      SLP SHORT TERM GOAL #5   Title Pt will use pill organizer, alarm if needed or other compensation to manage his medication with no misses/mistakes over 1 week    Time 1    Period Weeks    Status Achieved              SLP Long Term Goals - 05/09/21 1155       SLP LONG TERM GOAL #1   Title Pt will manage his schedule and time to be ready for appointtments and completed 2 Hart a day and 2 cognitive activities for 15 minutes    Baseline 05/09/21    Time 5    Period Weeks    Status On-going      SLP LONG TERM GOAL #2   Title Pt/family will carryover 3 compensations for pt to participate in  4 turns in a conversation or 4 instructions without need for repeptition    Time 5    Period Weeks    Status On-going      SLP LONG TERM GOAL #3   Title Pt will plan and carrout a visit with 1 friend/extended family member on 3 occasions for 20 minute visits with rare min A from sposue    Time 5    Period Weeks    Status On-going      SLP LONG TERM GOAL #4   Title Pt will alternate attention on cognitive linguistic task for 20 minutes with 3 or less redirections    Time 5    Period Weeks    Status On-going      SLP LONG TERM GOAL #5   Title [name] will improve score on Neuro-QOL Cognitive Function short form by 3 points    Baseline Craig Hart - 16; Craig Hart - 13    Time 5    Period Weeks    Status On-going              Plan - 05/09/21 1153     Clinical Impression Statement Improving 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 Error awareness in reading, writing and speaking continues  to require frequent mod A. Craig Hart and Craig Hart continue to report word finding difficulties, where Craig Hart is not aware Craig has  said the wrong word. Written expression continues to have significant difficuclty spelling simple words. Craig Hart is ID'ing written errors in ST session with improved accuracy, but requires his phone to self correct the errors.l  Craig Hart. At this time cognitive impairments affect ability to complete IADL's such as Doctor, hospital. Continue skilled ST to maximize cognition for safety, QOL and to reduce caregiver burden. 8 more visits approvede by [facility]    Speech Therapy Frequency 2x / week    Duration 12 weeks    Treatment/Interventions Compensatory strategies;Patient/family education;Functional tasks;Cueing hierarchy;Multimodal communcation approach;Cognitive reorganization;Environmental controls;SLP instruction and feedback;Internal/external aids;Compensatory techniques;Language facilitation    Potential to Achieve Goals Good    Potential Considerations Severity of impairments             Patient will benefit from skilled therapeutic intervention in order to improve the following deficits and impairments:   Cognitive communication deficit    Problem List Patient Active Problem List   Diagnosis Date Noted   Dizziness and giddiness 02/03/2021   Cervical spondylosis without myelopathy 05/06/2020   Myalgia 04/22/2020   Sleep disturbance 04/05/2020   Neck pain 04/05/2020   Post concussive syndrome 03/22/2020   Nausea without vomiting 03/22/2020   Chest pain 08/19/2017   Hypertensive urgency 08/19/2017   Chest pain with moderate risk for cardiac etiology    ST elevation    Hypokalemia 09/23/2014   Abnormal EKG 09/23/2014   Hyperlipidemia 09/23/2014   Accelerated hypertension 09/21/2014    Rashena Dowling, Annye Rusk, Ramer 05/09/2021, 11:58 AM  [facility] [address] [place], [place], [address] Phone: [phone]   Fax:  [phone]   Name: DIANTE BARLEY MRN:  [id number] Date of Birth: 03/10/1956

## 2021-05-11 ENCOUNTER — Ambulatory Visit: Payer: No Typology Code available for payment source

## 2021-05-11 ENCOUNTER — Other Ambulatory Visit: Payer: Self-pay

## 2021-05-11 DIAGNOSIS — R41841 Cognitive communication deficit: Secondary | ICD-10-CM | POA: Diagnosis not present

## 2021-05-11 NOTE — Therapy (Signed)
[facility] [address] [place], [place], [address] Phone: [phone]   Fax:  [phone]  Speech Language Pathology Treatment  Patient Details  Name: Craig Hart MRN: [id number] Date of Birth: 1956-01-25 Referring Provider (SLP): Dr. Alger Simons   Encounter Date: 05/11/2021   End of Session - 05/11/21 1101     Visit Number 16    Number of Visits 25    Date for SLP Re-Evaluation 06/08/21    Authorization Type [id number] (was 16, 8 more approved week of 05/02/21)    Authorization - Visit Number 16    Authorization - Number of Visits 26    SLP Start Time 1100    SLP Stop Time  1140    SLP Time Calculation (min) 40 min    Activity Tolerance Patient tolerated treatment well             Past Medical History:  Diagnosis Date   Hypertension    Hypertensive emergency 09/21/2014    Past Surgical History:  Procedure Laterality Date   LEFT HEART CATHETERIZATION WITH CORONARY ANGIOGRAM N/A 09/21/2014   Procedure: LEFT HEART CATHETERIZATION WITH CORONARY ANGIOGRAM;  Surgeon: Sherren Mocha, MD;  Location: [facility] CATH LAB;  Service: Cardiovascular:  minimal LAD disease.  20-30% mid RCA disease with a right dominant system.  EF 65-70%. --False activation of ST elevation MI.   TONSILLECTOMY      There were no vitals filed for this visit.   Subjective Assessment - 05/11/21 1102     Subjective "my shoulder kept me up all night long"    Currently in Pain? Yes    Pain Score 8     Pain Location Shoulder                   ADULT SLP TREATMENT - 05/11/21 1101       General Information   Behavior/Cognition Alert;Cooperative;Pleasant mood      Treatment Provided   Treatment provided Cognitive-Linquistic      Cognitive-Linquistic Treatment   Treatment focused on Cognition;Patient/family/caregiver education    Skilled Treatment "We did some word verb things" when asked to recap recent ST sessions. Pt indicated  task was challenging and needed extra time. Errors noted while reading VNeST sentences, including substituions and omissions. SLP trialed word level reading, in which pt was 74% accurate with 8 self-corrections given extra time. SLP inquired about reading at home, in which pt reads mechanic books ~1x/day. Pt endorses he often has to reread to "make it make sense" but pt has general idea of concepts. Pt reportedly managing his own medications, in which pt recalled taking 5 medications in morning and once at day. Pt lines them up on the table. Pt recalled two medication names and 6 types with extra time. Pt exhibited difficulty naming 6 types of medications for second time, in which SLP cued patient write down to aid thought organization. Pt indepedently used phone to aid spelling. Pt needed cue to identify written error (information versus inflammation). SLP recommended pt received assistance if medications adjust or change due to reduced awareness. Pt stated "If I don't take it in the morning, I might forget." SLP suggested using routine to aid recall, for example taking medications after breakfast meal every day.      Assessment / Recommendations / Plan   Plan Continue with current plan of care      Progression Toward Goals   Progression toward goals Progressing toward goals  SLP Short Term Goals - 05/11/21 1102       SLP SHORT TERM GOAL #1   Title Will complete CLQT and modify goals as needed    Time 2    Period Weeks    Status Achieved      SLP SHORT TERM GOAL #2   Title Pt will keep calendar with appointments and daily schedule to complete 1 household task a day and 1 cognitive task a day for 10-15 minutes.    Time 1    Period Weeks    Status Achieved      SLP SHORT TERM GOAL #3   Title Pt  will carryover 2 strategies for processing and recalling information from healthcare providers with occasional min A over 2 sessions    Time 1    Period Weeks    Status  Partially Met      SLP SHORT TERM GOAL #4   Title Rutilio will attend to simple cognitive linguistic task for 15 minutes with 3 or less redirections over 3 sessions    Baseline 04-18-21, 04-22-21; 04-25-21    Time 2    Period Weeks    Status Achieved      SLP SHORT TERM GOAL #5   Title Pt will use pill organizer, alarm if needed or other compensation to manage his medication with no misses/mistakes over 1 week    Time 1    Period Weeks    Status Achieved              SLP Long Term Goals - 05/11/21 1102       SLP LONG TERM GOAL #1   Title Pt will manage his schedule and time to be ready for appointtments and completed 2 chores a day and 2 cognitive activities for 15 minutes    Baseline 05/09/21    Time 5    Period Weeks    Status On-going      SLP LONG TERM GOAL #2   Title Pt/family will carryover 3 compensations for pt to participate in  4 turns in a conversation or 4 instructions without need for repeptition    Time 5    Period Weeks    Status On-going      SLP LONG TERM GOAL #3   Title Pt will plan and carrout a visit with 1 friend/extended family member on 3 occasions for 20 minute visits with rare min A from sposue    Time 5    Period Weeks    Status On-going      SLP LONG TERM GOAL #4   Title Pt will alternate attention on cognitive linguistic task for 20 minutes with 3 or less redirections    Time 5    Period Weeks    Status On-going      SLP LONG TERM GOAL #5   Title [name] will improve score on Neuro-QOL Cognitive Function short form by 3 points    Baseline Isaak - 16; [name] - 13    Time 5    Period Weeks    Status On-going              Plan - 05/11/21 1102     Clinical Impression Statement Improving 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 Error awareness in reading, writing and speaking continues to require frequent mod A. Cung and [name] continue to report word finding difficulties, where Deovion is not aware he has  said the wrong word. Written expression continues  to have significant difficulty spelling simple words. Hubbert is ID'ing written errors in ST session with improved accuracy, but requires his phone to self correct the errors. Westly continues to have success completing house hold chores. At this time cognitive impairments affect ability to complete IADL's such as Doctor, hospital. Continue skilled ST to maximize cognition for safety, QOL and to reduce caregiver burden. 8 more visits approvede by [facility]    Speech Therapy Frequency 2x / week    Duration 12 weeks    Treatment/Interventions Compensatory strategies;Patient/family education;Functional tasks;Cueing hierarchy;Multimodal communcation approach;Cognitive reorganization;Environmental controls;SLP instruction and feedback;Internal/external aids;Compensatory techniques;Language facilitation    Potential to Achieve Goals Good    Potential Considerations Severity of impairments             Patient will benefit from skilled therapeutic intervention in order to improve the following deficits and impairments:   Cognitive communication deficit    Problem List Patient Active Problem List   Diagnosis Date Noted   Dizziness and giddiness 02/03/2021   Cervical spondylosis without myelopathy 05/06/2020   Myalgia 04/22/2020   Sleep disturbance 04/05/2020   Neck pain 04/05/2020   Post concussive syndrome 03/22/2020   Nausea without vomiting 03/22/2020   Chest pain 08/19/2017   Hypertensive urgency 08/19/2017   Chest pain with moderate risk for cardiac etiology    ST elevation    Hypokalemia 09/23/2014   Abnormal EKG 09/23/2014   Hyperlipidemia 09/23/2014   Accelerated hypertension 09/21/2014    Alinda Deem, CCC-SLP 05/11/2021, 1:24 PM  [facility] [address] [place], [place], [address] Phone: [phone]   Fax:  [phone]   Name: GATSBY CHISMAR MRN:  [id number] Date of Birth: [birth year]

## 2021-05-18 ENCOUNTER — Encounter
Payer: No Typology Code available for payment source | Attending: Physical Medicine & Rehabilitation | Admitting: Physical Medicine & Rehabilitation

## 2021-05-18 ENCOUNTER — Encounter: Payer: Self-pay | Admitting: Physical Medicine & Rehabilitation

## 2021-05-18 ENCOUNTER — Other Ambulatory Visit: Payer: Self-pay

## 2021-05-18 DIAGNOSIS — R42 Dizziness and giddiness: Secondary | ICD-10-CM | POA: Diagnosis present

## 2021-05-18 DIAGNOSIS — G479 Sleep disorder, unspecified: Secondary | ICD-10-CM | POA: Insufficient documentation

## 2021-05-18 DIAGNOSIS — F0781 Postconcussional syndrome: Secondary | ICD-10-CM | POA: Diagnosis not present
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
## 2021-05-18 NOTE — Patient Instructions (Addendum)
SLEEP IS THE FOUNDATION FOR YOUR RECOVERY   ELAVIL AT 8:30 TO 9PM EACH NIGHT WITHOUT FAIL!!  I WANT YOU TO GET AT LEAST 7-8 HOURS OF SLEEP PER NIGHT   AVOID CAT NAPS DURING THE DAY.    IF YOU FEEL THAT YOU MUST HAVE A NAP DURING THE DAY, SET YOUR ALARM FOR 30 MINUTES.   CALLE ME IN  3-4 WEEKS TO GIVE ME AN UPDATE ON SLEEP AND MOOD.

## 2021-05-20 ENCOUNTER — Ambulatory Visit: Payer: No Typology Code available for payment source | Attending: Family Medicine

## 2021-05-20 ENCOUNTER — Other Ambulatory Visit: Payer: Self-pay

## 2021-05-20 DIAGNOSIS — R41841 Cognitive communication deficit: Secondary | ICD-10-CM | POA: Diagnosis present

## 2021-05-20 NOTE — Therapy (Signed)
[facility] [address] [place], [place], [address] Phone: [phone]   Fax:  [phone]  Speech Language Pathology Treatment  Patient Details  Name: Craig Hart MRN: [id number] Date of Birth: 1955/09/08 Referring Provider (SLP): Dr. Alger Hart   Encounter Date: 05/20/2021   End of Session - 05/20/21 1231     Visit Number 17    Number of Visits 25    Date for SLP Re-Evaluation 06/08/21    Authorization Type [id number] (was 16, 8 more approved week of 05/02/21)    Authorization - Visit Number 17    Authorization - Number of Visits 26    SLP Start Time 1245    SLP Stop Time  8099    SLP Time Calculation (min) 44 min    Activity Tolerance Patient tolerated treatment well             Past Medical History:  Diagnosis Date   Hypertension    Hypertensive emergency 09/21/2014    Past Surgical History:  Procedure Laterality Date   LEFT HEART CATHETERIZATION WITH CORONARY ANGIOGRAM N/A 09/21/2014   Procedure: LEFT HEART CATHETERIZATION WITH CORONARY ANGIOGRAM;  Surgeon: Sherren Mocha, MD;  Location: [facility] CATH LAB;  Service: Cardiovascular:  minimal LAD disease.  20-30% mid RCA disease with a right dominant system.  EF 65-70%. --False activation of ST elevation MI.   TONSILLECTOMY      There were no vitals filed for this visit.   Subjective Assessment - 05/20/21 1231     Subjective "nothing"    Patient is accompained by: Family member    Currently in Pain? Yes    Pain Score 8     Pain Location Shoulder                   ADULT SLP TREATMENT - 05/20/21 1231       General Information   Behavior/Cognition Alert;Cooperative;Pleasant mood      Treatment Provided   Treatment provided Cognitive-Linquistic      Cognitive-Linquistic Treatment   Treatment focused on Cognition;Patient/family/caregiver education    Skilled Treatment Pt's wife prompted him that he completed task to fill out blank  calendar with all appointments. Rare min A required to identify specific information. Pt continues to rely on prompting from wife throughout day as he forgets appointment specifics. SLP educated and demonstrated how to use phone as external aid, including use of calendar, alarms, and reminders. Pt able to insert upcoming appointments into phone calendar with occasional fading to rare A. Pt noted to independently double check intermittently and pt was increasingly aware of errors with min verbal prompting. His wife continues to indicated pt exhibits reduced initiation.      Assessment / Recommendations / Plan   Plan Continue with current plan of care      Progression Toward Goals   Progression toward goals Progressing toward goals              SLP Education - 05/20/21 1531     Education Details external aids (phone)    Person(s) Educated Spouse;Patient    Methods Explanation;Demonstration;Verbal cues    Comprehension Verbalized understanding;Returned demonstration;Need further instruction              SLP Short Term Goals - 05/20/21 1534       SLP SHORT TERM GOAL #1   Title Will complete CLQT and modify goals as needed    Status Achieved      SLP SHORT  TERM GOAL #2   Title Pt will keep calendar with appointments and daily schedule to complete 1 household task a day and 1 cognitive task a day for 10-15 minutes.    Status Achieved      SLP SHORT TERM GOAL #3   Title Pt  will carryover 2 strategies for processing and recalling information from healthcare providers with occasional min A over 2 sessions    Status Partially Met      SLP SHORT TERM GOAL #4   Title Craig Hart will attend to simple cognitive linguistic task for 15 minutes with 3 or less redirections over 3 sessions    Baseline 04-18-21, 04-22-21; 04-25-21    Status Achieved      SLP SHORT TERM GOAL #5   Title Pt will use pill organizer, alarm if needed or other compensation to manage his medication with no  misses/mistakes over 1 week    Status Achieved              SLP Long Term Goals - 05/20/21 1534       SLP LONG TERM GOAL #1   Title Pt will manage his schedule and time to be ready for appointtments and completed 2 chores a day and 2 cognitive activities for 15 minutes    Baseline 05/09/21    Time 4    Period Weeks    Status On-going      SLP LONG TERM GOAL #2   Title Pt/family will carryover 3 compensations for pt to participate in  4 turns in a conversation or 4 instructions without need for repeptition    Time 4    Period Weeks    Status On-going      SLP LONG TERM GOAL #3   Title Pt will plan and carrout a visit with 1 friend/extended family member on 3 occasions for 20 minute visits with rare min A from sposue    Time 4    Period Weeks    Status On-going      SLP LONG TERM GOAL #4   Title Pt will alternate attention on cognitive linguistic task for 20 minutes with 3 or less redirections    Time 4    Period Weeks    Status On-going      SLP LONG TERM GOAL #5   Title [name] will improve score on Neuro-QOL Cognitive Function short form by 3 points    Baseline Craig Hart - 16; Craig Hart - 13    Time 4    Period Weeks    Status On-going              Plan - 05/20/21 1531     Clinical Impression Statement Improving 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 Error awareness in reading, writing and speaking continues to require intermittent to frequent mod A. SLP educated and demonstrated how patient can use phone as external aid and way to manage his tasks with less reliance on his wife. Craig Hart continues to have success completing house hold chores but some reduced initiation reported by his wife. At this time cognitive impairments affect ability to complete IADL's such as Doctor, hospital. Continue skilled ST to maximize cognition for safety, QOL and to reduce caregiver burden. 8 more visits approved by [facility]    Speech Therapy Frequency 2x / week     Duration 12 weeks    Treatment/Interventions Compensatory strategies;Patient/family education;Functional tasks;Cueing hierarchy;Multimodal communcation approach;Cognitive reorganization;Environmental controls;SLP instruction and feedback;Internal/external aids;Compensatory techniques;Language facilitation  Potential to Achieve Goals Good    Potential Considerations Severity of impairments             Patient will benefit from skilled therapeutic intervention in order to improve the following deficits and impairments:   Cognitive communication deficit    Problem List Patient Active Problem List   Diagnosis Date Noted   Dizziness and giddiness 02/03/2021   Cervical spondylosis without myelopathy 05/06/2020   Myalgia 04/22/2020   Sleep disturbance 04/05/2020   Neck pain 04/05/2020   Post concussive syndrome 03/22/2020   Nausea without vomiting 03/22/2020   Chest pain 08/19/2017   Hypertensive urgency 08/19/2017   Chest pain with moderate risk for cardiac etiology    ST elevation    Hypokalemia 09/23/2014   Abnormal EKG 09/23/2014   Hyperlipidemia 09/23/2014   Accelerated hypertension 09/21/2014    Alinda Deem, CCC-SLP 05/20/2021, 3:35 PM  [facility] [address] [place], [place], [address] Phone: [phone]   Fax:  [phone]   Name: Craig Hart MRN: [id number] Date of Birth: 10-13-1955

## 2021-05-24 ENCOUNTER — Ambulatory Visit: Payer: No Typology Code available for payment source

## 2021-05-24 ENCOUNTER — Other Ambulatory Visit: Payer: Self-pay

## 2021-05-24 ENCOUNTER — Encounter: Payer: BC Managed Care – PPO | Admitting: Speech Pathology

## 2021-05-24 DIAGNOSIS — R41841 Cognitive communication deficit: Secondary | ICD-10-CM | POA: Diagnosis not present

## 2021-05-24 NOTE — Therapy (Signed)
[facility] [address] [place], [place], [address] Phone: [phone]   Fax:  [phone]  Speech Language Pathology Treatment  Patient Details  Name: Craig Hart MRN: [id number] Date of Birth: 04-26-1956 Referring Provider (SLP): Dr. Alger Simons   Encounter Date: 05/24/2021   End of Session - 05/24/21 0846     Visit Number 18    Number of Visits 25    Date for SLP Re-Evaluation 06/08/21    Authorization Type WC-26 (was 16, 8 more approved week of 05/02/21)    Authorization - Visit Number 18    Authorization - Number of Visits 26    SLP Start Time 0846    SLP Stop Time  0930    SLP Time Calculation (min) 44 min    Activity Tolerance Patient tolerated treatment well             Past Medical History:  Diagnosis Date   Hypertension    Hypertensive emergency 09/21/2014    Past Surgical History:  Procedure Laterality Date   LEFT HEART CATHETERIZATION WITH CORONARY ANGIOGRAM N/A 09/21/2014   Procedure: LEFT HEART CATHETERIZATION WITH CORONARY ANGIOGRAM;  Surgeon: Sherren Mocha, MD;  Location: [facility] CATH LAB;  Service: Cardiovascular:  minimal LAD disease.  20-30% mid RCA disease with a right dominant system.  EF 65-70%. --False activation of ST elevation MI.   TONSILLECTOMY      There were no vitals filed for this visit.   Subjective Assessment - 05/24/21 0846     Subjective "Craig Hart slept well"    Patient is accompained by: Family member    Currently in Pain? Yes    Pain Score 7     Pain Location Shoulder                   ADULT SLP TREATMENT - 05/24/21 0845       General Information   Behavior/Cognition Alert;Cooperative;Pleasant mood      Treatment Provided   Treatment provided Cognitive-Linquistic      Cognitive-Linquistic Treatment   Treatment focused on Cognition;Patient/family/caregiver education    Skilled Treatment Per wife, Craig Hart has new parameters for nightly medication  with MD recommending strictly taking between hours of 8:30-9. SLP generated strategy for patient to set alarm to cue himself to take the medication. Pt able to set up alarm with min A. SLP targeted attention to detail and problem solving task, in which pt identified 3/4 errors independently. Extra processing time and rare min A required to correct errors.      Assessment / Recommendations / Plan   Plan Continue with current plan of care      Progression Toward Goals   Progression toward goals Progressing toward goals              SLP Education - 05/24/21 1403     Education Details external aid for medication management    Person(s) Educated Patient;Spouse    Methods Explanation;Demonstration;Verbal cues    Comprehension Verbalized understanding;Returned demonstration;Need further instruction              SLP Short Term Goals - 05/20/21 1534       SLP SHORT TERM GOAL #1   Title Will complete CLQT and modify goals as needed    Status Achieved      SLP SHORT TERM GOAL #2   Title Pt will keep calendar with appointments and daily schedule to complete 1 household task a day and 1 cognitive task  a day for 10-15 minutes.    Status Achieved      SLP SHORT TERM GOAL #3   Title Pt  will carryover 2 strategies for processing and recalling information from healthcare providers with occasional min A over 2 sessions    Status Partially Met      SLP SHORT TERM GOAL #4   Title Craig Hart will attend to simple cognitive linguistic task for 15 minutes with 3 or less redirections over 3 sessions    Baseline 04-18-21, 04-22-21; 04-25-21    Status Achieved      SLP SHORT TERM GOAL #5   Title Pt will use pill organizer, alarm if needed or other compensation to manage his medication with no misses/mistakes over 1 week    Status Achieved              SLP Long Term Goals - 05/24/21 0854       SLP LONG TERM GOAL #1   Title Pt will manage his schedule and time to be ready for appointtments  and completed 2 chores a day and 2 cognitive activities for 15 minutes    Baseline 05/09/21    Time 3    Period Weeks    Status On-going      SLP LONG TERM GOAL #2   Title Pt/family will carryover 3 compensations for pt to participate in  4 turns in a conversation or 4 instructions without need for repeptition    Time 3    Period Weeks    Status On-going      SLP LONG TERM GOAL #3   Title Pt will plan and carrout a visit with 1 friend/extended family member on 3 occasions for 20 minute visits with rare min A from sposue    Time 3    Period Weeks    Status On-going      SLP LONG TERM GOAL #4   Title Pt will alternate attention on cognitive linguistic task for 20 minutes with 3 or less redirections    Time 3    Period Weeks    Status On-going      SLP LONG TERM GOAL #5   Title [name] will improve score on Neuro-QOL Cognitive Function short form by 3 points    Baseline Craig Hart - 16; Craig Hart - 13    Time 3    Period Weeks    Status On-going              Plan - 05/24/21 0211     Clinical Impression Statement Improving 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 Error awareness on structured tasks required rare min A today. SLP educated and demonstrated how patient can use phone as external aid for medication management and way to manage his tasks with less reliance on his wife. Craig Hart continues to have success completing house hold chores but some reduced initiation reported by his wife. At this time cognitive impairments affect ability to complete IADL's such as Doctor, hospital. Continue skilled ST to maximize cognition for safety, QOL and to reduce caregiver burden. 8 more visits approved by [facility]    Speech Therapy Frequency 2x / week    Duration 12 weeks    Treatment/Interventions Compensatory strategies;Patient/family education;Functional tasks;Cueing hierarchy;Multimodal communcation approach;Cognitive reorganization;Environmental controls;SLP  instruction and feedback;Internal/external aids;Compensatory techniques;Language facilitation    Potential to Achieve Goals Good    Potential Considerations Severity of impairments             Patient will benefit  from skilled therapeutic intervention in order to improve the following deficits and impairments:   Cognitive communication deficit    Problem List Patient Active Problem List   Diagnosis Date Noted   Dizziness and giddiness 02/03/2021   Cervical spondylosis without myelopathy 05/06/2020   Myalgia 04/22/2020   Sleep disturbance 04/05/2020   Neck pain 04/05/2020   Post concussive syndrome 03/22/2020   Nausea without vomiting 03/22/2020   Chest pain 08/19/2017   Hypertensive urgency 08/19/2017   Chest pain with moderate risk for cardiac etiology    ST elevation    Hypokalemia 09/23/2014   Abnormal EKG 09/23/2014   Hyperlipidemia 09/23/2014   Accelerated hypertension 09/21/2014    Alinda Deem, CCC-SLP 05/24/2021, 2:05 PM  [facility] [address] [place], [place], [address] Phone: [phone]   Fax:  [phone]   Name: Craig Hart MRN: [id number] Date of Birth: 1955-12-09

## 2021-05-26 ENCOUNTER — Other Ambulatory Visit: Payer: Self-pay

## 2021-05-26 ENCOUNTER — Ambulatory Visit: Payer: No Typology Code available for payment source

## 2021-05-26 DIAGNOSIS — R41841 Cognitive communication deficit: Secondary | ICD-10-CM

## 2021-05-26 NOTE — Therapy (Signed)
[facility] [address] [place], [place], [address] Phone: [phone]   Fax:  [phone]  Speech Language Pathology Treatment  Patient Details  Name: Craig Hart MRN: [id number] Date of Birth: 1955-08-19 Referring Provider (SLP): Dr. Alger Simons   Encounter Date: 05/26/2021   End of Session - 05/26/21 1311     Visit Number 19    Number of Visits 25    Date for SLP Re-Evaluation 06/08/21    Authorization Type WC-26 (was 16, 8 more approved week of 05/02/21)    Authorization - Visit Number 19    Authorization - Number of Visits 26    SLP Start Time 3845    SLP Stop Time  3646    SLP Time Calculation (min) 50 min    Activity Tolerance Patient tolerated treatment well             Past Medical History:  Diagnosis Date   Hypertension    Hypertensive emergency 09/21/2014    Past Surgical History:  Procedure Laterality Date   LEFT HEART CATHETERIZATION WITH CORONARY ANGIOGRAM N/A 09/21/2014   Procedure: LEFT HEART CATHETERIZATION WITH CORONARY ANGIOGRAM;  Surgeon: Sherren Mocha, MD;  Location: [facility] CATH LAB;  Service: Cardiovascular:  minimal LAD disease.  20-30% mid RCA disease with a right dominant system.  EF 65-70%. --False activation of ST elevation MI.   TONSILLECTOMY      There were no vitals filed for this visit.   Subjective Assessment - 05/26/21 1316     Subjective "taking it easy"    Patient is accompained by: Family member    Currently in Pain? Yes    Pain Score 7     Pain Location Shoulder                   ADULT SLP TREATMENT - 05/26/21 1311       General Information   Behavior/Cognition Alert;Cooperative;Pleasant mood      Treatment Provided   Treatment provided Cognitive-Linquistic      Cognitive-Linquistic Treatment   Treatment focused on Cognition;Patient/family/caregiver education    Skilled Treatment Pt reports success using phone as external aid for management  of appointments and nightly medications. Pt added more appointments to phone calendar, in which SLP cued patient to double check. Discrepancies noted x2, which pt able to identify independently. Pt stated he did forget bank password the next day when rushed to check it, but pt able to compensate by using written aid. Pt's wife reported concern for increased procrastination of simple tasks (ex: laminating handicap tag). SLP educated and instructed use of reminders app in phone to aid recall and completion of targeted tasks. Pt able to utilize reminder app with occasional min A. SLP will expect targeted task to be completed for next session. SLP also provided pen/paper HEP per wife request.      Assessment / Recommendations / Plan   Plan Continue with current plan of care      Progression Toward Goals   Progression toward goals Progressing toward goals              SLP Education - 05/26/21 1414     Education Details reminders app    Person(s) Educated Patient;Spouse    Methods Explanation;Demonstration;Handout    Comprehension Verbalized understanding;Returned demonstration;Need further instruction              SLP Short Term Goals - 05/20/21 1534       SLP SHORT TERM  GOAL #1   Title Will complete CLQT and modify goals as needed    Status Achieved      SLP SHORT TERM GOAL #2   Title Pt will keep calendar with appointments and daily schedule to complete 1 household task a day and 1 cognitive task a day for 10-15 minutes.    Status Achieved      SLP SHORT TERM GOAL #3   Title Pt  will carryover 2 strategies for processing and recalling information from healthcare providers with occasional min A over 2 sessions    Status Partially Met      SLP SHORT TERM GOAL #4   Title Craig Hart will attend to simple cognitive linguistic task for 15 minutes with 3 or less redirections over 3 sessions    Baseline 04-18-21, 04-22-21; 04-25-21    Status Achieved      SLP SHORT TERM GOAL #5   Title  Pt will use pill organizer, alarm if needed or other compensation to manage his medication with no misses/mistakes over 1 week    Status Achieved              SLP Long Term Goals - 05/26/21 1312       SLP LONG TERM GOAL #1   Title Pt will manage his schedule and time to be ready for appointtments and completed 2 chores a day and 2 cognitive activities for 15 minutes    Baseline 05/09/21    Time 3    Period Weeks    Status On-going      SLP LONG TERM GOAL #2   Title Pt/family will carryover 3 compensations for pt to participate in  4 turns in a conversation or 4 instructions without need for repeptition    Time 3    Period Weeks    Status On-going      SLP LONG TERM GOAL #3   Title Pt will plan and carrout a visit with 1 friend/extended family member on 3 occasions for 20 minute visits with rare min A from sposue    Time 3    Period Weeks    Status On-going      SLP LONG TERM GOAL #4   Title Pt will alternate attention on cognitive linguistic task for 20 minutes with 3 or less redirections    Time 3    Period Weeks    Status On-going      SLP LONG TERM GOAL #5   Title [name] will improve score on Neuro-QOL Cognitive Function short form by 3 points    Baseline Craig Hart - 16; [name] - 13    Time 3    Period Weeks    Status On-going              Plan - 05/26/21 1312     Clinical Impression Statement Improving 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 Error awareness on structured tasks required rare min A today. SLP educated and demonstrated how patient can use phone as external aid for task management and way to increase his independence and decrease his reliance on his wife. Craig Hart continues to have success completing house hold chores.  At this time cognitive impairments affect ability to complete IADL's such as Doctor, hospital. Continue skilled ST to maximize cognition for safety, QOL and to reduce caregiver burden. 8 more visits  approved by [facility]    Speech Therapy Frequency 2x / week    Duration 12 weeks    Treatment/Interventions Compensatory  strategies;Patient/family education;Functional tasks;Cueing hierarchy;Multimodal communcation approach;Cognitive reorganization;Environmental controls;SLP instruction and feedback;Internal/external aids;Compensatory techniques;Language facilitation    Potential to Achieve Goals Good    Potential Considerations Severity of impairments             Patient will benefit from skilled therapeutic intervention in order to improve the following deficits and impairments:   Cognitive communication deficit    Problem List Patient Active Problem List   Diagnosis Date Noted   Dizziness and giddiness 02/03/2021   Cervical spondylosis without myelopathy 05/06/2020   Myalgia 04/22/2020   Sleep disturbance 04/05/2020   Neck pain 04/05/2020   Post concussive syndrome 03/22/2020   Nausea without vomiting 03/22/2020   Chest pain 08/19/2017   Hypertensive urgency 08/19/2017   Chest pain with moderate risk for cardiac etiology    ST elevation    Hypokalemia 09/23/2014   Abnormal EKG 09/23/2014   Hyperlipidemia 09/23/2014   Accelerated hypertension 09/21/2014    Alinda Deem, CCC-SLP 05/26/2021, 2:16 PM  [facility] [address] [place], [place], [address] Phone: [phone]   Fax:  [phone]   Name: ESSEX PERRY MRN: [id number] Date of Birth: 01-03-1956

## 2021-05-30 ENCOUNTER — Other Ambulatory Visit: Payer: Self-pay

## 2021-05-30 ENCOUNTER — Ambulatory Visit: Payer: No Typology Code available for payment source | Admitting: Speech Pathology

## 2021-05-30 ENCOUNTER — Encounter: Payer: Self-pay | Admitting: Speech Pathology

## 2021-05-30 DIAGNOSIS — R41841 Cognitive communication deficit: Secondary | ICD-10-CM

## 2021-05-30 NOTE — Patient Instructions (Addendum)
    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  He has difficulty processing and comprehending speech   He may not be aware that he is not getting everything you say  Please talk slower, and ask him to repeat what he has heard to make sure he understands  All information needs to be provided in writing so Napolean has a chance to process it at his own speed and to help him remember the information   Reduce background noise (radio, TV,)  Don't discuss important information with Dionne while you are doing something else, like taking his blood pressure or blood  Be face to face and slow down  Remember, a brain injury is invisible - [place] looks good on the outside but has difficulty remembering and focusing

## 2021-05-30 NOTE — Therapy (Signed)
[facility] [address] [place], [place], [address] Phone: [phone]   Fax:  [phone]  Speech Language Pathology Treatment  Patient Details  Name: Craig Hart MRN: [id number] Date of Birth: [birth year] Referring Provider (SLP): Dr. Alger Simons   Encounter Date: 05/30/2021   End of Session - 05/30/21 1200     Visit Number 20    Number of Visits 25    Date for SLP Re-Evaluation 06/08/21    Authorization Type [id number] (was 16, 8 more approved week of 05/02/21)    Authorization - Visit Number 20    Authorization - Number of Visits 24    SLP Start Time 7510    SLP Stop Time  0930    SLP Time Calculation (min) 43 min             Past Medical History:  Diagnosis Date   Hypertension    Hypertensive emergency 09/21/2014    Past Surgical History:  Procedure Laterality Date   LEFT HEART CATHETERIZATION WITH CORONARY ANGIOGRAM N/A 09/21/2014   Procedure: LEFT HEART CATHETERIZATION WITH CORONARY ANGIOGRAM;  Surgeon: Sherren Mocha, MD;  Location: [facility] CATH LAB;  Service: Cardiovascular:  minimal LAD disease.  20-30% mid RCA disease with a right dominant system.  EF 65-70%. --False activation of ST elevation MI.   TONSILLECTOMY      There were no vitals filed for this visit.   Subjective Assessment - 05/30/21 0850     Subjective "We have new baby"    Patient is accompained by: Family member    Currently in Pain? Yes    Pain Score 7     Pain Location Shoulder    Pain Orientation Left    Pain Descriptors / Indicators Aching    Pain Type Chronic pain                   ADULT SLP TREATMENT - 05/30/21 0851       General Information   Behavior/Cognition Alert;Cooperative;Pleasant mood      Treatment Provided   Treatment provided Cognitive-Linquistic      Cognitive-Linquistic Treatment   Treatment focused on Cognition;Patient/family/caregiver education    Skilled Treatment Craig Hart got confused  with Dr. Serita Grit recommendation, and was not taking his night time meds. He argued with Craig Hart about meds, not aware that he was wrong. We generated strategy of keeping the after visit summaries in a section in his notebook so he can refer to them to aid recall. Also generated a  handout to help Craig Hart self advocate that he does need reduced background noise, slow talking and information in writing, that he can hand to a provider to explain his issues.      Assessment / Recommendations / Plan   Plan Continue with current plan of care      Progression Toward Goals   Progression toward goals Progressing toward goals              SLP Education - 05/30/21 0928     Education Details self advocate and use handout, put after visit summary in binder    Person(s) Educated Patient;Spouse    Methods Explanation;Demonstration;Verbal cues    Comprehension Verbalized understanding              SLP Short Term Goals - 05/30/21 1159       SLP SHORT TERM GOAL #1   Title Will complete CLQT and modify goals as needed    Status Achieved  SLP SHORT TERM GOAL #2   Title Pt will keep calendar with appointments and daily schedule to complete 1 household task a day and 1 cognitive task a day for 10-15 minutes.    Status Achieved      SLP SHORT TERM GOAL #3   Title Pt  will carryover 2 strategies for processing and recalling information from healthcare providers with occasional min A over 2 sessions    Status Partially Met      SLP SHORT TERM GOAL #4   Title Craig Hart will attend to simple cognitive linguistic task for 15 minutes with 3 or less redirections over 3 sessions    Baseline 04-18-21, 04-22-21; 04-25-21    Status Achieved      SLP SHORT TERM GOAL #5   Title Pt will use pill organizer, alarm if needed or other compensation to manage his medication with no misses/mistakes over 1 week    Status Achieved              SLP Long Term Goals - 05/30/21 1159       SLP LONG TERM GOAL  #1   Title Pt will manage his schedule and time to be ready for appointtments and completed 2 chores a day and 2 cognitive activities for 15 minutes    Baseline 05/09/21, 05/30/21    Time 2    Period Weeks    Status Achieved      SLP LONG TERM GOAL #2   Title Pt/family will carryover 3 compensations for pt to participate in  4 turns in a conversation or 4 instructions without need for repeptition    Time 3    Period Weeks    Status Achieved      SLP LONG TERM GOAL #3   Title Pt will plan and carrout a visit with 1 friend/extended family member on 3 occasions for 20 minute visits with rare min A from sposue    Time 2    Period Weeks    Status On-going      SLP LONG TERM GOAL #4   Title Pt will alternate attention on cognitive linguistic task for 20 minutes with 3 or less redirections    Time 2    Period Weeks    Status On-going      SLP LONG TERM GOAL #5   Title [name] will improve score on Neuro-QOL Cognitive Function short form by 3 points    Baseline Craig Hart - 16; Craig Hart - 13    Time 3    Period Weeks    Status On-going              Plan - 05/30/21 0932     Clinical Impression Statement 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 Modified memory binder to include after visit summaries and provided a sheet expalining Ha's deficits and how providers can help him process, attend to and recall information. Error awareness improving, however he continue to require usual min A. Impulisivity is reduicng, which is also helping error awareness. All medical, legal, insurance information must go through [facility] and be provided in writing so Karina can process this at his own speed. Continue skilled ST to maximize cognition for safety, QOL and reduce caregiver burden/frustration    Speech Therapy Frequency 2x / week    Duration 12 weeks    Treatment/Interventions Compensatory strategies;Patient/family education;Functional tasks;Cueing hierarchy;Multimodal  communcation approach;Cognitive reorganization;Environmental controls;SLP instruction and feedback;Internal/external aids;Compensatory techniques;Language facilitation    Potential to Achieve Goals Good  Patient will benefit from skilled therapeutic intervention in order to improve the following deficits and impairments:   Cognitive communication deficit    Problem List Patient Active Problem List   Diagnosis Date Noted   Dizziness and giddiness 02/03/2021   Cervical spondylosis without myelopathy 05/06/2020   Myalgia 04/22/2020   Sleep disturbance 04/05/2020   Neck pain 04/05/2020   Post concussive syndrome 03/22/2020   Nausea without vomiting 03/22/2020   Chest pain 08/19/2017   Hypertensive urgency 08/19/2017   Chest pain with moderate risk for cardiac etiology    ST elevation    Hypokalemia 09/23/2014   Abnormal EKG 09/23/2014   Hyperlipidemia 09/23/2014   Accelerated hypertension 09/21/2014    Zina Pitzer, Annye Rusk, [place] 05/30/2021, 12:02 PM  [facility] [address] [place], [place], [address] Phone: [phone]   Fax:  [phone]   Name: LARREN COPES MRN: [id number] Date of Birth: 07-23-1955

## 2021-06-01 ENCOUNTER — Other Ambulatory Visit: Payer: Self-pay

## 2021-06-01 ENCOUNTER — Encounter: Payer: Self-pay | Admitting: Speech Pathology

## 2021-06-01 ENCOUNTER — Ambulatory Visit: Payer: No Typology Code available for payment source | Admitting: Speech Pathology

## 2021-06-01 DIAGNOSIS — R41841 Cognitive communication deficit: Secondary | ICD-10-CM | POA: Diagnosis not present

## 2021-06-01 NOTE — Therapy (Signed)
[facility] [address] [place], [place], [address] Phone: [phone]   Fax:  [phone]  Speech Language Pathology Treatment  Patient Details  Name: Craig Hart MRN: [id number] Date of Birth: [birth year] Referring Provider (SLP): Dr. Alger Simons   Encounter Date: 06/01/2021   End of Session - 06/01/21 1619     Visit Number 21    Number of Visits 25    Date for SLP Re-Evaluation 06/08/21    Authorization Type WC-26 (was 16, 8 more approved week of 05/02/21)    Authorization - Visit Number 21    SLP Start Time [id number]   pt arrived late   SLP Stop Time  1015    SLP Time Calculation (min) 39 min    Activity Tolerance Patient tolerated treatment well             Past Medical History:  Diagnosis Date   Hypertension    Hypertensive emergency 09/21/2014    Past Surgical History:  Procedure Laterality Date   LEFT HEART CATHETERIZATION WITH CORONARY ANGIOGRAM N/A 09/21/2014   Procedure: LEFT HEART CATHETERIZATION WITH CORONARY Cyril Loosen;  Surgeon: Sherren Mocha, MD;  Location: [facility] CATH LAB;  Service: Cardiovascular:  minimal LAD disease.  20-30% mid RCA disease with a right dominant system.  EF 65-70%. --False activation of ST elevation MI.   TONSILLECTOMY      There were no vitals filed for this visit.   Subjective Assessment - 06/01/21 0948     Subjective "I did the  calendar"    Patient is accompained by: Family member   [name]   Currently in Pain? Yes    Pain Score 7     Pain Location Shoulder    Pain Orientation Left    Pain Descriptors / Indicators Aching                   ADULT SLP TREATMENT - 06/01/21 0949       General Information   Behavior/Cognition Alert;Cooperative;Pleasant mood      Treatment Provided   Treatment provided Cognitive-Linquistic      Cognitive-Linquistic Treatment   Treatment focused on Cognition;Patient/family/caregiver education    Skilled Treatment  Revan returns with calendars filled out for Feb and March - we added appointments to calendar for March - Tamas required cues for error awareness of spelling and neeed to copy MD names. Marico continues to keep up with his emials and texts - anad [name] reports his phone is often not charged. We generated strategy of placing his phone on his night stand and charge it at night. Provided a bright paper with note on it to charge it. Instructed pt to start working on clearing his texts and emails 10 minuts a day, add to to do list. Instructed Jaken to bring his phone in the house as [name] reports family has frustration due to not being able to reach him.Attention to detail and error awareness and impulsivity awareness, Ashford ID'd 3 errors (spelling) with rare min A and double checked ansswer and took his time to complete task 100% correct with rare min A      Assessment / Recommendations / [place] with current plan of care      Progression Toward Goals   Progression toward goals Progressing toward goals              SLP Education - 06/01/21 1025     Education Details use external reminder to charge  phone and bring it inside    Person(s) Educated Patient;Spouse    Methods Explanation;Demonstration;Handout    Comprehension Verbalized understanding;Verbal cues required              SLP Short Term Goals - 06/01/21 1619       SLP SHORT TERM GOAL #1   Title Will complete CLQT and modify goals as needed    Status Achieved      SLP SHORT TERM GOAL #2   Title Pt will keep calendar with appointments and daily schedule to complete 1 household task a day and 1 cognitive task a day for 10-15 minutes.    Status Achieved      SLP SHORT TERM GOAL #3   Title Pt  will carryover 2 strategies for processing and recalling information from healthcare providers with occasional min A over 2 sessions    Status Partially Met      SLP SHORT TERM GOAL #4   Title Jerime will attend to  simple cognitive linguistic task for 15 minutes with 3 or less redirections over 3 sessions    Baseline 04-18-21, 04-22-21; 04-25-21    Status Achieved      SLP SHORT TERM GOAL #5   Title Pt will use pill organizer, alarm if needed or other compensation to manage his medication with no misses/mistakes over 1 week    Status Achieved              SLP Long Term Goals - 06/01/21 1619       SLP LONG TERM GOAL #1   Title Pt will manage his schedule and time to be ready for appointtments and completed 2 chores a day and 2 cognitive activities for 15 minutes    Baseline 05/09/21, 05/30/21    Time 2    Period Weeks    Status Achieved      SLP LONG TERM GOAL #2   Title Pt/family will carryover 3 compensations for pt to participate in  4 turns in a conversation or 4 instructions without need for repeptition    Time 3    Period Weeks    Status Achieved      SLP LONG TERM GOAL #3   Title Pt will plan and carrout a visit with 1 friend/extended family member on 3 occasions for 20 minute visits with rare min A from sposue    Time 2    Period Weeks    Status On-going      SLP LONG TERM GOAL #4   Title Pt will alternate attention on cognitive linguistic task for 20 minutes with 3 or less redirections    Time 2    Period Weeks    Status On-going      SLP LONG TERM GOAL #5   Title [name] will improve score on Neuro-QOL Cognitive Function short form by 3 points    Baseline Jimel - 16; [name] - 13    Time 3    Period Weeks    Status On-going              Plan - 06/01/21 1619     Clinical Impression Statement 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 Modified memory binder to include after visit summaries and provided a sheet expalining Zyree's deficits and how providers can help him process, attend to and recall information. Error awareness improving, however he continue to require usual min A. Impulisivity is reduicng, which is also helping error awareness.  All medical,  legal, insurance information must go through [facility] and be provided in writing so Perkins can process this at his own speed. Continue skilled ST to maximize cognition for safety, QOL and reduce caregiver burden/frustration    Speech Therapy Frequency 2x / week    Duration 12 weeks    Treatment/Interventions Compensatory strategies;Patient/family education;Functional tasks;Cueing hierarchy;Multimodal communcation approach;Cognitive reorganization;Environmental controls;SLP instruction and feedback;Internal/external aids;Compensatory techniques;Language facilitation    Potential to Achieve Goals Good    Potential Considerations Severity of impairments             Patient will benefit from skilled therapeutic intervention in order to improve the following deficits and impairments:   Cognitive communication deficit    Problem List Patient Active Problem List   Diagnosis Date Noted   Dizziness and giddiness 02/03/2021   Cervical spondylosis without myelopathy 05/06/2020   Myalgia 04/22/2020   Sleep disturbance 04/05/2020   Neck pain 04/05/2020   Post concussive syndrome 03/22/2020   Nausea without vomiting 03/22/2020   Chest pain 08/19/2017   Hypertensive urgency 08/19/2017   Chest pain with moderate risk for cardiac etiology    ST elevation    Hypokalemia 09/23/2014   Abnormal EKG 09/23/2014   Hyperlipidemia 09/23/2014   Accelerated hypertension 09/21/2014    Brylin Stopper, Annye Rusk, [place] 06/01/2021, 4:20 PM  [facility] [address] [place], [place], [address] Phone: [phone]   Fax:  [phone]   Name: LAQUINN SHIPPY MRN: [id number] Date of Birth: 10/05/1955

## 2021-06-06 ENCOUNTER — Other Ambulatory Visit: Payer: Self-pay

## 2021-06-06 ENCOUNTER — Ambulatory Visit: Payer: No Typology Code available for payment source

## 2021-06-06 DIAGNOSIS — R41841 Cognitive communication deficit: Secondary | ICD-10-CM

## 2021-06-06 NOTE — Patient Instructions (Addendum)
Only spend 5-10 minutes cleaning out your text messages/emails. Do NOT delete messages that seem important. You are trying to filter through to delete the old or unnecessary texts   OR delete 10-20 messages at a time. Chip away a little bit at a time.    Repeat your appointment time aloud a few times to help aid your attention and memory.    Write down all your grandchildren's names

## 2021-06-06 NOTE — Therapy (Signed)
[facility] [address] [place], [place], [address] Phone: [phone]   Fax:  [phone]  Speech Language Pathology Treatment  Patient Details  Name: Craig Hart MRN: [id number] Date of Birth: May 29, 1956 Referring Provider (SLP): Dr. Alger Simons   Encounter Date: 06/06/2021   End of Session - 06/06/21 0909     Visit Number 22    Number of Visits 25    Date for SLP Re-Evaluation 06/08/21    Authorization Type WC-26 (was 16, 8 more approved week of 05/02/21)    Authorization - Visit Number 22    Authorization - Number of Visits 26    SLP Start Time 0846    SLP Stop Time  0926    SLP Time Calculation (min) 40 min    Activity Tolerance Patient tolerated treatment well             Past Medical History:  Diagnosis Date   Hypertension    Hypertensive emergency 09/21/2014    Past Surgical History:  Procedure Laterality Date   LEFT HEART CATHETERIZATION WITH CORONARY ANGIOGRAM N/A 09/21/2014   Procedure: LEFT HEART CATHETERIZATION WITH CORONARY Cyril Loosen;  Surgeon: Sherren Mocha, MD;  Location: [facility] CATH LAB;  Service: Cardiovascular:  minimal LAD disease.  20-30% mid RCA disease with a right dominant system.  EF 65-70%. --False activation of ST elevation MI.   TONSILLECTOMY      There were no vitals filed for this visit.   Subjective Assessment - 06/06/21 0846     Subjective "I didn't sleep good"    Currently in Pain? Yes    Pain Score 8     Pain Location Shoulder                   ADULT SLP TREATMENT - 06/06/21 0846       General Information   Behavior/Cognition Alert;Cooperative;Pleasant mood      Treatment Provided   Treatment provided Cognitive-Linquistic      Cognitive-Linquistic Treatment   Treatment focused on Cognition;Patient/family/caregiver education    Skilled Treatment Pt felt tired this morning as he did not take nightly medication last night (stopped versus snoozed  reminder) as pt was on the phone. SLP targeted functional problem solving, in which SLP provided mod prompting to identify better solution for that particular situation. Pt did not recall information from last session, in which SLP cued pt to look in binder. Pt believed top sheet was old, in which SLP cued patient to locate date which was last session. Pt read through information aloud with occasional self-corrections. Pt did work on deleting text messages, in which pt endorsed he deleted some texts that he shouldn't have. SLP reiterated rationale for task and again suggested time limit to aid focus. SLP cued patient to begin working on emails, in which SLP demonstrated how to delete messages. Pt only able to delete 10 emails before becoming fatigued. SLP provided reminder this is okay and this could be possible goal each time he uses phone to delete 10 emails at a time. SLP provided written handout to aid recall.      Assessment / Recommendations / Plan   Plan Continue with current plan of care      Progression Toward Goals   Progression toward goals Progressing toward goals              SLP Education - 06/06/21 0909     Education Details recs    Person(s) Educated Patient  Methods Explanation;Demonstration;Verbal cues;Handout    Comprehension Verbalized understanding;Returned demonstration;Need further instruction              SLP Short Term Goals - 06/01/21 1619       SLP SHORT TERM GOAL #1   Title Will complete CLQT and modify goals as needed    Status Achieved      SLP SHORT TERM GOAL #2   Title Pt will keep calendar with appointments and daily schedule to complete 1 household task a day and 1 cognitive task a day for 10-15 minutes.    Status Achieved      SLP SHORT TERM GOAL #3   Title Pt  will carryover 2 strategies for processing and recalling information from healthcare providers with occasional min A over 2 sessions    Status Partially Met      SLP SHORT TERM  GOAL #4   Title Harel will attend to simple cognitive linguistic task for 15 minutes with 3 or less redirections over 3 sessions    Baseline 04-18-21, 04-22-21; 04-25-21    Status Achieved      SLP SHORT TERM GOAL #5   Title Pt will use pill organizer, alarm if needed or other compensation to manage his medication with no misses/mistakes over 1 week    Status Achieved              SLP Long Term Goals - 06/06/21 0911       SLP LONG TERM GOAL #1   Title Pt will manage his schedule and time to be ready for appointtments and completed 2 chores a day and 2 cognitive activities for 15 minutes    Baseline 05/09/21, 05/30/21    Status Achieved      SLP LONG TERM GOAL #2   Title Pt/family will carryover 3 compensations for pt to participate in  4 turns in a conversation or 4 instructions without need for repeptition    Status Achieved      SLP LONG TERM GOAL #3   Title Pt will plan and carrout a visit with 1 friend/extended family member on 3 occasions for 20 minute visits with rare min A from sposue    Time 1    Period Weeks    Status On-going      SLP LONG TERM GOAL #4   Title Pt will alternate attention on cognitive linguistic task for 20 minutes with 3 or less redirections    Time 1    Period Weeks    Status On-going      SLP LONG TERM GOAL #5   Title [name] will improve score on Neuro-QOL Cognitive Function short form by 3 points    Baseline Radwan - 16; [name] - 13    Time 1    Period Weeks    Status On-going              Plan - 06/06/21 0910     Clinical Impression Statement 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 Re-educated previous recommendations due to errors and confusion reported. Error awareness is somewhat improving, however he continue to require usual min A. Impulisivity is reducing, which is also helping error awareness. All medical, legal, insurance information must go through [facility] and be provided in writing so Lucille can  process this at his own speed. Continue skilled ST to maximize cognition for safety, QOL and reduce caregiver burden/frustration    Speech Therapy Frequency 2x / week    Duration 12  weeks    Treatment/Interventions Compensatory strategies;Patient/family education;Functional tasks;Cueing hierarchy;Multimodal communcation approach;Cognitive reorganization;Environmental controls;SLP instruction and feedback;Internal/external aids;Compensatory techniques;Language facilitation    Potential to Achieve Goals Good    Potential Considerations Severity of impairments             Patient will benefit from skilled therapeutic intervention in order to improve the following deficits and impairments:   Cognitive communication deficit    Problem List Patient Active Problem List   Diagnosis Date Noted   Dizziness and giddiness 02/03/2021   Cervical spondylosis without myelopathy 05/06/2020   Myalgia 04/22/2020   Sleep disturbance 04/05/2020   Neck pain 04/05/2020   Post concussive syndrome 03/22/2020   Nausea without vomiting 03/22/2020   Chest pain 08/19/2017   Hypertensive urgency 08/19/2017   Chest pain with moderate risk for cardiac etiology    ST elevation    Hypokalemia 09/23/2014   Abnormal EKG 09/23/2014   Hyperlipidemia 09/23/2014   Accelerated hypertension 09/21/2014    Alinda Deem, CCC-SLP 06/06/2021, 9:27 AM  [facility] [address] [place], [place], [address] Phone: [phone]   Fax:  [phone]   Name: DEARIUS HOFFMANN MRN: [id number] Date of Birth: 09/06/1955

## 2021-06-08 ENCOUNTER — Encounter: Payer: Self-pay | Admitting: Speech Pathology

## 2021-06-08 ENCOUNTER — Other Ambulatory Visit: Payer: Self-pay

## 2021-06-08 ENCOUNTER — Ambulatory Visit: Payer: No Typology Code available for payment source | Admitting: Speech Pathology

## 2021-06-08 DIAGNOSIS — R41841 Cognitive communication deficit: Secondary | ICD-10-CM

## 2021-06-08 NOTE — Therapy (Signed)
[facility] [address] [place], [place], [address] Phone: [phone]   Fax:  [phone]  Speech Language Pathology Treatment  Patient Details  Name: Craig Hart MRN: [id number] Date of Birth: 1955-08-03 Referring Provider (SLP): Dr. Alger Hart   Encounter Date: 06/08/2021   End of Session - 06/08/21 1310     Visit Number 23    Number of Visits 30    Date for SLP Re-Evaluation 08/03/21    Authorization Type WC - 26 - will request 4 more then d/c - await approval    Authorization - Visit Number 23    Authorization - Number of Visits 26    SLP Start Time 1100    SLP Stop Time  2505    SLP Time Calculation (min) 45 min             Past Medical History:  Diagnosis Date   Hypertension    Hypertensive emergency 09/21/2014    Past Surgical History:  Procedure Laterality Date   LEFT HEART CATHETERIZATION WITH CORONARY ANGIOGRAM N/A 09/21/2014   Procedure: LEFT HEART CATHETERIZATION WITH CORONARY ANGIOGRAM;  Surgeon: Sherren Mocha, MD;  Location: [facility] CATH LAB;  Service: Cardiovascular:  minimal LAD disease.  20-30% mid RCA disease with a right dominant system.  EF 65-70%. --False activation of ST elevation MI.   TONSILLECTOMY      There were no vitals filed for this visit.   Subjective Assessment - 06/08/21 1106     Subjective "I have a new appointment" re: podiatrist    Currently in Pain? Yes    Pain Score 8     Pain Location Shoulder    Pain Orientation Left    Pain Descriptors / Indicators Aching    Pain Type Chronic pain    Pain Onset More than a month ago    Pain Frequency Constant                   ADULT SLP TREATMENT - 06/08/21 1109       General Information   Behavior/Cognition Alert;Cooperative;Pleasant mood      Treatment Provided   Treatment provided Cognitive-Linquistic      Cognitive-Linquistic Treatment   Treatment focused on Cognition;Patient/family/caregiver  education    Skilled Treatment Pt ID'd day of shoulder surgery day mod I. Asked Craig Hart about Craig Hart's opinion on his working on cars. He feels this is a safety issue, but he continues to work on cars with his son, directing and instructing his son to do the work. Craig Hart also worked with his son to fix the element on his mother's stove successfully, with accurate sequencing steps, recalling details, and problem solving. Craig Hart reports difficulty with deduction HW. Craig Hart reported he forgot to fix his mom's stove for a couple of weeks. Reminded him to use his to do list for tasks such as this.      Assessment / Recommendations / Plan   Plan Continue with current plan of care      Progression Toward Goals   Progression toward goals Progressing toward goals              SLP Education - 06/08/21 1308     Education Details use to do list, add podiatrist to calendar    Person(s) Educated Patient    Methods Explanation;Demonstration    Comprehension Verbalized understanding;Returned demonstration              SLP Short Term Goals - 06/08/21 1310  SLP SHORT TERM GOAL #1   Title Will complete CLQT and modify goals as needed    Status Achieved      SLP SHORT TERM GOAL #2   Title Pt will keep calendar with appointments and daily schedule to complete 1 household task a day and 1 cognitive task a day for 10-15 minutes.    Status Achieved      SLP SHORT TERM GOAL #3   Title Pt  will carryover 2 strategies for processing and recalling information from healthcare providers with occasional min A over 2 sessions    Status Partially Met      SLP SHORT TERM GOAL #4   Title Craig Hart will attend to simple cognitive linguistic task for 15 minutes with 3 or less redirections over 3 sessions    Baseline 04-18-21, 04-22-21; 04-25-21    Status Achieved      SLP SHORT TERM GOAL #5   Title Pt will use pill organizer, alarm if needed or other compensation to manage his medication with no  misses/mistakes over 1 week    Status Achieved              SLP Long Term Goals - 06/08/21 1310       SLP LONG TERM GOAL #1   Title Pt will manage his schedule and time to be ready for appointtments and completed 2 chores a day and 2 cognitive activities for 15 minutes    Baseline 05/09/21, 05/30/21    Status Achieved      SLP LONG TERM GOAL #2   Title Pt/family will carryover 3 compensations for pt to participate in  4 turns in a conversation or 4 instructions without need for repeptition    Status Achieved      SLP LONG TERM GOAL #3   Title Pt will plan and carrout a visit with 1 friend/extended family member on 3 occasions for 20 minute visits with rare min A from sposue    Time 1    Period Weeks    Status On-going      SLP LONG TERM GOAL #4   Title Pt will alternate attention on cognitive linguistic task for 20 minutes with 3 or less redirections    Time 1    Period Weeks    Status On-going      SLP LONG TERM GOAL #5   Title [name] will improve score on Neuro-QOL Cognitive Function short form by 3 points    Baseline Craig Hart - 16; Craig Hart - 13    Time 1    Period Weeks    Status On-going              Plan - 06/08/21 1308     Clinical Impression Statement 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 Re-educated previous recommendations due to errors and confused reported. Error awareness is somewhat improving, however he continue to require usual min A. Impulisivity is reducing, which is also helping error awareness. All medical, legal, insurance information must go through Craig Hart and be provided in writing so Craig Hart can process this at his own speed. Continue skilled ST to maximize cognition for safety, QOL and reduce caregiver burden/frustration. Will request 4 more visits from Craig Hart, for 1x a week, then will plan to d/c until neuropsych eval complete. Wrote note to  spouse re: this plan. re-cert completed today for total of 30 visits.    Speech Therapy Frequency  2x / week    Duration 12 weeks    Treatment/Interventions Compensatory strategies;Patient/family education;Functional tasks;Cueing hierarchy;Multimodal communcation approach;Cognitive reorganization;Environmental controls;SLP instruction and feedback;Internal/external aids;Compensatory techniques;Language facilitation    Potential to Achieve Goals Good             Patient will benefit from skilled therapeutic intervention in order to improve the following deficits and impairments:   Cognitive communication deficit    Problem List Patient Active Problem List   Diagnosis Date Noted   Dizziness and giddiness 02/03/2021   Cervical spondylosis without myelopathy 05/06/2020   Myalgia 04/22/2020   Sleep disturbance 04/05/2020   Neck pain 04/05/2020   Post concussive syndrome 03/22/2020   Nausea without vomiting 03/22/2020   Chest pain 08/19/2017   Hypertensive urgency 08/19/2017   Chest pain with moderate risk for cardiac etiology    ST elevation    Hypokalemia 09/23/2014   Abnormal EKG 09/23/2014   Hyperlipidemia 09/23/2014   Accelerated hypertension 09/21/2014    Spencer Cardinal, Annye Rusk, CCC-SLP 06/08/2021, 1:12 PM  [facility] [address] [place], [place], [address] Phone: [phone]   Fax:  [phone]   Name: Craig Hart MRN: [id number] Date of Birth: [birth year]

## 2021-06-08 NOTE — Patient Instructions (Addendum)
° °  Craig Hart is doing a great job  We are thinking about asking for 4 more visits where he will come 1x a week for 4 more weeks, then we will discharge him.  After neuropsych eval, we can see how is doing and if he wants to come back for a short course of ST at that time  After we get approval for the 4 more visits, we will get them scheduled  The brain heals in it's own time, so all of the ST can't make it heal as fast as we want.

## 2021-06-15 ENCOUNTER — Ambulatory Visit: Payer: Self-pay | Admitting: Podiatry

## 2021-06-16 ENCOUNTER — Ambulatory Visit: Payer: No Typology Code available for payment source

## 2021-06-16 ENCOUNTER — Other Ambulatory Visit: Payer: Self-pay

## 2021-06-16 DIAGNOSIS — R41841 Cognitive communication deficit: Secondary | ICD-10-CM

## 2021-06-16 NOTE — Therapy (Signed)
[facility] [address] [place], [place], [address] Phone: [phone]   Fax:  [phone]  Speech Language Pathology Treatment  Patient Details  Name: Craig Hart MRN: [id number] Date of Birth: 1956-06-06 Referring Provider (SLP): Dr. Alger Simons   Encounter Date: 06/16/2021   End of Session - 06/16/21 0843     Visit Number 24    Number of Visits 30    Date for SLP Re-Evaluation 08/03/21    Authorization Type WC - 30    Authorization - Visit Number 24    Authorization - Number of Visits 30    SLP Start Time 0845    SLP Stop Time  0929    SLP Time Calculation (min) 44 min    Activity Tolerance Patient tolerated treatment well             Past Medical History:  Diagnosis Date   Hypertension    Hypertensive emergency 09/21/2014    Past Surgical History:  Procedure Laterality Date   LEFT HEART CATHETERIZATION WITH CORONARY ANGIOGRAM N/A 09/21/2014   Procedure: LEFT HEART CATHETERIZATION WITH CORONARY ANGIOGRAM;  Surgeon: Sherren Mocha, MD;  Location: [facility] CATH LAB;  Service: Cardiovascular:  minimal LAD disease.  20-30% mid RCA disease with a right dominant system.  EF 65-70%. --False activation of ST elevation MI.   TONSILLECTOMY      There were no vitals filed for this visit.   Subjective Assessment - 06/16/21 0845     Subjective "My daughter is coming up soon"    Currently in Pain? Yes    Pain Score 7     Pain Location Shoulder                   ADULT SLP TREATMENT - 06/16/21 0843       General Information   Behavior/Cognition Alert;Cooperative;Pleasant mood      Treatment Provided   Treatment provided Cognitive-Linquistic      Cognitive-Linquistic Treatment   Treatment focused on Cognition;Patient/family/caregiver education    Skilled Treatment Pt arrived alone today. Pt independently utilized phone to clarify date when daughter was coming to town. Success using phone  calendar and alarm for management of appointments and medications reported. SLP reviewed HEP, in which pt completed with 80% accuracy. Pt able to correct error with occasional min to mod A. SLP provided intermittent breaks today as pt was not feeling well.      Assessment / Recommendations / Plan   Plan Continue with current plan of care   decrease to 1x/week     Progression Toward Goals   Progression toward goals Progressing toward goals                SLP Short Term Goals - 06/08/21 1310       SLP SHORT TERM GOAL #1   Title Will complete CLQT and modify goals as needed    Status Achieved      SLP SHORT TERM GOAL #2   Title Pt will keep calendar with appointments and daily schedule to complete 1 household task a day and 1 cognitive task a day for 10-15 minutes.    Status Achieved      SLP SHORT TERM GOAL #3   Title Pt  will carryover 2 strategies for processing and recalling information from healthcare providers with occasional min A over 2 sessions    Status Partially Met      SLP SHORT TERM GOAL #4   Title  Craig Hart will attend to simple cognitive linguistic task for 15 minutes with 3 or less redirections over 3 sessions    Baseline 04-18-21, 04-22-21; 04-25-21    Status Achieved      SLP SHORT TERM GOAL #5   Title Pt will use pill organizer, alarm if needed or other compensation to manage his medication with no misses/mistakes over 1 week    Status Achieved              SLP Long Term Goals - 06/16/21 0845       SLP LONG TERM GOAL #1   Title Pt will manage his schedule and time to be ready for appointtments and completed 2 chores a day and 2 cognitive activities for 15 minutes    Baseline 05/09/21, 05/30/21    Status Achieved      SLP LONG TERM GOAL #2   Title Pt/family will carryover 3 compensations for pt to participate in  4 turns in a conversation or 4 instructions without need for repeptition    Status Achieved      SLP LONG TERM GOAL #3   Title Pt will  plan and carrout a visit with 1 friend/extended family member on 3 occasions for 20 minute visits with rare min A from sposue    Time 1    Period Weeks    Status On-going   ongoing for recert   Target Date 96/78/93      SLP LONG TERM GOAL #4   Title Pt will alternate attention on cognitive linguistic task for 20 minutes with 3 or less redirections    Time 1    Period Weeks    Status On-going   ongoing for recert   Target Date 81/01/75      SLP LONG TERM GOAL #5   Title [name] will improve score on Neuro-QOL Cognitive Function short form by 3 points    Baseline [place] - 16; [name] - 13    Time 1    Period Weeks    Status On-going   ongoing for recert   Target Date 04/12/84              Plan - 06/16/21 0844     Clinical Impression Statement 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 Error awareness is somewhat improving, however he continue to require usual min to mod A. Impulisivity is reducing, which is also helping error awareness. All medical, legal, insurance information must go through [facility] and be provided in writing so Craig Hart can process this at his own speed. Continue skilled ST to maximize cognition for safety, QOL and reduce caregiver burden/frustration.    Speech Therapy Frequency 1x /week   decrease to 1x/week   Duration 12 weeks    Treatment/Interventions Compensatory strategies;Patient/family education;Functional tasks;Cueing hierarchy;Multimodal communcation approach;Cognitive reorganization;Environmental controls;SLP instruction and feedback;Internal/external aids;Compensatory techniques;Language facilitation    Potential to Achieve Goals Good             Patient will benefit from skilled therapeutic intervention in order to improve the following deficits and impairments:   Cognitive communication deficit    Problem List Patient Active Problem List    Diagnosis Date Noted   Dizziness and giddiness 02/03/2021   Cervical spondylosis without myelopathy 05/06/2020   Myalgia 04/22/2020   Sleep disturbance 04/05/2020   Neck pain 04/05/2020   Post concussive syndrome 03/22/2020   Nausea without vomiting 03/22/2020   Chest pain  08/19/2017   Hypertensive urgency 08/19/2017   Chest pain with moderate risk for cardiac etiology    ST elevation    Hypokalemia 09/23/2014   Abnormal EKG 09/23/2014   Hyperlipidemia 09/23/2014   Accelerated hypertension 09/21/2014    Alinda Deem, CCC-SLP 06/16/2021, 9:29 AM  [facility] [address] [place], [place], [address] Phone: [phone]   Fax:  [phone]   Name: Craig Hart MRN: [id number] Date of Birth: 07/16/1955

## 2021-06-23 ENCOUNTER — Ambulatory Visit: Payer: No Typology Code available for payment source | Attending: Family Medicine | Admitting: Speech Pathology

## 2021-06-23 ENCOUNTER — Encounter: Payer: Self-pay | Admitting: Speech Pathology

## 2021-06-23 ENCOUNTER — Other Ambulatory Visit: Payer: Self-pay

## 2021-06-23 DIAGNOSIS — R41841 Cognitive communication deficit: Secondary | ICD-10-CM | POA: Insufficient documentation

## 2021-06-23 NOTE — Therapy (Signed)
[facility] [address] [place], [place], [address] Phone: [phone]   Fax:  [phone]  Speech Language Pathology Treatment  Patient Details  Name: Craig Hart MRN: [id number] Date of Birth: [birth year] Referring Provider (SLP): Dr. Alger Simons   Encounter Date: 06/23/2021   End of Session - 06/23/21 1252     Visit Number 25    Number of Visits 30    Date for SLP Re-Evaluation 08/03/21    Authorization Type WC - 30    Authorization - Visit Number 25    Authorization - Number of Visits 30    SLP Start Time 7408    SLP Stop Time  1230    SLP Time Calculation (min) 45 min    Activity Tolerance Patient tolerated treatment well             Past Medical History:  Diagnosis Date   Hypertension    Hypertensive emergency 09/21/2014    Past Surgical History:  Procedure Laterality Date   LEFT HEART CATHETERIZATION WITH CORONARY ANGIOGRAM N/A 09/21/2014   Procedure: LEFT HEART CATHETERIZATION WITH CORONARY ANGIOGRAM;  Surgeon: Sherren Mocha, MD;  Location: [facility] CATH LAB;  Service: Cardiovascular:  minimal LAD disease.  20-30% mid RCA disease with a right dominant system.  EF 65-70%. --False activation of ST elevation MI.   TONSILLECTOMY      There were no vitals filed for this visit.   Subjective Assessment - 06/23/21 1153     Subjective "She's learning to sew"    Currently in Pain? Yes    Pain Score 7     Pain Location Shoulder    Pain Orientation Left    Pain Descriptors / Indicators Aching    Pain Type Chronic pain    Pain Onset More than a month ago    Pain Frequency Constant                   ADULT SLP TREATMENT - 06/23/21 1155       General Information   Behavior/Cognition Alert;Cooperative;Pleasant mood      Treatment Provided   Treatment provided Cognitive-Linquistic      Cognitive-Linquistic Treatment   Treatment focused on Cognition;Patient/family/caregiver education     Skilled Treatment Pt arrived alone. He was unaware of appointment next week. It is the day before his surgery. We added it to his calendar. Craig Hart filled out the [employer] Form and he scored a 21, indicating the most difficulty in having to pay attetion or he Craig Hart make a mistake, trouble concentrating and planningn for and keep appointments. This is a 5 point improvement since evaluation day. Craig Hart states that he feels "disconnected" an "not like himself" since the accident. Craig Hart continues to use his binder to keep appointments and dates with success with rare min A from spouse or ST. Targeted alternating attention, error awareness and spelling on complex naming task. Craig Hart spelled 1/12 words correctly, however he Id'd errors 10/12. Required copy cues to correct errors. Craig Hart alternated between written naming task and simple conversation 5/5x with 1 cue to remember category.      Assessment / Recommendations / Plan   Plan Continue with current plan of care      Progression Toward Goals   Progression toward goals Progressing toward goals                SLP Short Term Goals - 06/23/21 1251       SLP SHORT TERM  GOAL #1   Title Craig Hart complete CLQT and modify goals as needed    Status Achieved      SLP SHORT TERM GOAL #2   Title Pt Craig Hart keep calendar with appointments and daily schedule to complete 1 household task a day and 1 cognitive task a day for 10-15 minutes.    Status Achieved      SLP SHORT TERM GOAL #3   Title Pt  Craig Hart carryover 2 strategies for processing and recalling information from healthcare providers with occasional min A over 2 sessions    Status Partially Met      SLP SHORT TERM GOAL #4   Title Craig Hart Craig Hart attend to simple cognitive linguistic task for 15 minutes with 3 or less redirections over 3 sessions    Baseline 04-18-21, 04-22-21; 04-25-21    Status Achieved      SLP SHORT TERM GOAL #5   Title Pt Craig Hart use pill organizer, alarm if needed or  other compensation to manage his medication with no misses/mistakes over 1 week    Status Achieved              SLP Long Term Goals - 06/23/21 1251       SLP LONG TERM GOAL #1   Title Pt Craig Hart manage his schedule and time to be ready for appointtments and completed 2 chores a day and 2 cognitive activities for 15 minutes    Baseline 05/09/21, 05/30/21    Status Achieved      SLP LONG TERM GOAL #2   Title Pt/family Craig Hart carryover 3 compensations for pt to participate in  4 turns in a conversation or 4 instructions without need for repeptition    Status Achieved      SLP LONG TERM GOAL #3   Title Pt Craig Hart plan and carrout a visit with 1 friend/extended family member on 3 occasions for 20 minute visits with rare min A from sposue    Time 4    Period Weeks    Status On-going   ongoing for recert     SLP LONG TERM GOAL #4   Title Pt Craig Hart alternate attention on cognitive linguistic task for 20 minutes with 3 or less redirections    Baseline 06-23-21,    Time 4    Period Weeks    Status On-going   ongoing for recert     SLP LONG TERM GOAL #5   Title [name] Craig Hart improve score on Neuro-QOL Cognitive Function short form by 3 points    Baseline [place] - 16; [name] - 13    Time 1    Period Weeks    Status Achieved   ongoing for recert             Plan - 06/23/21 1243     Clinical Impression Statement Mild to moderate cognitive impairments presist, however significanlty reduced impulsivity and improved error awareness. Craig Hart is also verbalizing improved emergent and some anticiapatory awareness. Memory, organization and attention continues to be impaired. Continue skilled ST to maximize cognition for safety, QOL and reduce caregiver frustration    Speech Therapy Frequency 1x /week    Duration 12 weeks    Treatment/Interventions Compensatory strategies;Patient/family education;Functional tasks;Cueing hierarchy;Multimodal communcation approach;Cognitive  reorganization;Environmental controls;SLP instruction and feedback;Internal/external aids;Compensatory techniques;Language facilitation    Potential to Achieve Goals Good    Potential Considerations Severity of impairments             Patient Craig Hart benefit from skilled therapeutic  intervention in order to improve the following deficits and impairments:   Cognitive communication deficit    Problem List Patient Active Problem List   Diagnosis Date Noted   Dizziness and giddiness 02/03/2021   Cervical spondylosis without myelopathy 05/06/2020   Myalgia 04/22/2020   Sleep disturbance 04/05/2020   Neck pain 04/05/2020   Post concussive syndrome 03/22/2020   Nausea without vomiting 03/22/2020   Chest pain 08/19/2017   Hypertensive urgency 08/19/2017   Chest pain with moderate risk for cardiac etiology    ST elevation    Hypokalemia 09/23/2014   Abnormal EKG 09/23/2014   Hyperlipidemia 09/23/2014   Accelerated hypertension 09/21/2014    Craig Hart, Craig Hart, Craig Hart 06/23/2021, 12:54 PM  [facility] [address] [place], [place], [address] Phone: [phone]   Fax:  [phone]   Name: Craig Hart MRN: [id number] Date of Birth: 12-May-1956

## 2021-06-28 ENCOUNTER — Ambulatory Visit: Payer: No Typology Code available for payment source

## 2021-06-29 HISTORY — PX: SHOULDER SURGERY: SHX246

## 2021-07-21 ENCOUNTER — Ambulatory Visit (INDEPENDENT_AMBULATORY_CARE_PROVIDER_SITE_OTHER): Payer: BC Managed Care – PPO

## 2021-07-21 ENCOUNTER — Other Ambulatory Visit: Payer: Self-pay

## 2021-07-21 ENCOUNTER — Ambulatory Visit: Payer: BC Managed Care – PPO | Admitting: Podiatry

## 2021-07-21 DIAGNOSIS — M779 Enthesopathy, unspecified: Secondary | ICD-10-CM | POA: Diagnosis not present

## 2021-07-21 DIAGNOSIS — M7751 Other enthesopathy of right foot: Secondary | ICD-10-CM

## 2021-07-21 DIAGNOSIS — M205X1 Other deformities of toe(s) (acquired), right foot: Secondary | ICD-10-CM | POA: Diagnosis not present

## 2021-07-21 DIAGNOSIS — S99921A Unspecified injury of right foot, initial encounter: Secondary | ICD-10-CM

## 2021-07-21 MED ORDER — TRIAMCINOLONE ACETONIDE 10 MG/ML IJ SUSP
10.0000 mg | Freq: Once | INTRAMUSCULAR | Status: AC
  Administered 2021-07-21: 10 mg

## 2021-07-22 NOTE — Progress Notes (Signed)
Subjective:   Patient ID: Craig Hart, male   DOB: 66 y.o.   MRN: [id number]   HPI Patient presents with caregiver with a lot of discomfort in the right big toe joint and also around the right big toe.  States its been going on around 6 months after injury and is hard for him to be active and walk.  Patient does not smoke likes to be active   Review of Systems  All other systems reviewed and are negative.      Objective:  Physical Exam Vitals and nursing note reviewed.  Constitutional:      Appearance: He is well-developed.  Pulmonary:     Effort: Pulmonary effort is normal.  Musculoskeletal:        General: Normal range of motion.  Skin:    General: Skin is warm.  Neurological:     Mental Status: He is alert.    Neurovascular status was found to be intact muscle strength was found to be adequate range of motion adequate.  Patient is noted to have inflammation and pain around the first MPJ right foot fluid buildup with most the pain on the lateral side of the joint surface and is noted to have some thickening of the hallux nail bed.  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  I did discuss this and I went ahead today did sterile prep and injected around the first MPJ 3 mg Kenalog 5 mg Xylocaine and then went ahead and advised him on rigid bottom shoes and discussed the possibility at 1 point for removal of bone spurs with possible surgical intervention.  No treatment from nail at this current time but will be monitored  X-rays indicate large spur around the first MPJ right with narrowing to the joint surface lateral side

## 2021-07-27 ENCOUNTER — Other Ambulatory Visit: Payer: Self-pay | Admitting: Podiatry

## 2021-07-27 DIAGNOSIS — M779 Enthesopathy, unspecified: Secondary | ICD-10-CM

## 2021-07-31 ENCOUNTER — Other Ambulatory Visit: Payer: Self-pay

## 2021-07-31 ENCOUNTER — Emergency Department (HOSPITAL_BASED_OUTPATIENT_CLINIC_OR_DEPARTMENT_OTHER)
Admission: EM | Admit: 2021-07-31 | Discharge: 2021-07-31 | Disposition: A | Discharge status: Home / Self Care | Payer: No Typology Code available for payment source | Attending: Emergency Medicine | Admitting: Emergency Medicine

## 2021-07-31 ENCOUNTER — Encounter (HOSPITAL_BASED_OUTPATIENT_CLINIC_OR_DEPARTMENT_OTHER): Payer: Self-pay | Admitting: Obstetrics and Gynecology

## 2021-07-31 DIAGNOSIS — H7292 Unspecified perforation of tympanic membrane, left ear: Secondary | ICD-10-CM | POA: Diagnosis not present

## 2021-07-31 DIAGNOSIS — Z79899 Other long term (current) drug therapy: Secondary | ICD-10-CM | POA: Insufficient documentation

## 2021-07-31 DIAGNOSIS — H7392 Unspecified disorder of tympanic membrane, left ear: Secondary | ICD-10-CM

## 2021-07-31 DIAGNOSIS — H9202 Otalgia, left ear: Secondary | ICD-10-CM | POA: Diagnosis present

## 2021-07-31 MED ORDER — CIPROFLOXACIN-DEXAMETHASONE 0.3-0.1 % OT SUSP: 4.0000 [drp] | Freq: Two times a day (BID) | OTIC | 0 refills | Status: DC

## 2021-07-31 NOTE — Discharge Instructions (Addendum)
Place 4 drops in the left ear 2 times daily as described on the prescription that we are giving you.  I recommend that you follow-up with your PCP around 1 week to ensure resolution.  Continue to not put anything into the eardrum.  If you develop fever, chills, began having purulent drainage from the affected ear, or have acute complete hearing loss please return for further evaluation

## 2021-07-31 NOTE — ED Provider Notes (Addendum)
[facility] EMERGENCY DEPT Provider Note   CSN: [id number] Arrival date & time: 07/31/21  1031     History  Chief Complaint  Patient presents with   Otalgia    Craig Hart is a 66 y.o. male with no significant past medical history presents with concern for left ear pain, hearing loss.  Patient reports that he had his ear cleaned on Monday or Tuesday, patient reports that it feels stopped up and tender to the touch.  Patient reports that he has been trying to clean it with Q-tips himself.  Patient denies any dizziness, balance changes.  Patient denies any fever, chills.   Otalgia     Home Medications Prior to Admission medications   Medication Sig Start Date End Date Taking? Authorizing Provider  ciprofloxacin-dexamethasone (CIPRODEX) OTIC suspension Place 4 drops into the left ear 2 (two) times daily. 07/31/21  Yes Osten Janek H, PA-C  acetaminophen (TYLENOL) 500 MG tablet Take 1,000 mg by mouth every 8 (eight) hours as needed.    [provider]  amitriptyline (ELAVIL) 25 MG tablet TAKE 1 TABLET BY MOUTH EVERYDAY AT BEDTIME 03/10/21   Jamse Arn, MD  amLODipine (NORVASC) 10 MG tablet Take 1 tablet (10 mg total) by mouth every evening. Patient taking differently: Take 10 mg by mouth daily. 08/20/17   Geradine Girt, DO  chlorthalidone (HYGROTON) 25 MG tablet Take 25 mg by mouth daily. 02/01/21   [provider]  chlorthalidone (HYGROTON) 50 MG tablet Take 1 tablet by mouth daily. Patient not taking: Reported on 03/16/2021 02/15/21   [provider]  meloxicam (MOBIC) 15 MG tablet Take 15 mg by mouth daily. 01/14/21   [provider]  methocarbamol (ROBAXIN) 500 MG tablet Take 500-1,000 mg by mouth every 6 (six) hours as needed. 12/14/20   [provider]  ondansetron (ZOFRAN) 4 MG tablet Take 1 tablet (4 mg total) by mouth every 8 (eight) hours as needed for nausea or vomiting. 03/10/21   Jamse Arn, MD   tamsulosin (FLOMAX) 0.4 MG CAPS capsule Take 0.4 mg by mouth daily. 11/23/20   [provider]  traMADol (ULTRAM) 50 MG tablet Take 50 mg by mouth every 8 (eight) hours as needed. 11/02/20   [provider]  lisinopril (PRINIVIL,ZESTRIL) 40 MG tablet Take 1 tablet (40 mg total) by mouth every evening. Patient not taking: Reported on 12/02/2018 08/20/17 10/17/19  Geradine Girt, DO  pantoprazole (PROTONIX) 40 MG tablet Take 1 tablet (40 mg total) by mouth 2 (two) times daily before a meal. Patient not taking: Reported on 12/02/2018 08/21/17 10/17/19  Geradine Girt, DO      Allergies    Patient has no known allergies.    Review of Systems   Review of Systems  HENT:  Positive for ear pain.   All other systems reviewed and are negative.  Physical Exam Updated Vital Signs BP (!) 160/111 (BP Location: Right Arm)    Pulse 85    Temp 98.2 F (36.8 C) (Oral)    Resp 15    Ht 5\' 11"  (1.803 m)    Wt 102.1 kg    SpO2 96%    BMI 31.38 kg/m  Physical Exam Vitals and nursing note reviewed.  Constitutional:      General: He is not in acute distress.    Appearance: Normal appearance.  HENT:     Head: Normocephalic and atraumatic.     Comments: Left tympanic membrane is somewhat whitish  with debris.  The auditory canal is red and irritated.  TM is intact.  No evidence of infection of the auricle.    Right Ear: Tympanic membrane, ear canal and external ear normal. There is no impacted cerumen.     Left Ear: External ear normal.  Eyes:     General:        Right eye: No discharge.        Left eye: No discharge.  Cardiovascular:     Rate and Rhythm: Normal rate and regular rhythm.  Pulmonary:     Effort: Pulmonary effort is normal. No respiratory distress.  Musculoskeletal:        General: No deformity.  Skin:    General: Skin is warm and dry.  Neurological:     Mental Status: He is alert and oriented to person, place, and time.  Psychiatric:        Mood and Affect: Mood normal.         Behavior: Behavior normal.    ED Results / Procedures / Treatments   Labs (all labs ordered are listed, but only abnormal results are displayed) Labs Reviewed - No data to display  EKG None  Radiology No results found.  Procedures Procedures    Medications Ordered in ED Medications - No data to display  ED Course/ Medical Decision Making/ A&P                           Medical Decision Making Risk Prescription drug management.   I discussed this case with my attending physician who cosigned this note including patient's presenting symptoms, physical exam, and planned diagnostics and interventions. Attending physician stated agreement with plan or made changes to plan which were implemented.   Attending physician assessed patient at bedside.  Patient comes in with concern for left ear pain, fullness after having his ears cleaned earlier this week.  The emergent differential diagnosis includes acute otitis media, mastoiditis, TM rupture.  On physical exam I see a whitish TM with irritated auditory canal on the left.  Unclear whether patient had some irritation of the tympanic membrane secondary to cleaning, has an otitis externa infection overlying the tympanic membrane, some partial rupture.  Will administer Ciprodex drops, and encouraged Tylenol, ibuprofen.  Encourage patient to follow-up with his PCP in 1 week for recheck.  Patient discharged in stable condition at this time, return precautions given. Final Clinical Impression(s) / ED Diagnoses Final diagnoses:  Irritation of tympanic membrane of left ear    Rx / DC Orders ED Discharge Orders          Ordered    ciprofloxacin-dexamethasone (CIPRODEX) OTIC suspension  2 times daily        07/31/21 1214                 Carie Kapuscinski, Tracy, PA-C 07/31/21 1216    Sherwood Gambler, MD 08/03/21 [phone]

## 2021-07-31 NOTE — ED Notes (Signed)
Dc instructions reviewed with patient. Patient voiced understanding. Dc with belongings.  °

## 2021-07-31 NOTE — ED Triage Notes (Signed)
Patient reports tot he ER after having his ear cleaned on Monday or Tuesday earlier this week. Patient reports it still feels stopped up and tender to touch

## 2021-08-04 ENCOUNTER — Encounter: Payer: Self-pay | Admitting: Speech Pathology

## 2021-08-04 ENCOUNTER — Ambulatory Visit: Payer: No Typology Code available for payment source | Attending: Family Medicine | Admitting: Speech Pathology

## 2021-08-04 ENCOUNTER — Other Ambulatory Visit: Payer: Self-pay

## 2021-08-04 DIAGNOSIS — R41841 Cognitive communication deficit: Secondary | ICD-10-CM | POA: Diagnosis present

## 2021-08-04 NOTE — Patient Instructions (Signed)
° °  Beginning of week, go through [facility] activities and use questions and prompts to help him plan what he needs to do with paper work and Programmer, multimedia  Write it down on to do or calendar and review it  He may need cues to look at his to do or calendar during the week  Write a list of priorities separately - then compare and talk about it - keep them in his binder

## 2021-08-04 NOTE — Therapy (Signed)
[facility] [address] [place], [place], [address] Phone: [phone]   Fax:  [phone]  Speech Language Pathology Treatment  Patient Details  Name: Craig Hart MRN: [id number] Date of Birth: 09/28/1955 Referring Provider (SLP): Dr. Alger Simons   Encounter Date: 08/04/2021   End of Session - 08/04/21 1349     Visit Number 26    Number of Visits 30    Date for SLP Re-Evaluation 08/03/21    Authorization Type WC - 30    Authorization Time Period Will request 8 more visits over next 12 weeks    Authorization - Visit Number 18    Authorization - Number of Visits 30    SLP Start Time 1100    SLP Stop Time  8546    SLP Time Calculation (min) 45 min    Activity Tolerance Patient tolerated treatment well             Past Medical History:  Diagnosis Date   Hypertension    Hypertensive emergency 09/21/2014    Past Surgical History:  Procedure Laterality Date   LEFT HEART CATHETERIZATION WITH CORONARY ANGIOGRAM N/A 09/21/2014   Procedure: LEFT HEART CATHETERIZATION WITH CORONARY ANGIOGRAM;  Surgeon: Sherren Mocha, MD;  Location: [facility] CATH LAB;  Service: Cardiovascular:  minimal LAD disease.  20-30% mid RCA disease with a right dominant system.  EF 65-70%. --False activation of ST elevation MI.   TONSILLECTOMY      There were no vitals filed for this visit.   Subjective Assessment - 08/04/21 1112     Subjective "He had an ear infection" "He has not been doing ok"    Patient is accompained by: Family member   [name]   Currently in Pain? Yes    Pain Score 7     Pain Location Ear    Pain Orientation Left    Pain Descriptors / Indicators Aching;Numbness    Pain Type Acute pain    Pain Onset 1 to 4 weeks ago    Pain Frequency Constant                   ADULT SLP TREATMENT - 08/04/21 1118       General Information   Behavior/Cognition Alert;Cooperative;Pleasant mood      Treatment Provided    Treatment provided Cognitive-Linquistic      Cognitive-Linquistic Treatment   Treatment focused on Cognition;Patient/family/caregiver education    Skilled Treatment [name] reports Artemis is not initiating of looking ahead and being proactive about disability paperwork and insurance. She wants him to be more independent and rely on her less. Since last ST session, Nikan has had shoulder surgery and has restructions on arm movement affecting his ability to complete certain household tasks. Since surgery and ear infection, Alston has not kept up with his calendar and appointments. We will re-start this. Franky does complete handyman tasks and honey-do lists, however paperwork and follow ups are falling soley on [name]. I provided education that she will have to be the primary person responsible for these type of tasks due to [place]. Generated strategy of [name] and Rodrigues planning week in advance with a calendar where she can cue him what paper work or follow ups need to be done, write it in the calendar and cue Estanislado to look at his calendar. Also generated strategy of each of them writing their weeks priorities then comparing and discussing the week's priorities where they differ. She is also having to  remind Desten to complete his HEP for shoulder and supervise the exercises      Assessment / Recommendations / Plan   Plan Goals updated   recert     Progression Toward Goals   Progression toward goals Goals met and updated                SLP Short Term Goals - 08/04/21 1345       SLP SHORT TERM GOAL #1   Title Will complete CLQT and modify goals as needed    Status Achieved      SLP SHORT TERM GOAL #2   Title Pt will keep calendar with appointments and daily schedule to complete 1 household task a day and 1 cognitive task a day for 10-15 minutes.    Status Achieved      SLP SHORT TERM GOAL #3   Title Pt  will carryover 2 strategies for processing and recalling information from  healthcare providers with occasional min A over 2 sessions    Status Partially Met      SLP SHORT TERM GOAL #4   Title Squire will attend to simple cognitive linguistic task for 15 minutes with 3 or less redirections over 3 sessions    Baseline 04-18-21, 04-22-21; 04-25-21    Status Achieved      SLP SHORT TERM GOAL #5   Title Pt will use pill organizer, alarm if needed or other compensation to manage his medication with no misses/mistakes over 1 week    Status Achieved              SLP Long Term Goals - 08/04/21 1346       SLP LONG TERM GOAL #1   Title Pt will manage his schedule and time to be ready for appointtments and completed 2 chores a day and 2 cognitive activities for 15 minutes    Baseline 05/09/21, 05/30/21    Status Achieved      SLP LONG TERM GOAL #2   Title Pt/family will carryover 3 compensations for pt to participate in  4 turns in a conversation or 4 instructions without need for repeptition    Status Achieved      SLP LONG TERM GOAL #3   Title Pt will plan and carrout a visit with 1 friend/extended family member on 3 occasions for 20 minute visits with rare min A from sposue    Time 4    Period Weeks    Status Deferred   ongoing for recert     SLP LONG TERM GOAL #4   Title Pt will alternate attention on cognitive linguistic task for 20 minutes with 3 or less redirections    Baseline 06-23-21,    Time 12    Period Weeks    Status On-going   ongoing for recert     SLP LONG TERM GOAL #5   Title [name] will improve score on Neuro-QOL Cognitive Function short form by 3 points    Baseline Pj - 16; [name] - 13    Time 1    Period Weeks    Status Achieved   ongoing for recert     Additional Long Term Goals   Additional Long Term Goals Yes      SLP LONG TERM GOAL #6   Title Pt will initiate 2 episodes where he requests help from spouse to complete medical or insurance paperwork with use of to do list or calendar    Time 14  Period Weeks     Status New      SLP LONG TERM GOAL #7   Title Mckale will complete all HEP's with 3 or less reminders over 1 week using calendar or to do    Time [place] - 08/04/21 1341     Clinical Impression Statement Izell [place] and [name] return after 1 month due to shoulder surgery and ear infection. [name] reports reduced initation and follow up with insurance paperwork and wants Blade to be more proactive in his responsibilities. Education provided re: required family support s/p BI. [name] is requesting 8 more visits, a total of 10 from today. Will request with WC - Recert today - Euclide sees Dr. Sima Matas next month. Goals added    Duration --   24 weeks total   Treatment/Interventions Compensatory strategies;Patient/family education;Functional tasks;Cueing hierarchy;Multimodal communcation approach;Cognitive reorganization;Environmental controls;SLP instruction and feedback;Internal/external aids;Compensatory techniques;Language facilitation    Potential to Achieve Goals Good    Potential Considerations Severity of impairments             Patient will benefit from skilled therapeutic intervention in order to improve the following deficits and impairments:   Cognitive communication deficit    Problem List Patient Active Problem List   Diagnosis Date Noted   Dizziness and giddiness 02/03/2021   Cervical spondylosis without myelopathy 05/06/2020   Myalgia 04/22/2020   Sleep disturbance 04/05/2020   Neck pain 04/05/2020   Post concussive syndrome 03/22/2020   Nausea without vomiting 03/22/2020   Chest pain 08/19/2017   Hypertensive urgency 08/19/2017   Chest pain with moderate risk for cardiac etiology    ST elevation    Hypokalemia 09/23/2014   Abnormal EKG 09/23/2014   Hyperlipidemia 09/23/2014   Accelerated hypertension 09/21/2014    Nyima Vanacker, Annye Rusk, CCC-SLP 08/04/2021, 1:50 PM  [facility] [address] [place], [place], [address] Phone: [phone]   Fax:  [phone]   Name: LESTER CRICKENBERGER MRN: [id number] Date of Birth: 1955-08-04

## 2021-08-12 ENCOUNTER — Other Ambulatory Visit: Payer: Self-pay

## 2021-08-12 ENCOUNTER — Ambulatory Visit: Payer: No Typology Code available for payment source | Admitting: Speech Pathology

## 2021-08-12 DIAGNOSIS — R41841 Cognitive communication deficit: Secondary | ICD-10-CM

## 2021-08-12 NOTE — Therapy (Signed)
[facility] [address] [place], [place], [address] Phone: [phone]   Fax:  [phone]  Speech Language Pathology Treatment  Patient Details  Name: Craig Hart MRN: [id number] Date of Birth: 07-15-1955 Referring Provider (SLP): Dr. Alger Simons   Encounter Date: 08/12/2021   End of Session - 08/12/21 1347     Visit Number 27    Number of Visits 30    Date for SLP Re-Evaluation 10/27/21    Authorization Type WC - 30    Authorization Time Period Will request 8 more visits over next 12 weeks    Authorization - Visit Number 56    Authorization - Number of Visits 30    SLP Start Time 2505    SLP Stop Time  3976    SLP Time Calculation (min) 45 min    Activity Tolerance Patient tolerated treatment well             Past Medical History:  Diagnosis Date   Hypertension    Hypertensive emergency 09/21/2014    Past Surgical History:  Procedure Laterality Date   LEFT HEART CATHETERIZATION WITH CORONARY ANGIOGRAM N/A 09/21/2014   Procedure: LEFT HEART CATHETERIZATION WITH CORONARY ANGIOGRAM;  Surgeon: Sherren Mocha, MD;  Location: [facility] CATH LAB;  Service: Cardiovascular:  minimal LAD disease.  20-30% mid RCA disease with a right dominant system.  EF 65-70%. --False activation of ST elevation MI.   TONSILLECTOMY      There were no vitals filed for this visit.   Subjective Assessment - 08/12/21 1248     Subjective "i'm still not doing great"    Patient is accompained by: Family member   [name]   Currently in Pain? Yes    Pain Score 7     Pain Location Ear    Pain Orientation Left    Pain Descriptors / Indicators Aching    Pain Type Acute pain    Pain Onset 1 to 4 weeks ago    Pain Frequency Constant                   ADULT SLP TREATMENT - 08/12/21 1252       General Information   Behavior/Cognition Alert;Cooperative;Pleasant mood      Treatment Provided   Treatment provided  Cognitive-Linquistic      Cognitive-Linquistic Treatment   Treatment focused on Cognition;Patient/family/caregiver education    Skilled Treatment Antero and [name] report Vibhav was proactive in helping with insurance paperwork with [name]'s A. they looked a week ahead and generated a home to-do/calendar together and he required min questioning cues to ID priorities for the upcoming week. Error awareness targeted in deduction task - [place] required cues to ID that he wrote in wrong info and required cues to ID correct spot to write in chart. He alternated atention with occasioanl min a on task with 2 redirections      Assessment / Recommendations / Marvell with current plan of care      Progression Toward Goals   Progression toward goals Progressing toward goals                SLP Short Term Goals - 08/12/21 1347       SLP SHORT TERM GOAL #1   Title Will complete CLQT and modify goals as needed    Status Achieved      SLP SHORT TERM GOAL #2   Title Pt will keep calendar with appointments and  daily schedule to complete 1 household task a day and 1 cognitive task a day for 10-15 minutes.    Status Achieved      SLP SHORT TERM GOAL #3   Title Pt  will carryover 2 strategies for processing and recalling information from healthcare providers with occasional min A over 2 sessions    Status Partially Met      SLP SHORT TERM GOAL #4   Title Gianfranco will attend to simple cognitive linguistic task for 15 minutes with 3 or less redirections over 3 sessions    Baseline 04-18-21, 04-22-21; 04-25-21    Status Achieved      SLP SHORT TERM GOAL #5   Title Pt will use pill organizer, alarm if needed or other compensation to manage his medication with no misses/mistakes over 1 week    Status Achieved              SLP Long Term Goals - 08/04/21 1346       SLP LONG TERM GOAL #1   Title Pt will manage his schedule and time to be ready for appointtments and completed 2  chores a day and 2 cognitive activities for 15 minutes    Baseline 05/09/21, 05/30/21    Status Achieved      SLP LONG TERM GOAL #2   Title Pt/family will carryover 3 compensations for pt to participate in  4 turns in a conversation or 4 instructions without need for repeptition    Status Achieved      SLP LONG TERM GOAL #3   Title Pt will plan and carrout a visit with 1 friend/extended family member on 3 occasions for 20 minute visits with rare min A from sposue    Time 4    Period Weeks    Status Deferred   ongoing for recert     SLP LONG TERM GOAL #4   Title Pt will alternate attention on cognitive linguistic task for 20 minutes with 3 or less redirections    Baseline 06-23-21,    Time 12    Period Weeks    Status On-going   ongoing for recert     SLP LONG TERM GOAL #5   Title [name] will improve score on Neuro-QOL Cognitive Function short form by 3 points    Baseline Jonas - 16; [name] - 13    Time 1    Period Weeks    Status Achieved   ongoing for recert     Additional Long Term Goals   Additional Long Term Goals Yes      SLP LONG TERM GOAL #6   Title Pt will initiate 2 episodes where he requests help from spouse to complete medical or insurance paperwork with use of to do list or calendar    Time 12    Period Weeks    Status New      SLP LONG TERM GOAL #7   Title Obadiah will complete all HEP's with 3 or less reminders over 1 week using calendar or to do    Time 12    Period Weeks    Status New              Plan - 08/12/21 1341     Clinical Impression Statement Mild  cognitive communciation impairment persists with reduced error awareness, initiation requesting help in novel or high level tasks and executive function for weekly and monthly planning. Continue skilled ST to maximize cognition to reduce caregiver  burden and  return pt to PLOF    Speech Therapy Frequency 1x /week    Duration --   24 weeks   Treatment/Interventions Compensatory  strategies;Patient/family education;Functional tasks;Cueing hierarchy;Multimodal communcation approach;Cognitive reorganization;Environmental controls;SLP instruction and feedback;Internal/external aids;Compensatory techniques;Language facilitation    Potential to Achieve Goals Good    Potential Considerations Severity of impairments             Patient will benefit from skilled therapeutic intervention in order to improve the following deficits and impairments:   Cognitive communication deficit    Problem List Patient Active Problem List   Diagnosis Date Noted   Dizziness and giddiness 02/03/2021   Cervical spondylosis without myelopathy 05/06/2020   Myalgia 04/22/2020   Sleep disturbance 04/05/2020   Neck pain 04/05/2020   Post concussive syndrome 03/22/2020   Nausea without vomiting 03/22/2020   Chest pain 08/19/2017   Hypertensive urgency 08/19/2017   Chest pain with moderate risk for cardiac etiology    ST elevation    Hypokalemia 09/23/2014   Abnormal EKG 09/23/2014   Hyperlipidemia 09/23/2014   Accelerated hypertension 09/21/2014    Adley Castello, Annye Rusk, CCC-SLP 08/12/2021, 1:48 PM  [facility] [address] [place], [place], [address] Phone: [phone]   Fax:  [phone]   Name: CHRISOTPHER RIVERO MRN: [id number] Date of Birth: 1955/07/26

## 2021-08-15 ENCOUNTER — Other Ambulatory Visit: Payer: Self-pay

## 2021-08-15 ENCOUNTER — Encounter: Payer: Self-pay | Admitting: Speech Pathology

## 2021-08-15 ENCOUNTER — Ambulatory Visit: Payer: No Typology Code available for payment source | Admitting: Speech Pathology

## 2021-08-15 DIAGNOSIS — R41841 Cognitive communication deficit: Secondary | ICD-10-CM

## 2021-08-15 NOTE — Therapy (Signed)
[facility] [address] [place], [place], [address] Phone: [phone]   Fax:  [phone]  Speech Language Pathology Treatment  Patient Details  Name: Craig Hart MRN: [id number] Date of Birth: [birth year] Referring Provider (SLP): Dr. Alger Hart   Encounter Date: 08/15/2021   End of Session - 08/15/21 1325     Visit Number 28    Number of Visits 30    Date for SLP Re-Evaluation 10/27/21    Authorization Type WC - 30    Authorization Time Period Will request 6 more visits over next 12 weeks    Authorization - Visit Number 28    Authorization - Number of Visits 30    SLP Start Time 5597    SLP Stop Time  4163    SLP Time Calculation (min) 45 min    Activity Tolerance Patient tolerated treatment well             Past Medical History:  Diagnosis Date   Hypertension    Hypertensive emergency 09/21/2014    Past Surgical History:  Procedure Laterality Date   LEFT HEART CATHETERIZATION WITH CORONARY ANGIOGRAM N/A 09/21/2014   Procedure: LEFT HEART CATHETERIZATION WITH CORONARY ANGIOGRAM;  Surgeon: Sherren Mocha, MD;  Location: [facility] CATH LAB;  Service: Cardiovascular:  minimal LAD disease.  20-30% mid RCA disease with a right dominant system.  EF 65-70%. --False activation of ST elevation MI.   TONSILLECTOMY      There were no vitals filed for this visit.   Subjective Assessment - 08/15/21 1234     Subjective "Maybe we did see a doctor"    Patient is accompained by: Family member   Craig Hart   Currently in Pain? Yes    Pain Score 7     Pain Location Ear    Pain Orientation Left    Pain Descriptors / Indicators Aching    Pain Type Acute pain                   ADULT SLP TREATMENT - 08/15/21 1236       General Information   Behavior/Cognition Alert;Cooperative;Pleasant mood      Treatment Provided   Treatment provided Cognitive-Linquistic      Cognitive-Linquistic Treatment   Treatment  focused on Cognition;Patient/family/caregiver education    Skilled Treatment [place] completed [place] with some help from Craig Hart. Today, targeted alternating attention, word finding and error awareness in complex naming task, writing words. Craig Hart named/wrote 12 words, IDing and correcting spelling errors on each word. Craig Hart alternated attention between conversation and naming/writing task with 1 re-direction out of 12 words. Craig Hart independently used his phone to correct 12 spelling errors.Craig Hart continues to report that Craig Hart is forgetting to do things she asks him to do. Instructed them to re-start the to do list in his binder for next session and to cue Craig Hart to look at the list. She also reports he is dependent on her for appointment recall, however today, Craig Hart recalled dates of PT and MD appoinments and independently used his calendar to double check.      Assessment / Recommendations / Plan   Plan Continue with current plan of care      Progression Toward Goals   Progression toward goals Progressing toward goals              SLP Education - 08/15/21 1323     Education Details use to do list and calendar    Person(s) Educated Patient;Spouse  Methods Verbal cues    Comprehension Verbalized understanding              SLP Short Term Goals - 08/15/21 1324       SLP SHORT TERM GOAL #1   Title Will complete CLQT and modify goals as needed    Status Achieved      SLP SHORT TERM GOAL #2   Title Pt will keep calendar with appointments and daily schedule to complete 1 household task a day and 1 cognitive task a day for 10-15 minutes.    Status Achieved      SLP SHORT TERM GOAL #3   Title Pt  will carryover 2 strategies for processing and recalling information from healthcare providers with occasional min A over 2 sessions    Status Partially Met      SLP SHORT TERM GOAL #4   Title Craig Hart will attend to simple cognitive linguistic task for 15 minutes with 3 or less  redirections over 3 sessions    Baseline 04-18-21, 04-22-21; 04-25-21    Status Achieved      SLP SHORT TERM GOAL #5   Title Pt will use pill organizer, alarm if needed or other compensation to manage his medication with no misses/mistakes over 1 week    Status Achieved              SLP Long Term Goals - 08/15/21 1324       SLP LONG TERM GOAL #1   Title Pt will manage his schedule and time to be ready for appointtments and completed 2 chores a day and 2 cognitive activities for 15 minutes    Baseline 05/09/21, 05/30/21    Status Achieved      SLP LONG TERM GOAL #2   Title Pt/family will carryover 3 compensations for pt to participate in  4 turns in a conversation or 4 instructions without need for repeptition    Status Achieved      SLP LONG TERM GOAL #3   Title Pt will plan and carrout a visit with 1 friend/extended family member on 3 occasions for 20 minute visits with rare min A from Craig Hart    Time 4    Period Weeks    Status Deferred   ongoing for recert     SLP LONG TERM GOAL #4   Title Pt will alternate attention on cognitive linguistic task for 20 minutes with 3 or less redirections    Baseline 06-23-21,    Time 12    Period Weeks    Status On-going   ongoing for recert     SLP LONG TERM GOAL #5   Title Pt or Craig Hart will improve score on Neuro-QOL Cognitive Function short form by 3 points    Baseline Spike - 16; [name] - 13    Time 1    Period Weeks    Status Achieved   ongoing for recert     SLP LONG TERM GOAL #6   Title Pt will initiate 2 episodes where he requests help from spouse to complete medical or insurance paperwork with use of to do list or calendar    Time 11    Period Weeks    Status On-going      SLP LONG TERM GOAL #7   Title Craig Hart will complete all HEP's with 3 or less reminders over 1 week using calendar or to do    Time 11    Period Weeks    Status On-going  Plan - 08/15/21 1323     Clinical Impression Statement Mild   cognitive communciation impairment persists with reduced error awareness, initiation requesting help in novel or high level tasks and executive function for weekly and monthly planning. Continue skilled ST to maximize cognition to reduce caregiver burden and  return pt to PLOF    Speech Therapy Frequency 1x /week    Duration --   24 weeks   Treatment/Interventions Compensatory strategies;Patient/family education;Functional tasks;Cueing hierarchy;Multimodal communcation approach;Cognitive reorganization;Environmental controls;SLP instruction and feedback;Internal/external aids;Compensatory techniques;Language facilitation    Potential to Achieve Goals Good             Patient will benefit from skilled therapeutic intervention in order to improve the following deficits and impairments:   Cognitive communication deficit    Problem List Patient Active Problem List   Diagnosis Date Noted   Dizziness and giddiness 02/03/2021   Cervical spondylosis without myelopathy 05/06/2020   Myalgia 04/22/2020   Sleep disturbance 04/05/2020   Neck pain 04/05/2020   Post concussive syndrome 03/22/2020   Nausea without vomiting 03/22/2020   Chest pain 08/19/2017   Hypertensive urgency 08/19/2017   Chest pain with moderate risk for cardiac etiology    ST elevation    Hypokalemia 09/23/2014   Abnormal EKG 09/23/2014   Hyperlipidemia 09/23/2014   Accelerated hypertension 09/21/2014    Craig Hart, Annye Rusk, Chauncey 08/15/2021, 1:26 PM  [facility] [address] [place], [place], [address] Phone: [phone]   Fax:  [phone]   Name: JONATHYN CAROTHERS MRN: [id number] Date of Birth: 22-Aug-1955

## 2021-08-17 ENCOUNTER — Encounter
Payer: No Typology Code available for payment source | Attending: Physical Medicine & Rehabilitation | Admitting: Physical Medicine & Rehabilitation

## 2021-08-17 ENCOUNTER — Encounter: Payer: Self-pay | Admitting: Physical Medicine & Rehabilitation

## 2021-08-17 ENCOUNTER — Other Ambulatory Visit: Payer: Self-pay

## 2021-08-17 VITALS — BP 160/105 | HR 82 | Ht 71.0 in | Wt 223.4 lb

## 2021-08-17 DIAGNOSIS — R110 Nausea: Secondary | ICD-10-CM | POA: Diagnosis present

## 2021-08-17 DIAGNOSIS — F329 Major depressive disorder, single episode, unspecified: Secondary | ICD-10-CM | POA: Insufficient documentation

## 2021-08-17 DIAGNOSIS — G479 Sleep disorder, unspecified: Secondary | ICD-10-CM | POA: Insufficient documentation

## 2021-08-17 DIAGNOSIS — F0781 Postconcussional syndrome: Secondary | ICD-10-CM | POA: Insufficient documentation

## 2021-08-17 MED ORDER — QUETIAPINE FUMARATE 25 MG PO TABS: 25.0000 mg | ORAL_TABLET | Freq: Every day | ORAL | 3 refills | Status: DC

## 2021-08-17 MED ORDER — ESCITALOPRAM OXALATE 5 MG PO TABS: 5.0000 mg | ORAL_TABLET | Freq: Every day | ORAL | 4 refills | Status: DC

## 2021-08-17 NOTE — Patient Instructions (Signed)
PLEASE FEEL FREE TO CALL OUR OFFICE WITH ANY PROBLEMS OR QUESTIONS ([phone])      

## 2021-08-17 NOTE — Progress Notes (Signed)
? ?Subjective:  ? ? Patient ID: Craig Hart, male    DOB: 1956/06/16, 66 y.o.   MRN: [id number] ? ?HPI ? ?Mr. Kopecky is here in follow up of his PCS and associated problems. He has continued with SLP at [facility] neuro rehab.  He has been out of outpatient physical therapy for some time.  He has not seen neuro optometry as of yet.  He continues to struggle with daily nausea as well as difficulty with focus and balance.  He struggles with vision for the most part and with tracking.  He describes it as uncomfortable or painful even at times.  He is sensitive to light as well. ? ?Sleep has improved a bit as he is able to fall asleep a bit better but is unable to stay asleep.  He still is very sleepy during the day taking frequent catnaps, keeping eyes closed etc.  His mood has been more depressed and irritable as well.  Wife remains very supportive.  She is helping him with self-care and everyday living activities at home due to his symptoms.  He finds it difficult to even do some of his basic personal care tasks due to his balance and visual deficits. ? ?He complained to me today of his left ear continued to be sensitive and at nighttime having loud noises and ringing.  He does have an appointment with ENT coming up later this month. ? ?Pain Inventory ?Average Pain 7 ?Pain Right Now 5 ?My pain is constant, sharp, and stabbing ? ?In the last 24 hours, has pain interfered with the following? ?General activity 9 ?Relation with others 10 ?Enjoyment of life 10 ?What TIME of day is your pain at its worst? morning , daytime, evening, and night ?Sleep (in general) Poor ? ?Pain is worse with: unsure and some activites ?Pain improves with: heat/ice and medication ?Relief from Meds: 6 ? ?Family History  ?Problem Relation Age of Onset  ? Hypertension Mother   ? Diabetes Mother   ? Hypertension Father   ? ?Social History  ? ?Socioeconomic History  ? Marital status: Married  ?  Spouse name: Not on file  ? Number of children:  Not on file  ? Years of education: Not on file  ? Highest education level: Not on file  ?Occupational History  ? Not on file  ?Tobacco Use  ? Smoking status: Never  ?  Passive exposure: Never  ? Smokeless tobacco: Never  ?Vaping Use  ? Vaping Use: Never used  ?Substance and Sexual Activity  ? Alcohol use: No  ? Drug use: No  ? Sexual activity: Yes  ?Other Topics Concern  ? Not on file  ?Social History Narrative  ? Not on file  ? ?Social Determinants of Health  ? ?Financial Resource Strain: Not on file  ?Food Insecurity: Not on file  ?Transportation Needs: Not on file  ?Physical Activity: Not on file  ?Stress: Not on file  ?Social Connections: Not on file  ? ?Past Surgical History:  ?Procedure Laterality Date  ? LEFT HEART CATHETERIZATION WITH CORONARY ANGIOGRAM N/A 09/21/2014  ? Procedure: LEFT HEART CATHETERIZATION WITH CORONARY ANGIOGRAM;  Surgeon: Tonny Bollman, MD;  Location: [facility] CATH LAB;  Service: Cardiovascular:  minimal LAD disease.  20-30% mid RCA disease with a right dominant system.  EF 65-70%. --False activation of ST elevation MI.  ? SHOULDER SURGERY Right 06/29/2021  ? TONSILLECTOMY    ? ?Past Surgical History:  ?Procedure Laterality Date  ? LEFT HEART CATHETERIZATION  WITH CORONARY ANGIOGRAM N/A 09/21/2014  ? Procedure: LEFT HEART CATHETERIZATION WITH CORONARY ANGIOGRAM;  Surgeon: Tonny Bollman, MD;  Location: [facility] CATH LAB;  Service: Cardiovascular:  minimal LAD disease.  20-30% mid RCA disease with a right dominant system.  EF 65-70%. --False activation of ST elevation MI.  ? SHOULDER SURGERY Right 06/29/2021  ? TONSILLECTOMY    ? ?Past Medical History:  ?Diagnosis Date  ? Hypertension   ? Hypertensive emergency 09/21/2014  ? ?BP (!) 160/105   Pulse 82   Ht 5\' 11"  (1.803 m)   Wt 223 lb 6.4 oz (101.3 kg)   SpO2 97%   BMI 31.16 kg/m?  ? ?Opioid Risk Score:   ?Fall Risk Score:  `1 ? ?Depression screen PHQ 2/9 ? ?Depression screen [facility] 2/9 08/17/2021 05/18/2021 03/10/2021 02/03/2021 08/05/2020 06/28/2020  05/06/2020  ?Decreased Interest 3 1 0 0 0 1 0  ?Down, Depressed, Hopeless 2 1 0 0 0 1 0  ?PHQ - 2 Score 5 2 0 0 0 2 0  ?Altered sleeping - - - - - - -  ?Tired, decreased energy - - - - - - -  ?Change in appetite - - - - - - -  ?Feeling bad or failure about yourself  - - - - - - -  ?Trouble concentrating - - - - - - -  ?Moving slowly or fidgety/restless - - - - - - -  ?Suicidal thoughts - - - - - - -  ?PHQ-9 Score - - - - - - -  ?  ? ?Review of Systems  ?Constitutional: Negative.   ?HENT: Negative.    ?Eyes: Negative.   ?Respiratory: Negative.    ?Cardiovascular: Negative.   ?Gastrointestinal:  Positive for nausea.  ?Endocrine: Negative.   ?Genitourinary: Negative.   ?Musculoskeletal:  Positive for neck pain.  ?Skin: Negative.   ?Allergic/Immunologic: Negative.   ?Neurological:  Positive for weakness.  ?Hematological: Negative.   ?Psychiatric/Behavioral:  Positive for dysphoric mood and sleep disturbance. The patient is nervous/anxious.   ? ?   ?Objective:  ? Physical Exam ? ?General: No acute distress ?HEENT: NCAT, EOMI, oral membranes moist ?Cards: reg rate  ?Chest: normal effort ?Abdomen: Soft, NT, ND ?Skin: dry, intact ?Extremities: no edema ?  ?Psych: Often keeps eyes closed during visit today.  Does open eyes and participate in exam when engaged and otherwise fairly pleasant. ?Neuro: Follows basic commands.  Needs some extra time for processing.  Tried to do some visual confrontation and tracking in the struggled with simple pursuit of my finger once again today.  Again developed nausea and diaphoresis.  Balance was fair.  Romberg test was positive.  strength is 4+ to 5 out of 5 in lower extremities.  Right upper extremity 4+ to 5 out of 5.  Left upper extremity 4 - to 4 out of 5 proximal distal.  Since pain and light touch in all 4. ?Musculoskeletal: Pain along posterior neck and upper shoulders. ?  ?  ?  ?  ?Assessment & Plan:  ?  ?1. Post concussive syndrome - predominantly with nausea >>> headaches,  balance, mood disturbance (loss of interest/frustration per wife), also with cognitive slowing and irritability. MRI report of brain was relatively unremarkable for head injury ?           Made referral to [facility] for PT and OT to address self-care and balance, vestibular issues.  He continues to demonstrate significant vestibular symptoms on examination ?  appointment with neuropsychology pending ?             SLP ongoing for cognition ?           ?            Continue Zofran to 4mg  TID for nausea, may use meclizine prior to therapies.  ?             -a referral has been previously been made to Neuro-optometrist ([facility]) for vision therapy/vision assessment-  ? -handicapped parking paperwork was completed today ?                         ?2. Sleep disturbance: An ongoing problem ?            -stop elavil begin trial of seroquel 25mg  at 9pm each evening ?            -still need to improve sleep-wake cycle ?            -avoid cat-naps during day ?            -Encouraging consistent bedtime and improve sleep hygiene              ?  -Discussed sleep hygiene today.  May benefit from some background noise in the room or some relaxation apps to help him rest. ?3. Myalgia         ?           ROM/ PT ONGOING ?  ?4. Cervical myelopathy ?            09/21/20 to C3-4 discectomy with fusion   ?             ?5. Irritability/depression ?             -begin SSRI   ? -Seroquel as above ?6. Left shoulder: per Dr. Luiz Blare ?7. Left ear pain/?tinnitus --ENT visit pending ?  ?  ?30 minutes of face to face patient care time were spent during this visit. All questions were encouraged and answered. Follow up with me in 2 months. Case manager was present today, case reviewed .  ? ? ? ?    ? ? ?

## 2021-08-18 ENCOUNTER — Ambulatory Visit: Payer: BC Managed Care – PPO | Admitting: Podiatry

## 2021-08-22 ENCOUNTER — Other Ambulatory Visit: Payer: Self-pay

## 2021-08-22 ENCOUNTER — Ambulatory Visit: Payer: No Typology Code available for payment source | Attending: Family Medicine | Admitting: Speech Pathology

## 2021-08-22 ENCOUNTER — Encounter: Payer: Self-pay | Admitting: Speech Pathology

## 2021-08-22 DIAGNOSIS — R2681 Unsteadiness on feet: Secondary | ICD-10-CM | POA: Diagnosis present

## 2021-08-22 DIAGNOSIS — R262 Difficulty in walking, not elsewhere classified: Secondary | ICD-10-CM | POA: Insufficient documentation

## 2021-08-22 DIAGNOSIS — M6281 Muscle weakness (generalized): Secondary | ICD-10-CM | POA: Insufficient documentation

## 2021-08-22 DIAGNOSIS — M25612 Stiffness of left shoulder, not elsewhere classified: Secondary | ICD-10-CM | POA: Insufficient documentation

## 2021-08-22 DIAGNOSIS — M25611 Stiffness of right shoulder, not elsewhere classified: Secondary | ICD-10-CM | POA: Insufficient documentation

## 2021-08-22 DIAGNOSIS — G44329 Chronic post-traumatic headache, not intractable: Secondary | ICD-10-CM | POA: Diagnosis present

## 2021-08-22 DIAGNOSIS — R41840 Attention and concentration deficit: Secondary | ICD-10-CM | POA: Diagnosis present

## 2021-08-22 DIAGNOSIS — M542 Cervicalgia: Secondary | ICD-10-CM | POA: Diagnosis present

## 2021-08-22 DIAGNOSIS — R278 Other lack of coordination: Secondary | ICD-10-CM | POA: Insufficient documentation

## 2021-08-22 DIAGNOSIS — R42 Dizziness and giddiness: Secondary | ICD-10-CM | POA: Insufficient documentation

## 2021-08-22 DIAGNOSIS — M25511 Pain in right shoulder: Secondary | ICD-10-CM | POA: Insufficient documentation

## 2021-08-22 DIAGNOSIS — R41844 Frontal lobe and executive function deficit: Secondary | ICD-10-CM | POA: Insufficient documentation

## 2021-08-22 DIAGNOSIS — R41841 Cognitive communication deficit: Secondary | ICD-10-CM | POA: Insufficient documentation

## 2021-08-22 NOTE — Patient Instructions (Addendum)
? ?  [place] - the goal is for you to be more independent with your appointments and not to rely on [name] to remind you ? ?Get back in habit of using you calendar for all of your appointments ? ?HW: make May calendar - add Dr. Riley Kill May 31 ? ?Put all other appointments in calendar ? ?Call to schedule PT and OT evaluations [phone] ? ?Check  calendar in the morning and before bed ? ?Use a to-do list when [name] asks you to do something, write it on a white board or tablet or your notebook and cross off after you are done. Notice if she is having to ask you repeatedly to get a task done - we want you to be more independent in remembering what she told you to do. ?

## 2021-08-24 NOTE — Therapy (Signed)
[facility], [place], [address] ?Phone: [phone]   Fax:  [phone] ? ?Speech Language Pathology Treatment ? ?Patient Details  ?Name: Craig Hart ?MRN: [id number] ?Date of Birth: 23-Aug-1955 ?Referring Provider (SLP): Dr. Alger Simons ? ? ?Encounter Date: 08/22/2021 ? ? End of Session - 08/24/21 1131   ? ? Visit Number 29   ? Number of Visits 36   ? Date for SLP Re-Evaluation 10/27/21   ? Authorization Type WC - 36   ? Authorization - Visit Number 29   ? Authorization - Number of Visits 36   ? SLP Start Time 1230   ? SLP Stop Time  1315   ? SLP Time Calculation (min) 45 min   ? Activity Tolerance Patient tolerated treatment well   ? ?  ?  ? ?  ? ? ?Past Medical History:  ?Diagnosis Date  ? Hypertension   ? Hypertensive emergency 09/21/2014  ? ? ?Past Surgical History:  ?Procedure Laterality Date  ? LEFT HEART CATHETERIZATION WITH CORONARY ANGIOGRAM N/A 09/21/2014  ? Procedure: LEFT HEART CATHETERIZATION WITH CORONARY ANGIOGRAM;  Surgeon: Sherren Mocha, MD;  Location: [facility] CATH LAB;  Service: Cardiovascular:  minimal LAD disease.  20-30% mid RCA disease with a right dominant system.  EF 65-70%. --False activation of ST elevation MI.  ? SHOULDER SURGERY Right 06/29/2021  ? TONSILLECTOMY    ? ? ?There were no vitals filed for this visit. ? ? ? ? ? ? ? ? ? ? ? SLP Education - 08/24/21 1130   ? ? Education Details get back to using calendar and not relying on [place] for appointments   ? Person(s) Educated Patient   ? Methods Explanation;Verbal cues;Handout   ? Comprehension Verbalized understanding;Verbal cues required;Need further instruction   ? ?  ?  ? ?  ? ? ? SLP Short Term Goals - 08/24/21 1131   ? ?  ? SLP SHORT TERM GOAL #1  ? Title Will complete CLQT and modify goals as needed   ? Status Achieved   ?  ? SLP SHORT TERM GOAL #2  ? Title Pt will keep calendar with appointments and daily schedule to complete 1 household task a  day and 1 cognitive task a day for 10-15 minutes.   ? Status Achieved   ?  ? SLP SHORT TERM GOAL #3  ? Title Pt  will carryover 2 strategies for processing and recalling information from healthcare providers with occasional min A over 2 sessions   ? Status Partially Met   ?  ? SLP SHORT TERM GOAL #4  ? Title Craig Hart will attend to simple cognitive linguistic task for 15 minutes with 3 or less redirections over 3 sessions   ? Baseline 04-18-21, 04-22-21; 04-25-21   ? Status Achieved   ?  ? SLP SHORT TERM GOAL #5  ? Title Pt will use pill organizer, alarm if needed or other compensation to manage his medication with no misses/mistakes over 1 week   ? Status Achieved   ? ?  ?  ? ?  ? ? ? SLP Long Term Goals - 08/24/21 1131   ? ?  ? SLP LONG TERM GOAL #1  ? Title Pt will manage his schedule and time to be ready for appointtments and completed 2 chores a day and 2 cognitive activities for 15 minutes   ? Baseline 05/09/21, 05/30/21   ? Status Achieved   ?  ? SLP LONG  TERM GOAL #2  ? Title Pt/family will carryover 3 compensations for pt to participate in  4 turns in a conversation or 4 instructions without need for repeptition   ? Status Achieved   ?  ? SLP LONG TERM GOAL #3  ? Title Pt will plan and carrout a visit with 1 friend/extended family member on 3 occasions for 20 minute visits with rare min A from sposue   ? Time 4   ? Period Weeks   ? Status Deferred   ongoing for recert  ?  ? SLP LONG TERM GOAL #4  ? Title Pt will alternate attention on cognitive linguistic task for 20 minutes with 3 or less redirections   ? Baseline 06-23-21,   ? Time 12   ? Period Weeks   ? Status On-going   ongoing for recert  ?  ? SLP LONG TERM GOAL #5  ? Title [name] will improve score on Neuro-QOL Cognitive Function short form by 3 points   ? Baseline Craig Hart - 16; Craig Hart - 13   ? Time 1   ? Period Weeks   ? Status Achieved   ongoing for recert  ?  ? SLP LONG TERM GOAL #6  ? Title Pt will initiate 2 episodes where he requests help from  spouse to complete medical or insurance paperwork with use of to do list or calendar   ? Time 11   ? Period Weeks   ? Status On-going   ?  ? SLP LONG TERM GOAL #7  ? Title Craig Hart will complete all HEP's with 3 or less reminders over 1 week using calendar or to do   ? Time 11   ? Period Weeks   ? Status On-going   ? ?  ?  ? ?  ? ? ? Plan - 08/24/21 1131   ? ? Clinical Impression Statement Mild  cognitive communciation impairment persists with reduced error awareness, initiation requesting help in novel or high level tasks and executive function for weekly and monthly planning. Continue skilled ST to maximize cognition to reduce caregiver burden and  return pt to PLOF   ? Speech Therapy Frequency 2x / week   ? Duration --   24 weeks  ? Treatment/Interventions Compensatory strategies;Patient/family education;Functional tasks;Cueing hierarchy;Multimodal communcation approach;Cognitive reorganization;Environmental controls;SLP instruction and feedback;Internal/external aids;Compensatory techniques;Language facilitation   ? Potential to Achieve Goals Good   ? ?  ?  ? ?  ? ? ?Patient will benefit from skilled therapeutic intervention in order to improve the following deficits and impairments:   ?Cognitive communication deficit ? ? ? ?Problem List ?Patient Active Problem List  ? Diagnosis Date Noted  ? Dizziness and giddiness 02/03/2021  ? Cervical spondylosis without myelopathy 05/06/2020  ? Myalgia 04/22/2020  ? Sleep disturbance 04/05/2020  ? Neck pain 04/05/2020  ? Post concussive syndrome 03/22/2020  ? Nausea without vomiting 03/22/2020  ? Chest pain 08/19/2017  ? Hypertensive urgency 08/19/2017  ? Chest pain with moderate risk for cardiac etiology   ? ST elevation   ? Hypokalemia 09/23/2014  ? Abnormal EKG 09/23/2014  ? Hyperlipidemia 09/23/2014  ? Accelerated hypertension 09/21/2014  ? ? ?Craig Hart, Annye Rusk, CCC-SLP ?08/24/2021, 11:33 AM ? ?[facility] ?[place], [place], [address] ?Phone: [phone]   Fax:  [phone] ? ? ?Name: Craig Hart ?MRN: [id number] ?Date of Birth: January 12, 1956 ? ?

## 2021-08-29 ENCOUNTER — Ambulatory Visit: Payer: No Typology Code available for payment source | Admitting: Speech Pathology

## 2021-08-29 ENCOUNTER — Encounter: Payer: Self-pay | Admitting: Speech Pathology

## 2021-08-29 ENCOUNTER — Telehealth (HOSPITAL_COMMUNITY): Payer: Self-pay | Admitting: Physical Medicine & Rehabilitation

## 2021-08-29 ENCOUNTER — Other Ambulatory Visit: Payer: Self-pay

## 2021-08-29 DIAGNOSIS — M47812 Spondylosis without myelopathy or radiculopathy, cervical region: Secondary | ICD-10-CM

## 2021-08-29 DIAGNOSIS — R41841 Cognitive communication deficit: Secondary | ICD-10-CM | POA: Diagnosis not present

## 2021-08-29 DIAGNOSIS — F0781 Postconcussional syndrome: Secondary | ICD-10-CM

## 2021-08-29 NOTE — Therapy (Signed)
Craig Hart [address] [place], [place], [address] Phone: [phone]   Fax:  [phone]  Speech Language Pathology Treatment  Patient Details  Name: Craig Hart MRN: [id number] Date of Birth: 09/08/1955 Referring Provider (SLP): Dr. Alger Simons   Encounter Date: 08/29/2021   End of Session - 08/29/21 1334     Visit Number 30    Number of Visits 36    Date for SLP Re-Evaluation 10/27/21    Authorization Type WC - 28    Authorization - Visit Number 30    Authorization - Number of Visits 73    SLP Start Time 8563    SLP Stop Time  1497    SLP Time Calculation (min) 46 min             Past Medical History:  Diagnosis Date   Hypertension    Hypertensive emergency 09/21/2014    Past Surgical History:  Procedure Laterality Date   LEFT HEART CATHETERIZATION WITH CORONARY ANGIOGRAM N/A 09/21/2014   Procedure: LEFT HEART CATHETERIZATION WITH CORONARY ANGIOGRAM;  Surgeon: Sherren Mocha, MD;  Location: [facility] CATH LAB;  Service: Cardiovascular:  minimal LAD disease.  20-30% mid RCA disease with a right dominant system.  EF 65-70%. --False activation of ST elevation MI.   SHOULDER SURGERY Right 06/29/2021   TONSILLECTOMY      There were no vitals filed for this visit.   Subjective Assessment - 08/29/21 1335     Subjective "I'm doing good"    Patient is accompained by: Family member   Craig Hart   Currently in Pain? Yes    Pain Score 8     Pain Location Ear    Pain Orientation Left    Pain Descriptors / Indicators Aching    Pain Type Acute pain    Pain Onset 1 to 4 weeks ago    Pain Frequency Constant    Pain Relieving Factors ENT appointment this week                   ADULT SLP TREATMENT - 08/29/21 1304       General Information   Behavior/Cognition Alert;Cooperative;Pleasant mood      Treatment Provided   Treatment provided Cognitive-Linquistic      Cognitive-Linquistic Treatment    Treatment focused on Cognition;Patient/family/caregiver education    Skilled Treatment Craig Hart reports success using calendar to be more independent. 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 With cues, Craig Hart added this to his calendar. Instrcuted Craig Hart to add all commitments to his calendar, not just medical appointments. With cues, [name] also added a Craig Hart show they are going to on Thursday at 7:00, to give examples on what to put on his calendar.  Craig Hart reports that although yesterday was cold and snowing, Craig Hart went out to lunch in short sleeves and no hat and came home wet. Craig Hart endorsed that he checked the weather, but "it didn't click" Added check weather and dress accordingly to top of his calendar as visual cue. Craig Hart reports [name] is doing household chores with reduced repetitive requests, however she has asked him daily to get a hair cut and he has not. We added this to his calendar. We generated a strategy of using a larger white board on the side of their fridge where she and [name] can write down the to do's, including  hair cut and shave, as well as errands or to do 's. Craig Hart is concerned about lack of motivation and apathy - instructer her to talk to [name] about this      Progression Toward Goals   Progression toward goals Progressing toward goals                SLP Short Term Goals - 08/29/21 1338       SLP SHORT TERM GOAL #1   Title Will complete CLQT and modify goals as needed    Status Achieved      SLP SHORT TERM GOAL #2   Title Pt will keep calendar with appointments and daily schedule to complete 1 household task a day and 1 cognitive task a day for 10-15 minutes.    Status Achieved      SLP SHORT TERM GOAL #3   Title Pt  will carryover 2 strategies for processing and recalling information from healthcare providers with occasional  min A over 2 sessions    Status Partially Met      SLP SHORT TERM GOAL #4   Title Craig Hart will attend to simple cognitive linguistic task for 15 minutes with 3 or less redirections over 3 sessions    Baseline 04-18-21, 04-22-21; 04-25-21    Status Achieved      SLP SHORT TERM GOAL #5   Title Pt will use pill organizer, alarm if needed or other compensation to manage his medication with no misses/mistakes over 1 week    Status Achieved              SLP Long Term Goals - 08/29/21 1338       SLP LONG TERM GOAL #1   Title Pt will manage his schedule and time to be ready for appointtments and completed 2 chores a day and 2 cognitive activities for 15 minutes    Baseline 05/09/21, 05/30/21    Status Achieved      SLP LONG TERM GOAL #2   Title Pt/family will carryover 3 compensations for pt to participate in  4 turns in a conversation or 4 instructions without need for repeptition    Status Achieved      SLP LONG TERM GOAL #3   Title Pt will plan and carrout a visit with 1 friend/extended family member on 3 occasions for 20 minute visits with rare min A from sposue    Time 4    Period Weeks    Status Deferred   ongoing for recert     SLP LONG TERM GOAL #4   Title Pt will alternate attention on cognitive linguistic task for 20 minutes with 3 or less redirections    Baseline 06-23-21,    Time 11    Period Weeks    Status On-going   ongoing for recert     SLP LONG TERM GOAL #5   Title [name] will improve score on Neuro-QOL Cognitive Function short form by 3 points    Baseline Craig Hart - 16; Craig Hart - 13    Time 1    Period Weeks    Status Achieved   ongoing for recert     SLP LONG TERM GOAL #6   Title Pt will initiate 2 episodes where he requests help from spouse to complete medical or insurance paperwork with use of to do list or calendar    Time 10    Period Weeks    Status On-going      SLP  LONG TERM GOAL #7   Title Craig Hart will complete all HEP's with 3 or less  reminders over 1 week using calendar or to do    Time 10    Period Weeks    Status On-going              Plan - 08/29/21 1336     Clinical Impression Statement Mild  cognitive communciation impairment persists with reduced error awareness, initiation requesting help in novel or high level tasks and executive function for weekly and monthly planning.  Craig Hart is keeping apppointments for medical however he requires cues to keep all commitments in his calendar to reduce burden on spouse who continues to have to remind him. They are to implement white board to do list and Craig Hart is to be intentional about checking weather before dressing. Continue skilled ST to maximize cognition to reduce caregiver burden and  return pt to PLOF    Speech Therapy Frequency 1x /week    Duration --   24   Treatment/Interventions Compensatory strategies;Patient/family education;Functional tasks;Cueing hierarchy;Multimodal communcation approach;Cognitive reorganization;Environmental controls;SLP instruction and feedback;Internal/external aids;Compensatory techniques;Language facilitation    Potential to Achieve Goals Good             Patient will benefit from skilled therapeutic intervention in order to improve the following deficits and impairments:   Cognitive communication deficit    Problem List Patient Active Problem List   Diagnosis Date Noted   Dizziness and giddiness 02/03/2021   Cervical spondylosis without myelopathy 05/06/2020   Myalgia 04/22/2020   Sleep disturbance 04/05/2020   Neck pain 04/05/2020   Post concussive syndrome 03/22/2020   Nausea without vomiting 03/22/2020   Chest pain 08/19/2017   Hypertensive urgency 08/19/2017   Chest pain with moderate risk for cardiac etiology    ST elevation    Hypokalemia 09/23/2014   Abnormal EKG 09/23/2014   Hyperlipidemia 09/23/2014   Accelerated hypertension 09/21/2014    Craig Hart, Annye Rusk, CCC-SLP 08/29/2021, 1:39 PM  [facility] [address] [place], [place], [address] Phone: [phone]   Fax:  [phone]   Name: DUEY LILLER MRN: [id number] Date of Birth: 1955-09-04

## 2021-08-29 NOTE — Telephone Encounter (Signed)
-----   Message from Callie Fielding sent at 08/29/2021 12:48 PM EDT ----- ?Good afternoon, ? ?Can you please send [place] a new referral for PT and OT for this patient. They were closed for some reason and we are unable to make the necessary changes to open and edit the referral. We will need two sperate ones for PT vestibular and OT help with ADLs ? ?

## 2021-08-29 NOTE — Patient Instructions (Addendum)
? ?  To Do list on the fridge ? ?Check weather - dress accordingly ? ? ?Write in all commitments on calendar, not just therapy and doc appointments ? ?Shave and a Haircut - 2 bits ?

## 2021-08-30 ENCOUNTER — Ambulatory Visit: Payer: No Typology Code available for payment source | Admitting: Physical Therapy

## 2021-08-30 DIAGNOSIS — G44329 Chronic post-traumatic headache, not intractable: Secondary | ICD-10-CM

## 2021-08-30 DIAGNOSIS — M542 Cervicalgia: Secondary | ICD-10-CM

## 2021-08-30 DIAGNOSIS — R262 Difficulty in walking, not elsewhere classified: Secondary | ICD-10-CM

## 2021-08-30 DIAGNOSIS — R41841 Cognitive communication deficit: Secondary | ICD-10-CM | POA: Diagnosis not present

## 2021-08-30 DIAGNOSIS — R2681 Unsteadiness on feet: Secondary | ICD-10-CM

## 2021-08-30 DIAGNOSIS — R42 Dizziness and giddiness: Secondary | ICD-10-CM
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
## 2021-09-01 ENCOUNTER — Ambulatory Visit: Payer: No Typology Code available for payment source | Admitting: Occupational Therapy

## 2021-09-01 ENCOUNTER — Other Ambulatory Visit: Payer: Self-pay

## 2021-09-01 ENCOUNTER — Encounter: Payer: Self-pay | Admitting: Occupational Therapy

## 2021-09-01 DIAGNOSIS — R41840 Attention and concentration deficit: Secondary | ICD-10-CM

## 2021-09-01 DIAGNOSIS — R278 Other lack of coordination: Secondary | ICD-10-CM

## 2021-09-01 DIAGNOSIS — M25511 Pain in right shoulder: Secondary | ICD-10-CM

## 2021-09-01 DIAGNOSIS — R41841 Cognitive communication deficit: Secondary | ICD-10-CM | POA: Diagnosis not present

## 2021-09-01 DIAGNOSIS — M25612 Stiffness of left shoulder, not elsewhere classified: Secondary | ICD-10-CM

## 2021-09-01 DIAGNOSIS — M6281 Muscle weakness (generalized): Secondary | ICD-10-CM

## 2021-09-01 DIAGNOSIS — R2681 Unsteadiness on feet: Secondary | ICD-10-CM

## 2021-09-01 DIAGNOSIS — R41844 Frontal lobe and executive function deficit: Secondary | ICD-10-CM

## 2021-09-01 DIAGNOSIS — R262 Difficulty in walking, not elsewhere classified: Secondary | ICD-10-CM

## 2021-09-01 DIAGNOSIS — M25611 Stiffness of right shoulder, not elsewhere classified: Secondary | ICD-10-CM

## 2021-09-01 NOTE — Therapy (Signed)
[place] [facility] ?[address] ?[place], [place], [address] ?Phone: [phone]   Fax:  [phone] ? ?Occupational Therapy Evaluation ? ?Patient Details  ?Name: Craig Hart ?MRN: [id number] ?Date of Birth: 1956-05-22 ?Referring Provider (OT): Dr. Riley Kill ? ? ?Encounter Date: 09/01/2021 ? ? OT End of Session - 09/01/21 1415   ? ? Visit Number 1   ? Number of Visits 25   ? Date for OT Re-Evaluation 11/24/21   ? Authorization Type Worker's comp, 12 visits approved, state [employer]   ? Authorization - Visit Number 1   ? Authorization - Number of Visits 12   ? OT Start Time 1408   ? OT Stop Time 1445   ? OT Time Calculation (min) 37 min   ? Activity Tolerance Patient limited by pain   ? Behavior During Therapy Anxious;Flat affect   ? ?  ?  ? ?  ? ? ?Past Medical History:  ?Diagnosis Date  ? Hypertension   ? Hypertensive emergency 09/21/2014  ? ? ?Past Surgical History:  ?Procedure Laterality Date  ? LEFT HEART CATHETERIZATION WITH CORONARY ANGIOGRAM N/A 09/21/2014  ? Procedure: LEFT HEART CATHETERIZATION WITH CORONARY ANGIOGRAM;  Surgeon: Tonny Bollman, MD;  Location: [facility] CATH LAB;  Service: Cardiovascular:  minimal LAD disease.  20-30% mid RCA disease with a right dominant system.  EF 65-70%. --False activation of ST elevation MI.  ? SHOULDER SURGERY Right 06/29/2021  ? TONSILLECTOMY    ? ? ?There were no vitals filed for this visit. ? ? Subjective Assessment - 09/01/21 1601   ? ? Subjective  Pt presents with post concussive syndrome   ? 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 MRI report of brain was relatively unremarkable for head injury Cervical myelopathy,  09/21/20  underwent C3-4 discectomy with fusion , R shoulder surgery 06/29/21  - for torn tendon and removal of bone spurs on 06/29/21- receiving PT for R shoulder   ? Patient Stated  Goals increase independence with ADLs/IADLS   ? Currently in Pain? Yes   ? Pain Score 8    ? Pain Location Shoulder   ear  ? Pain Orientation Right   ? Pain Descriptors / Indicators Aching   ? Pain Type Acute pain   ? Pain Onset 1 to 4 weeks ago   ? Pain Frequency Constant   ? Aggravating Factors  positioning, use   ? Pain Relieving Factors meds   ? ?  ?  ? ?  ? ? ? ? [facility] OT Assessment - 09/01/21 1415   ? ?  ? Assessment  ? Medical Diagnosis F07.81 (ICD-10-CM) - Post concussive syndrome   ? Referring Provider (OT) Dr. Riley Kill   ? Onset Date/Surgical Date 10/10/19   ? Hand Dominance Right   ?  ? Precautions  ? Precautions Shoulder - s/p surgery 06/29/21  ? Precaution Comments RUE -no lifting over 10 lbs, no pushing or pulling with, right shoulder   PT at another site is address R shoulder s/p surgery  ?  ? Balance Screen  ? Has the patient fallen in the past 6 months No   ? Has the patient had a decrease in activity level because of a fear of falling?  No   ? Is the patient reluctant to leave their home because of a fear of falling?  No   ?  ? Home  Environment  ?  Family/patient expects to be discharged to: Private residence   ? Lives With Spouse   ?  ? Prior Function  ? Level of Independence Independent   ? Vocation Counsellor   ?  ? ADL  ? Eating/Feeding Needs assist with cutting food   ? Grooming Minimal assistance   difficulty shaving  ? Upper Body Bathing Moderate assistance   ? Lower Body Bathing Maximal assistance   ? Upper Body Dressing Maximal assistance   ? Lower Body Dressing Moderate assistance   ? Toilet Transfer Modified independent   ? Toileting - Clothing Manipulation Modified independent   ? Tub/Shower Transfer Supervision/safety   ?  ? IADL  ? Shopping Needs to be accompanied on any shopping trip   ? Light Housekeeping Needs help with all home maintenance tasks   ? Meal Prep Needs to have meals prepared and served   ? Medication Management Is responsible for taking medication in correct  dosages at correct time   has reminder in phone  ? Financial Management Requires assistance   ?  ? Written Expression  ? Dominant Hand Right   ? Handwriting 100% legible   ?  ? Vision - History  ? Baseline Vision Wears glasses only for reading   ?  ? Vision Assessment  ? Vision Assessment Vision not tested   ? Comment Pt has visual changes related to concussion that cause nausea   Pt plans to see neuro optometry  ?  ? Cognition  ? Overall Cognitive Status Impaired/Different from baseline   ? Area of Impairment Attention;Memory;Safety/judgement;Problem solving   ? Memory Decreased short-term memory   ? Problem Solving Slow processing;Decreased initiation   ? Attention Selective   ? Memory Impaired   ? Problem Solving Impaired   ? Behaviors Poor frustration tolerance;Restless   ? Cognition Comments decreased initation   ?  ? Sensation  ? Light Touch Not tested   ?  ? Coordination  ? Gross Motor Movements are Fluid and Coordinated No   ? Fine Motor Movements are Fluid and Coordinated No   ? 9 Hole Peg Test Right;Left   ? Right 9 Hole Peg Test 44 sec   ? Left 9 Hole Peg Test 39.12   ? Coordination impaired bilaterally R>L   ?  ? ROM / Strength  ? AROM / PROM / Strength AROM;Strength   ?  ? AROM  ? Overall AROM  Deficits   ? Overall AROM Comments R shoulder flexion 95, and decreased R wrist extension, LUE shoulder flexion 120   ?  ? Strength  ? Overall Strength Deficits   ? Overall Strength Comments R shoulder not tested due to recent shoulder surgery, LUE grossly 3-3+/5   ?  ? Hand Function  ? Right Hand Grip (lbs) 27.7   ? Left Hand Grip (lbs) 13.8   ? ?  ?  ? ?  ? ? ? ? ? ? ? ? ? ? ? ? ? ? ? ? ? ? ? ? ? OT Short Term Goals - 09/01/21 1548   ? ?  ? OT SHORT TERM GOAL #1  ? Title I with initial HEP   ? Time 4   ? Period Weeks   ? Status New   ?  ? OT SHORT TERM GOAL #2  ? Title Pt will donn shirt consistently with min A.   ? Time 4   ? Period Weeks   ? Status New   ?  ?  OT SHORT TERM GOAL #3  ? Title Pt will complete  bathing with min A.   ? Time 4   ? Period Weeks   ? Status New   ?  ? OT SHORT TERM GOAL #4  ? Title Pt / caregiver will verbalize understanding of adapted strategies to increase independence with ADLs/IADLs and to minimize pain .( sitting for bathing, use of a schedule to assist with initation prn)   ? Time 4   ? Period Weeks   ? Status New   ?  ? OT SHORT TERM GOAL #5  ? Title Pt will increase bilateral grip strength by 5 lbs for increased ease with ADLs/ IADLs.   ? Time 4   ? Period Weeks   ? Status New   ?  ? OT SHORT TERM GOAL #6  ? Title Pt will decreased 9 hole peg test score by 3 secs bilaterally for increased ease with ADLs/IADLs.   ? Time 4   ? Period Weeks   ? Status New   ? ?  ?  ? ?  ? ? ? ? OT Long Term Goals - 09/01/21 1551   ? ?  ? OT LONG TERM GOAL #1  ? Title I with updated HEP.   ? Time 12   ? Period Weeks   ? Status New   ? Target Date 11/24/21   ?  ? OT LONG TERM GOAL #2  ? Title Pt will perfrom all basic ADLs at a modified independent level.   ? Time 12   ? Period Weeks   ? Status New   ?  ? OT LONG TERM GOAL #3  ? Title Pt will demonstrate ability to retrieve a 3 lbs  object at 125* shoulder flexion with LUE  without dropping   ? Time 12   ? Period Weeks   ? Status New   ?  ? OT LONG TERM GOAL #4  ? Title Pt will perform simple cooking task with supervision demonstrating good safety awareness.   ? Time 12   ? Period Weeks   ? Status New   ?  ? OT LONG TERM GOAL #5  ? Title Pt will perform basic home mangement tasks at a mod I level .   ? Time 12   ? Period Weeks   ? Status New   ? ?  ?  ? ?  ? ? ? ? ? ? ? ? Plan - 09/01/21 1605   ? ? Clinical Impression Statement Pt is a 66 y.o male with post concussive syndrome onset 10/10/19 with nausea headaches, balance, mood disturbances,  cognitive slowing and irritability. MRI report of brain was relatively unremarkable for head injury and Cervical myelopathy. Pt s/p C3-4 discectomy with fusion 09/21/20 and R shoulder surgery  for torn tendon and removal  of bone spurs on 06/29/21. Pt is receiving PT for shoulder. Pt presents with the following deficits: cognitive deficits, pain, decreased ROM, decreased strength, decreased balance and functional mobility whic

## 2021-09-06 ENCOUNTER — Ambulatory Visit: Payer: No Typology Code available for payment source | Admitting: Physical Therapy

## 2021-09-06 ENCOUNTER — Ambulatory Visit: Payer: No Typology Code available for payment source | Admitting: Occupational Therapy

## 2021-09-06 ENCOUNTER — Other Ambulatory Visit: Payer: Self-pay

## 2021-09-06 DIAGNOSIS — M6281 Muscle weakness (generalized): Secondary | ICD-10-CM

## 2021-09-06 DIAGNOSIS — R2681 Unsteadiness on feet: Secondary | ICD-10-CM

## 2021-09-06 DIAGNOSIS — G44329 Chronic post-traumatic headache, not intractable: Secondary | ICD-10-CM

## 2021-09-06 DIAGNOSIS — R42 Dizziness and giddiness: Secondary | ICD-10-CM

## 2021-09-06 DIAGNOSIS — M25611 Stiffness of right shoulder, not elsewhere classified: Secondary | ICD-10-CM

## 2021-09-06 DIAGNOSIS — R278 Other lack of coordination: Secondary | ICD-10-CM

## 2021-09-06 DIAGNOSIS — R41841 Cognitive communication deficit: Secondary | ICD-10-CM | POA: Diagnosis not present

## 2021-09-06 DIAGNOSIS — R262 Difficulty in walking, not elsewhere classified: Secondary | ICD-10-CM

## 2021-09-06 DIAGNOSIS — M542 Cervicalgia: Secondary | ICD-10-CM

## 2021-09-06 NOTE — Patient Instructions (Signed)
?  For shirt/jacket:  ? ?Put Rt arm in first, but take out last.  ?For pullover shirt: put Rt arm in and slide up above elbow, then same with Lt arm and slide above elbow, gather at neck and then pull over head, and pull down shirt in back and front. Reverse steps to take off (pull off head, then Lt arm, then Rt arm)  ? ? ?Putty Ex's: Use yellow putty for Lt hand, red/pink putty for Rt hand ? ?1. Grip Strengthening (Resistive Putty) ? ? ?Squeeze putty using thumb and all fingers. ?Repeat _20___ times. Do __2__ sessions per day. Repeat with other hand (different color)  ? ? ?Coordination Activities ? ?Perform the following activities for 15 minutes 1-2 times per day with both hand(s). ? ?Rotate ball in fingertips (clockwise and counter-clockwise). ?Flip cards 1 at a time as fast as you can. ?Deal cards with your thumb (Hold deck in hand and push card off top with thumb). ?Rotate one card in hand (clockwise and counter-clockwise). ?Pick up coins and place in container or coin bank. ?Pick up coins one at a time until you get 5 in your hand, then move coins from palm to fingertips to stack one at a time. ?Practice writing w/ Rt hand only, and typing with both hands. ?Screw together nuts and bolts, then unfasten. ? ?
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
## 2021-09-06 NOTE — Therapy (Signed)
[facility], [place], [address] ?Phone: [phone]   Fax:  [phone] ? ?Occupational Therapy Treatment ? ?Patient Details  ?Name: Craig Hart ?MRN: [id number] ?Date of Birth: 13-Feb-1956 ?Referring Provider (OT): Dr. Naaman Plummer ? ? ?Encounter Date: 09/06/2021 ? ? OT End of Session - 09/06/21 1420   ? ? Visit Number 2   ? Number of Visits 25   ? Date for OT Re-Evaluation 11/24/21   ? Authorization Type Worker's comp, 12 visits approved, state BCBS   ? Authorization - Visit Number 2   ? Authorization - Number of Visits 12   ? OT Start Time 1315   ? OT Stop Time 1400   ? OT Time Calculation (min) 45 min   ? Activity Tolerance Patient limited by pain   ? Behavior During Therapy Anxious;Flat affect   ? ?  ?  ? ?  ? ? ?Past Medical History:  ?Diagnosis Date  ? Hypertension   ? Hypertensive emergency 09/21/2014  ? ? ?Past Surgical History:  ?Procedure Laterality Date  ? LEFT HEART CATHETERIZATION WITH CORONARY ANGIOGRAM N/A 09/21/2014  ? Procedure: LEFT HEART CATHETERIZATION WITH CORONARY ANGIOGRAM;  Surgeon: Sherren Mocha, MD;  Location: [facility] CATH LAB;  Service: Cardiovascular:  minimal LAD disease.  20-30% mid RCA disease with a right dominant system.  EF 65-70%. --False activation of ST elevation MI.  ? SHOULDER SURGERY Right 06/29/2021  ? TONSILLECTOMY    ? ? ?There were no vitals filed for this visit. ? ? Subjective Assessment - 09/06/21 1318   ? ? Subjective  I just got over a cold. I go by "[name]"   ? 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 MRI report of brain was relatively unremarkable for head injury Cervical myelopathy,  09/21/20 to C3-4 discectomy with fusion , R shoulder surgery 06/29/21  - for torn tendon and removal of bone spurs on 06/29/21- receiving PT for R shoulder   ? Limitations Goes by "[name]"    ? Patient Stated Goals increase independence with ADLs/IADLS   ? Currently in Pain? Yes   ? Pain Score 8    ? Pain Location Shoulder   and neck  ? Pain Orientation Right   ? Pain Descriptors / Indicators Sharp   ? Pain Type Acute pain   ? Pain Onset More than a month ago   ? Pain Frequency Intermittent   ? Aggravating Factors  positioning, use   ? Pain Relieving Factors meds, cold pack   ? ?  ?  ? ?  ? ? ?Practiced donning/doffing jacket I'ly after cueing for strategies. Pt practiced donning/doffing pull over shirt w/ min assist to pull down in back (d/t t-shirt on underneath) and cues for strategies - see pt instructions for details.  ? ?Pt issued yellow putty for Lt hand and red putty for Rt hand for grip strength exercise - see pt instructions.  ? ?Coordination ex's issued for both hands - see pt instructions ? ? ? ? ? ? ? ? ? ? ? ? ? ? ? ? ? ? ? ? OT Education - 09/06/21 1338   ? ? Education Details strategies for donning jacket/shirt, putty HEP, coordination HEP   ? Person(s) Educated Patient   ? Methods Explanation;Demonstration;Handout;Verbal cues   ? Comprehension Verbalized understanding;Returned demonstration;Verbal cues required   ? ?  ?  ? ?  ? ? ?  OT Short Term Goals - 09/06/21 1421   ? ?  ? OT SHORT TERM GOAL #1  ? Title I with initial HEP   ? Time 4   ? Period Weeks   ? Status On-going   ? Target Date 09/29/21   ?  ? OT SHORT TERM GOAL #2  ? Title Pt will donn shirt consistently with min A.   ? Time 4   ? Period Weeks   ? Status On-going   ?  ? OT SHORT TERM GOAL #3  ? Title Pt will complete bathing with min A.   ? Time 4   ? Period Weeks   ? Status New   ?  ? OT SHORT TERM GOAL #4  ? Title Pt / caregiver will verbalize understanding of adapted strategies to increase independence with ADLs/IADLs and to minimize pain.( sitting for bathing, use of a schedule to assist with initation prn)   ? Time 4   ? Period Weeks   ? Status New   ?  ? OT SHORT TERM GOAL #5  ? Title Pt will increase bilateral grip  strength by 5 lbs for increased ease with ADLs/ IADLs.   ? Time 4   ? Period Weeks   ? Status New   ?  ? OT SHORT TERM GOAL #6  ? Title Pt will decrease 9 hole peg test score by 3 secs bilaterally for increased ease with ADLs/IADLs.   ? Time 4   ? Period Weeks   ? Status New   ? ?  ?  ? ?  ? ? ? ? OT Long Term Goals - 09/01/21 1551   ? ?  ? OT LONG TERM GOAL #1  ? Title I with updated HEP.   ? Time 12   ? Period Weeks   ? Status New   ? Target Date 11/24/21   ?  ? OT LONG TERM GOAL #2  ? Title Pt will perfrom all basic ADLs at a modified independent level.   ? Time 12   ? Period Weeks   ? Status New   ?  ? OT LONG TERM GOAL #3  ? Title Pt will demonstrate ability to retrieve a 3 lbs  object at 125* shoulder flexion with LUE  without dropping   ? Time 12   ? Period Weeks   ? Status New   ?  ? OT LONG TERM GOAL #4  ? Title Pt will perform simple cooking task with supervision demonstrating good safety awareness.   ? Time 12   ? Period Weeks   ? Status New   ?  ? OT LONG TERM GOAL #5  ? Title Pt will perform basic home mangement tasks at a mod I level .   ? Time 12   ? Period Weeks   ? Status New   ? ?  ?  ? ?  ? ? ? ? ? ? ? ? Plan - 09/06/21 1421   ? ? Clinical Impression Statement Pt progressing towards STG's #1 and #2. Pt with decreased processing speed when following directions and for motor planning   ? OT Occupational Profile and History Detailed Assessment- Review of Records and additional review of physical, cognitive, psychosocial history related to current functional performance   ? Occupational performance deficits (Please refer to evaluation for details): ADL's;IADL's;Rest and Sleep;Work;Leisure;Social Participation   ? Body Structure / Function / Physical Skills ADL;Balance;Endurance;Mobility;Strength;Flexibility;UE functional use;FMC;Pain;Gait;Coordination;ROM;GMC;Decreased knowledge of precautions;Decreased knowledge of  use of DME;Dexterity;IADL;Vision   ? Cognitive Skills  Attention;Energy/Drive;Learn;Memory;Problem Solve;Safety Awareness;Temperament/Personality;Thought;Understand   ? Rehab Potential Good   ? Clinical Decision Making Limited treatment options, no task modification necessary   ? Comorbidities Affecting Occupational Performance: May have comorbidities impacting occupational performance   ? Modification or Assistance to Complete Evaluation  Min-Moderate modification of tasks or assist with assess necessary to complete eval   ? OT Frequency 2x / week   plus eval  ? OT Duration 12 weeks   ? OT Treatment/Interventions Self-care/ADL training;Therapeutic exercise;Functional Mobility Training;Balance training;Splinting;Manual Therapy;Neuromuscular education;Ultrasound;Aquatic Therapy;Therapeutic activities;Cognitive remediation/compensation;DME and/or AE instruction;Paraffin;Cryotherapy;Electrical Stimulation;Visual/perceptual remediation/compensation;Fluidtherapy;Moist Heat;Passive range of motion;Patient/family education;Energy conservation   ? Plan reinforce ADL strategies for donning/doffing shirt and jacket, review putty and coordination HEP prn, continue progress towards goals   ? Recommended Other Services PT is addressing RUE shoulder s/p surgery, OT will address shoulder only in a functional context   ? Consulted and Agree with Plan of Care Patient;Family member/caregiver   ? ?  ?  ? ?  ? ? ?Patient will benefit from skilled therapeutic intervention in order to improve the following deficits and impairments:   ?Body Structure / Function / Physical Skills: ADL, Balance, Endurance, Mobility, Strength, Flexibility, UE functional use, FMC, Pain, Gait, Coordination, ROM, GMC, Decreased knowledge of precautions, Decreased knowledge of use of DME, Dexterity, IADL, Vision ?Cognitive Skills: Attention, Energy/Drive, Learn, Memory, Problem Solve, Safety Awareness, Temperament/Personality, Thought, Understand ?  ? ? ?Visit Diagnosis: ?Other lack of coordination ? ?Muscle  weakness (generalized) ? ?Stiffness of right shoulder, not elsewhere classified ? ? ? ?Problem List ?Patient Active Problem List  ? Diagnosis Date Noted  ? Dizziness and giddiness 02/03/2021  ? Cervical spondylosis without myelopathy 11/18/202

## 2021-09-07 ENCOUNTER — Ambulatory Visit: Payer: No Typology Code available for payment source | Admitting: Occupational Therapy

## 2021-09-07 ENCOUNTER — Ambulatory Visit: Payer: No Typology Code available for payment source | Admitting: Speech Pathology

## 2021-09-07 ENCOUNTER — Encounter: Payer: Self-pay | Admitting: Occupational Therapy

## 2021-09-07 ENCOUNTER — Encounter: Payer: Self-pay | Admitting: Speech Pathology

## 2021-09-07 DIAGNOSIS — R278 Other lack of coordination: Secondary | ICD-10-CM

## 2021-09-07 DIAGNOSIS — R41844 Frontal lobe and executive function deficit: Secondary | ICD-10-CM

## 2021-09-07 DIAGNOSIS — R2681 Unsteadiness on feet: Secondary | ICD-10-CM

## 2021-09-07 DIAGNOSIS — R41841 Cognitive communication deficit: Secondary | ICD-10-CM | POA: Diagnosis not present

## 2021-09-07 DIAGNOSIS — M6281 Muscle weakness (generalized): Secondary | ICD-10-CM

## 2021-09-07 DIAGNOSIS — M25611 Stiffness of right shoulder, not elsewhere classified: Secondary | ICD-10-CM

## 2021-09-07 DIAGNOSIS — R42 Dizziness and giddiness: Secondary | ICD-10-CM

## 2021-09-07 NOTE — Therapy (Signed)
[facility] ?[address] ?[place], [place], [address] ?Phone: [phone]   Fax:  [phone] ? ?Occupational Therapy Treatment ? ?Patient Details  ?Name: Craig Hart ?MRN: [id number] ?Date of Birth: 09-08-1955 ?Referring Provider (OT): Dr. Riley Kill ? ? ?Encounter Date: 09/07/2021 ? ? OT End of Session - 09/07/21 1338   ? ? Visit Number 3   ? Number of Visits 25   ? Date for OT Re-Evaluation 11/24/21   ? Authorization Type Worker's comp, 12 visits approved, state BCBS   ? Authorization - Visit Number 3   ? Authorization - Number of Visits 12   ? OT Start Time 1230   ? OT Stop Time 1315   ? OT Time Calculation (min) 45 min   ? Activity Tolerance Other (comment)   nausea with eye movement  ? Behavior During Therapy [facility] for tasks assessed/performed   ? ?  ?  ? ?  ? ? ?Past Medical History:  ?Diagnosis Date  ? Hypertension   ? Hypertensive emergency 09/21/2014  ? ? ?Past Surgical History:  ?Procedure Laterality Date  ? LEFT HEART CATHETERIZATION WITH CORONARY ANGIOGRAM N/A 09/21/2014  ? Procedure: LEFT HEART CATHETERIZATION WITH CORONARY ANGIOGRAM;  Surgeon: Tonny Bollman, MD;  Location: [facility] CATH LAB;  Service: Cardiovascular:  minimal LAD disease.  20-30% mid RCA disease with a right dominant system.  EF 65-70%. --False activation of ST elevation MI.  ? SHOULDER SURGERY Right 06/29/2021  ? TONSILLECTOMY    ? ? ?There were no vitals filed for this visit. ? ? Subjective Assessment - 09/07/21 1324   ? ? Subjective  Patient reports nausea at level 4, and headache level 4 at start of session.   ? 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 MRI report of brain was relatively unremarkable for head injury Cervical myelopathy,  09/21/20 to C3-4 discectomy with fusion , R shoulder surgery 06/29/21  - for torn tendon and removal of bone spurs on  06/29/21- receiving PT for R shoulder   ? Limitations Goes by "[name]"   ? Patient Stated Goals increase independence with ADLs/IADLS   ? Currently in Pain? Yes   ? Pain Score 4    ? Pain Location Head   ? Pain Orientation Anterior   ? Pain Descriptors / Indicators Headache   ? Pain Type Chronic pain   ? Pain Onset More than a month ago   ? Pain Frequency Intermittent   ? Aggravating Factors  bright light, looking up and left   ? Pain Relieving Factors cold pack, rest   ? ?  ?  ? ?  ? ? ? ? ? ? ? ? ? ? ? ? ? ? ? OT Treatments/Exercises (OP) - 09/07/21 0001   ? ?  ? ADLs  ? Bathing Patient indicates that he is still not bathing himself.  Wife not present for OT session.  Patient says there are areas hard to reach.  "I don't like to struggle."  Talked overtly about the importance of reducing wife's involvement in basic self care skills, and increasing his independnece.   ? ADL Comments Patient reports napping frequently during the day, and not sleeping well at night.  Talked about the improtance of reducing day time napping to allow him to sleep better at night.  Issued patient a sleep diary so he could begin to have quantitative information regarding daytime  sleeping and track progress.  Patient reports recently receiving medication to address depression and sleep.   ?  ? Visual/Perceptual Exercises  ? Other Exercises In quiet room with lights dimmed, worked on [employer] and saccadic activity.  Patient with increase in nausea - consistently with eye motion up and to left.  Patient does not tolerate vertical eye movement above level of eyebrow without increase in nausea symptoms.  Saccadic movement quite slow due to limited tolerance to left and upward.   ? ?  ?  ? ?  ? ? ? ? ? ? ? ? ? OT Education - 09/07/21 1337   ? ? Education Details issued sleep diary to gather objective data regarding daytime sleep   ? Person(s) Educated Patient   ? Methods Explanation;Handout   ? Comprehension Need further instruction   ? ?   ?  ? ?  ? ? ? OT Short Term Goals - 09/07/21 1348   ? ?  ? OT SHORT TERM GOAL #1  ? Title I with initial HEP   ? Time 4   ? Period Weeks   ? Status On-going   ? Target Date 09/29/21   ?  ? OT SHORT TERM GOAL #2  ? Title Pt will donn shirt consistently with min A.   ? Time 4   ? Period Weeks   ? Status On-going   ?  ? OT SHORT TERM GOAL #3  ? Title Pt will complete bathing with min A.   ? Time 4   ? Period Weeks   ? Status On-going   ?  ? OT SHORT TERM GOAL #4  ? Title Pt / caregiver will verbalize understanding of adapted strategies to increase independence with ADLs/IADLs and to minimize pain.( sitting for bathing, use of a schedule to assist with initation prn)   ? Time 4   ? Period Weeks   ? Status On-going   ?  ? OT SHORT TERM GOAL #5  ? Title Pt will increase bilateral grip strength by 5 lbs for increased ease with ADLs/ IADLs.   ? Time 4   ? Period Weeks   ? Status On-going   ?  ? OT SHORT TERM GOAL #6  ? Title Pt will decrease 9 hole peg test score by 3 secs bilaterally for increased ease with ADLs/IADLs.   ? Time 4   ? Period Weeks   ? Status On-going   ? ?  ?  ? ?  ? ? ? ? OT Long Term Goals - 09/07/21 1348   ? ?  ? OT LONG TERM GOAL #1  ? Title I with updated HEP.   ? Time 12   ? Period Weeks   ? Status On-going   ? Target Date 11/24/21   ?  ? OT LONG TERM GOAL #2  ? Title Pt will perform all basic ADLs at a modified independent level.   ? Time 12   ? Period Weeks   ? Status On-going   ?  ? OT LONG TERM GOAL #3  ? Title Pt will demonstrate ability to retrieve a 3 lbs  object at 125* shoulder flexion with LUE  without dropping   ? Time 12   ? Period Weeks   ? Status On-going   ?  ? OT LONG TERM GOAL #4  ? Title Pt will perform simple cooking task with supervision demonstrating good safety awareness.   ? Time 12   ? Period  Weeks   ? Status On-going   ?  ? OT LONG TERM GOAL #5  ? Title Pt will perform basic home mangement tasks at a mod I level .   ? Time 12   ? Period Weeks   ? Status On-going   ? ?  ?  ? ?   ? ? ? ? ? ? ? ? Plan - 09/07/21 1338   ? ? Clinical Impression Statement Patient's oculomotor impairments may be contribuitng to his delayed processing.  Patient with increased nausea complaint with all eye movements superiorly and to left.   ? OT Occupational Profile and History Detailed Assessment- Review of Records and additional review of physical, cognitive, psychosocial history related to current functional performance   ? Occupational performance deficits (Please refer to evaluation for details): ADL's;IADL's;Rest and Sleep;Work;Leisure;Social Participation   ? Body Structure / Function / Physical Skills ADL;Balance;Endurance;Mobility;Strength;Flexibility;UE functional use;FMC;Pain;Gait;Coordination;ROM;GMC;Decreased knowledge of precautions;Decreased knowledge of use of DME;Dexterity;IADL;Vision   ? Cognitive Skills Attention;Energy/Drive;Learn;Memory;Problem Solve;Safety Awareness;Temperament/Personality;Thought;Understand   ? Rehab Potential Good   ? Clinical Decision Making Limited treatment options, no task modification necessary   ? Comorbidities Affecting Occupational Performance: May have comorbidities impacting occupational performance   ? Modification or Assistance to Complete Evaluation  Min-Moderate modification of tasks or assist with assess necessary to complete eval   ? OT Frequency 2x / week   ? OT Duration 12 weeks   ? OT Treatment/Interventions Self-care/ADL training;Therapeutic exercise;Functional Mobility Training;Balance training;Splinting;Manual Therapy;Neuromuscular education;Ultrasound;Aquatic Therapy;Therapeutic activities;Cognitive remediation/compensation;DME and/or AE instruction;Paraffin;Cryotherapy;Electrical Stimulation;Visual/perceptual remediation/compensation;Fluidtherapy;Moist Heat;Passive range of motion;Patient/family education;Energy conservation   ? Plan Ask about sleep diary - he may have it in his speech book in separate folder, could benefit from further vision  testing (monocular to binocular)  pursuits, saccades - very limited in superiro fields, hand strengthening, ADL - Needs increased participation in all aspects   ? ?  ?  ? ?  ? ? ?Patient will benefit from

## 2021-09-07 NOTE — Patient Instructions (Signed)
? ?  Continue to use your calendar to keep up with your appointments without having to rely on [name] to remind you ? ?Keep up with to do list, again so [name] doesn't have to remind you ? ?HW work sheet is last priority ? ?You may need sections for OT and PT in your binders, so we may have to clean out some old ST work to make room ? ? ?

## 2021-09-07 NOTE — Therapy (Signed)
[facility], [place], [address] ?Phone: [phone]   Fax:  [phone] ? ?Speech Language Pathology Treatment ? ?Patient Details  ?Name: Craig Hart ?MRN: [id number] ?Date of Birth: Aug 28, 1955 ?Referring Provider (SLP): Dr. Alger Hart ? ? ?Encounter Date: 09/07/2021 ? ? End of Session - 09/07/21 1446   ? ? Visit Number 31   ? Number of Visits 36   ? Date for SLP Re-Evaluation 10/27/21   ? Authorization Type WC - 36   ? Authorization - Visit Number 31   ? Authorization - Number of Visits 36   ? SLP Start Time 1400   ? SLP Stop Time  1443   ? SLP Time Calculation (min) 43 min   ? Activity Tolerance Patient tolerated treatment well   ? ?  ?  ? ?  ? ? ?Past Medical History:  ?Diagnosis Date  ? Hypertension   ? Hypertensive emergency 09/21/2014  ? ? ?Past Surgical History:  ?Procedure Laterality Date  ? LEFT HEART CATHETERIZATION WITH CORONARY ANGIOGRAM N/A 09/21/2014  ? Procedure: LEFT HEART CATHETERIZATION WITH CORONARY ANGIOGRAM;  Surgeon: Craig Mocha, MD;  Location: [facility] CATH LAB;  Service: Cardiovascular:  minimal LAD disease.  20-30% mid RCA disease with a right dominant system.  EF 65-70%. --False activation of ST elevation MI.  ? SHOULDER SURGERY Right 06/29/2021  ? TONSILLECTOMY    ? ? ?There were no vitals filed for this visit. ? ? Subjective Assessment - 09/07/21 1405   ? ? Subjective "She gave me the sleep log"   ? Currently in Pain? Yes   ? Pain Score 4    ? Pain Location Head   ? Pain Orientation Anterior   ? Pain Descriptors / Indicators Headache   ? Pain Type Chronic pain   ? Pain Onset More than a month ago   ? Aggravating Factors  light, scanning   ? ?  ?  ? ?  ? ? ? ? ? ? ? ? ADULT SLP TREATMENT - 09/07/21 1410   ? ?  ? General Information  ? Behavior/Cognition Alert;Cooperative;Pleasant mood   ?  ? Treatment Provided  ? Treatment provided Cognitive-Linquistic   ?  ? Cognitive-Linquistic Treatment  ? Treatment  focused on Cognition;Patient/family/caregiver education   ? Skilled Treatment Craig Hart has started Vestibular and OT. Targeted word finding and spelling in complex naming (write word) level - Pt named 12 items with given letter occasional min A, spelling errors 4/12 (improvement) which required A to ID and pt spontaneously used phone to correct errors. Alternating attention and error awareness targeted in mildly complex deduction puzzle, required 4 cues to ID errors and alternate attention from conversation to task   ?  ? Assessment / Recommendations / Plan  ? Plan Continue with current plan of care   ?  ? Progression Toward Goals  ? Progression toward goals Progressing toward goals   ? ?  ?  ? ?  ? ? ? SLP Education - 09/07/21 1447   ? ? Education Details continue filling in calendar and using to do list, HW provided   ? Person(s) Educated Patient   ? Methods Explanation;Verbal cues;Handout   ? Comprehension Verbalized understanding;Verbal cues required   ? ?  ?  ? ?  ? ? ? SLP Short Term Goals - 09/07/21 1448   ? ?  ? SLP SHORT TERM GOAL #1  ? Title Will complete CLQT and modify goals as needed   ?  Status Achieved   ?  ? SLP SHORT TERM GOAL #2  ? Title Pt will keep calendar with appointments and daily schedule to complete 1 household task a day and 1 cognitive task a day for 10-15 minutes.   ? Status Achieved   ?  ? SLP SHORT TERM GOAL #3  ? Title Pt  will carryover 2 strategies for processing and recalling information from healthcare providers with occasional min A over 2 sessions   ? Status Partially Met   ?  ? SLP SHORT TERM GOAL #4  ? Title Craig Hart will attend to simple cognitive linguistic task for 15 minutes with 3 or less redirections over 3 sessions   ? Baseline 04-18-21, 04-22-21; 04-25-21   ? Status Achieved   ?  ? SLP SHORT TERM GOAL #5  ? Title Pt will use pill organizer, alarm if needed or other compensation to manage his medication with no misses/mistakes over 1 week   ? Status Achieved   ? ?  ?  ? ?   ? ? ? SLP Long Term Goals - 09/07/21 1448   ? ?  ? SLP LONG TERM GOAL #1  ? Title Pt will manage his schedule and time to be ready for appointtments and completed 2 chores a day and 2 cognitive activities for 15 minutes   ? Baseline 05/09/21, 05/30/21   ? Status Achieved   ?  ? SLP LONG TERM GOAL #2  ? Title Pt/family will carryover 3 compensations for pt to participate in  4 turns in a conversation or 4 instructions without need for repeptition   ? Status Achieved   ?  ? SLP LONG TERM GOAL #3  ? Title Pt will plan and carrout a visit with 1 friend/extended family member on 3 occasions for 20 minute visits with rare min A from sposue   ? Time 4   ? Period Weeks   ? Status Deferred   ongoing for recert  ?  ? SLP LONG TERM GOAL #4  ? Title Pt will alternate attention on cognitive linguistic task for 20 minutes with 3 or less redirections   ? Baseline 06-23-21,   ? Time 9   ? Period Weeks   ? Status On-going   ongoing for recert  ?  ? SLP LONG TERM GOAL #5  ? Title [name] will improve score on Neuro-QOL Cognitive Function short form by 3 points   ? Baseline Craig Hart - 16; Craig Hart - 13   ? Time 1   ? Period Weeks   ? Status Achieved   ongoing for recert  ?  ? SLP LONG TERM GOAL #6  ? Title Pt will initiate 2 episodes where he requests help from spouse to complete medical or insurance paperwork with use of to do list or calendar   ? Time 9   ? Period Weeks   ? Status On-going   ?  ? SLP LONG TERM GOAL #7  ? Title Craig Hart will complete all HEP's with 3 or less reminders over 1 week using calendar or to do   ? Time 9   ? Period Weeks   ? Status On-going   ? ?  ?  ? ?  ? ? ? Plan - 09/07/21 1447   ? ? Clinical Impression Statement Mild  cognitive communciation impairment persists with reduced error awareness, initiation requesting help in novel or high level tasks and executive function for weekly and monthly planning.  Craig Hart is  keeping apppointments for medical however he requires cues to keep all commitments in his  calendar to reduce burden on spouse who continues to have to remind him. They are to implement white board to do list and Craig Hart is to be intentional about checking weather before dressing. Continue skilled ST to maximize cognition to reduce caregiver burden and  return pt to PLOF   ? Speech Therapy Frequency 1x /week   ? Duration --   24 weeks  ? Treatment/Interventions Compensatory strategies;Patient/family education;Functional tasks;Cueing hierarchy;Multimodal communcation approach;Cognitive reorganization;Environmental controls;SLP instruction and feedback;Internal/external aids;Compensatory techniques;Language facilitation   ? Potential to Achieve Goals Good   ? ?  ?  ? ?  ? ? ?Patient will benefit from skilled therapeutic intervention in order to improve the following deficits and impairments:   ?Cognitive communication deficit ? ? ? ?Problem List ?Patient Active Problem List  ? Diagnosis Date Noted  ? Dizziness and giddiness 02/03/2021  ? Cervical spondylosis without myelopathy 05/06/2020  ? Myalgia 04/22/2020  ? Sleep disturbance 04/05/2020  ? Neck pain 04/05/2020  ? Post concussive syndrome 03/22/2020  ? Nausea without vomiting 03/22/2020  ? Chest pain 08/19/2017  ? Hypertensive urgency 08/19/2017  ? Chest pain with moderate risk for cardiac etiology   ? ST elevation   ? Hypokalemia 09/23/2014  ? Abnormal EKG 09/23/2014  ? Hyperlipidemia 09/23/2014  ? Accelerated hypertension 09/21/2014  ? ? ?Jillana Selph, Annye Rusk, CCC-SLP ?09/07/2021, 2:58 PM ? ?[facility] ?[place], [place], [address] ?Phone: [phone]   Fax:  [phone] ? ? ?Name: ADEEL GUIFFRE ?MRN: [id number] ?Date of Birth: 1955-09-24 ? ?

## 2021-09-08 ENCOUNTER — Other Ambulatory Visit: Payer: Self-pay

## 2021-09-08 ENCOUNTER — Encounter: Payer: Self-pay | Admitting: Psychology

## 2021-09-08 ENCOUNTER — Encounter: Payer: No Typology Code available for payment source | Attending: Psychology | Admitting: Psychology

## 2021-09-08 DIAGNOSIS — G479 Sleep disorder, unspecified: Secondary | ICD-10-CM | POA: Insufficient documentation

## 2021-09-08 DIAGNOSIS — F329 Major depressive disorder, single episode, unspecified: Secondary | ICD-10-CM | POA: Diagnosis not present

## 2021-09-08 DIAGNOSIS — F0781 Postconcussional syndrome: Secondary | ICD-10-CM | POA: Insufficient documentation

## 2021-09-08 DIAGNOSIS — M47812 Spondylosis without myelopathy or radiculopathy, cervical region: Secondary | ICD-10-CM | POA: Insufficient documentation
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
## 2021-09-09 ENCOUNTER — Ambulatory Visit: Payer: No Typology Code available for payment source | Attending: Family Medicine

## 2021-09-09 DIAGNOSIS — M6281 Muscle weakness (generalized): Secondary | ICD-10-CM | POA: Insufficient documentation

## 2021-09-09 DIAGNOSIS — M542 Cervicalgia: Secondary | ICD-10-CM | POA: Diagnosis present

## 2021-09-09 DIAGNOSIS — R2681 Unsteadiness on feet: Secondary | ICD-10-CM | POA: Insufficient documentation

## 2021-09-09 DIAGNOSIS — M25611 Stiffness of right shoulder, not elsewhere classified: Secondary | ICD-10-CM | POA: Insufficient documentation

## 2021-09-09 DIAGNOSIS — R41844 Frontal lobe and executive function deficit: Secondary | ICD-10-CM | POA: Diagnosis present

## 2021-09-09 DIAGNOSIS — R278 Other lack of coordination: Secondary | ICD-10-CM | POA: Insufficient documentation

## 2021-09-09 DIAGNOSIS — R42 Dizziness and giddiness: Secondary | ICD-10-CM | POA: Insufficient documentation
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
## 2021-09-11 DIAGNOSIS — M25611 Stiffness of right shoulder, not elsewhere classified: Secondary | ICD-10-CM | POA: Diagnosis present

## 2021-09-11 DIAGNOSIS — M25612 Stiffness of left shoulder, not elsewhere classified: Secondary | ICD-10-CM | POA: Diagnosis present

## 2021-09-11 DIAGNOSIS — R42 Dizziness and giddiness: Secondary | ICD-10-CM | POA: Diagnosis present

## 2021-09-11 DIAGNOSIS — M25511 Pain in right shoulder: Secondary | ICD-10-CM | POA: Diagnosis present

## 2021-09-11 DIAGNOSIS — M542 Cervicalgia: Secondary | ICD-10-CM | POA: Diagnosis present

## 2021-09-11 DIAGNOSIS — R41844 Frontal lobe and executive function deficit: Secondary | ICD-10-CM | POA: Diagnosis present

## 2021-09-11 DIAGNOSIS — R41840 Attention and concentration deficit: Secondary | ICD-10-CM | POA: Diagnosis present

## 2021-09-11 DIAGNOSIS — R262 Difficulty in walking, not elsewhere classified: Secondary | ICD-10-CM | POA: Diagnosis present

## 2021-09-11 DIAGNOSIS — R41841 Cognitive communication deficit: Secondary | ICD-10-CM | POA: Diagnosis present

## 2021-09-11 DIAGNOSIS — G44329 Chronic post-traumatic headache, not intractable: Secondary | ICD-10-CM | POA: Diagnosis present

## 2021-09-11 DIAGNOSIS — M6281 Muscle weakness (generalized): Secondary | ICD-10-CM | POA: Diagnosis present

## 2021-09-11 DIAGNOSIS — R2681 Unsteadiness on feet: Secondary | ICD-10-CM | POA: Diagnosis present

## 2021-09-11 DIAGNOSIS — R278 Other lack of coordination: Secondary | ICD-10-CM | POA: Diagnosis present

## 2021-09-12 ENCOUNTER — Encounter: Payer: BC Managed Care – PPO | Admitting: Speech Pathology

## 2021-09-12 ENCOUNTER — Other Ambulatory Visit: Payer: Self-pay

## 2021-09-12 ENCOUNTER — Ambulatory Visit: Payer: No Typology Code available for payment source

## 2021-09-12 VITALS — BP 185/104 | HR 90

## 2021-09-12 DIAGNOSIS — M542 Cervicalgia: Secondary | ICD-10-CM

## 2021-09-12 DIAGNOSIS — R42 Dizziness and giddiness: Secondary | ICD-10-CM

## 2021-09-12 DIAGNOSIS — R2681 Unsteadiness on feet: Secondary | ICD-10-CM

## 2021-09-12 DIAGNOSIS — M6281 Muscle weakness (generalized): Secondary | ICD-10-CM
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
## 2021-09-13 ENCOUNTER — Ambulatory Visit: Payer: No Typology Code available for payment source | Admitting: Occupational Therapy

## 2021-09-13 DIAGNOSIS — R41841 Cognitive communication deficit: Secondary | ICD-10-CM | POA: Diagnosis not present

## 2021-09-13 DIAGNOSIS — R41844 Frontal lobe and executive function deficit: Secondary | ICD-10-CM

## 2021-09-13 DIAGNOSIS — M25611 Stiffness of right shoulder, not elsewhere classified: Secondary | ICD-10-CM

## 2021-09-13 DIAGNOSIS — M6281 Muscle weakness (generalized): Secondary | ICD-10-CM

## 2021-09-13 DIAGNOSIS — R278 Other lack of coordination: Secondary | ICD-10-CM

## 2021-09-13 NOTE — Patient Instructions (Addendum)
? ?  Hold a card or target in front of you at a comfortable distance, move the target up and down 5x, follow target with your eyes, keep head still ? ?Take a rest ? ?Focus on target, move target side to side follow target with your eyes 5x, keep head still ? ?Rest ? ?If you are able to, repeat with 1 set of 5 reps for each exercise, after resting  ? ? ? ? ?Please focus on performing lower body dressing by yourself. Sit for putting pants over legs and donning shoes and socks. Use your left arm mainly so that you do not aggravate your right arm but it may be able to help a little. ? ?Remember to bring your sleep log to your next OT visit. ?

## 2021-09-14 ENCOUNTER — Other Ambulatory Visit: Payer: Self-pay

## 2021-09-14 ENCOUNTER — Ambulatory Visit: Payer: No Typology Code available for payment source | Admitting: Speech Pathology

## 2021-09-14 ENCOUNTER — Encounter: Payer: Self-pay | Admitting: Speech Pathology

## 2021-09-14 DIAGNOSIS — R41841 Cognitive communication deficit: Secondary | ICD-10-CM

## 2021-09-14 NOTE — Therapy (Signed)
?Craig Hart ?[place], [place], [address] ?Phone: [phone]   Fax:  [phone] ? ?Speech Language Pathology Treatment ? ?Patient Details  ?Name: Craig Hart ?MRN: [id number] ?Date of Birth: January 04, 1956 ?Referring Provider (SLP): Dr. Alger Hart ? ? ?Encounter Date: 09/14/2021 ? ? End of Session - 09/14/21 1132   ? ? Visit Number 32   ? Number of Visits 36   ? Date for SLP Re-Evaluation 10/27/21   ? Authorization Type WC - 36   ? Authorization - Visit Number 32   ? Authorization - Number of Visits 36   ? SLP Start Time 1015   ? SLP Stop Time  1100   ? SLP Time Calculation (min) 45 min   ? ?  ?  ? ?  ? ? ?Past Medical History:  ?Diagnosis Date  ? Hypertension   ? Hypertensive emergency 09/21/2014  ? ? ?Past Surgical History:  ?Procedure Laterality Date  ? LEFT HEART CATHETERIZATION WITH CORONARY ANGIOGRAM N/A 09/21/2014  ? Procedure: LEFT HEART CATHETERIZATION WITH CORONARY ANGIOGRAM;  Surgeon: Craig Mocha, MD;  Location: [facility] CATH LAB;  Service: Cardiovascular:  minimal LAD disease.  20-30% mid RCA disease with a right dominant system.  EF 65-70%. --False activation of ST elevation MI.  ? SHOULDER SURGERY Right 06/29/2021  ? TONSILLECTOMY    ? ? ?There were no vitals filed for this visit. ? ? Subjective Assessment - 09/14/21 1133   ? ? Subjective "He is anticipating and planning better"   ? Patient is accompained by: Family member   Craig Hart wife  ? Currently in Pain? Yes   ? Pain Score 7    ? Pain Location Shoulder   ? Pain Orientation Anterior   ? Pain Descriptors / Indicators Aching   ? Pain Type Chronic pain   ? ?  ?  ? ?  ? ? ? ? ? ? ? ? ADULT SLP TREATMENT - 09/14/21 1139   ? ?  ? General Information  ? Behavior/Cognition Alert;Cooperative;Pleasant mood   ?  ? Treatment Provided  ? Treatment provided Cognitive-Linquistic   ?  ? Cognitive-Linquistic Treatment  ? Treatment focused on Cognition;Patient/family/caregiver education   ?  Skilled Treatment Craig Hart reports Craig Hart has shown more initation and planning at home, planning meals around her mouth surgery and offering to get ice when ice make was turned off. She is pleased with this. Targeted error awareness, alternating attention and written expression in complex written naming task - Craig Hart consistently required cues to fix errors, however He ID'd errors with occasional min A. He alternated attention between conversation and naming/writing task with 2 re-directions to task over 30 minutes.   ?  ? Assessment / Recommendations / Plan  ? Plan Continue with current plan of care   ?  ? Progression Toward Goals  ? Progression toward goals Progressing toward goals   ? ?  ?  ? ?  ? ? ? ? ? SLP Short Term Goals - 09/14/21 1137   ? ?  ? SLP SHORT TERM GOAL #1  ? Title Will complete CLQT and modify goals as needed   ? Status Achieved   ?  ? SLP SHORT TERM GOAL #2  ? Title Pt will keep calendar with appointments and daily schedule to complete 1 household task a day and 1 cognitive task a day for 10-15 minutes.   ? Status Achieved   ?  ? SLP SHORT TERM GOAL #3  ?  Title Pt  will carryover 2 strategies for processing and recalling information from healthcare providers with occasional min A over 2 sessions   ? Status Partially Met   ?  ? SLP SHORT TERM GOAL #4  ? Title Craig Hart will attend to simple cognitive linguistic task for 15 minutes with 3 or less redirections over 3 sessions   ? Baseline 04-18-21, 04-22-21; 04-25-21   ? Status Achieved   ?  ? SLP SHORT TERM GOAL #5  ? Title Pt will use pill organizer, alarm if needed or other compensation to manage his medication with no misses/mistakes over 1 week   ? Status Achieved   ? ?  ?  ? ?  ? ? ? SLP Long Term Goals - 09/14/21 1137   ? ?  ? SLP LONG TERM GOAL #1  ? Title Pt will manage his schedule and time to be ready for appointtments and completed 2 chores a day and 2 cognitive activities for 15 minutes   ? Baseline 05/09/21, 05/30/21   ? Status  Achieved   ?  ? SLP LONG TERM GOAL #2  ? Title Pt/family will carryover 3 compensations for pt to participate in  4 turns in a conversation or 4 instructions without need for repeptition   ? Status Achieved   ?  ? SLP LONG TERM GOAL #3  ? Title Pt will plan and carrout a visit with 1 friend/extended family member on 3 occasions for 20 minute visits with rare min A from sposue   ? Time 4   ? Period Weeks   ? Status Deferred   ongoing for recert  ?  ? SLP LONG TERM GOAL #4  ? Title Pt will alternate attention on cognitive linguistic task for 20 minutes with 3 or less redirections   ? Baseline 06-23-21, 09/14/21   ? Time 8   ? Period Weeks   ? Status On-going   ongoing for recert  ?  ? SLP LONG TERM GOAL #5  ? Title [name] will improve score on Neuro-QOL Cognitive Function short form by 3 points   ? Baseline Craig Hart - 16; Craig Hart - 13   ? Time 1   ? Period Weeks   ? Status Achieved   ongoing for recert  ?  ? SLP LONG TERM GOAL #6  ? Title Pt will initiate 2 episodes where he requests help from spouse to complete medical or insurance paperwork with use of to do list or calendar   ? Baseline 09/14/21   ? Time 8   ? Period Weeks   ? Status On-going   ?  ? SLP LONG TERM GOAL #7  ? Title Craig Hart will complete all HEP's with 3 or less reminders over 1 week using calendar or to do   ? Baseline 09/14/21   ? Time 8   ? Period Weeks   ? Status On-going   ? ?  ?  ? ?  ? ? ? Plan - 09/14/21 1134   ? ? 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 Craig Hart has improved in planning and intiating at home per spouse, she is pleased. Craig Hart also reports he is feeling better. Neuropsych consult complete - he will begin counseling in May. Sleepiness, error awareness and written expression continue to require mod A. Continue skilled ST to maximize congition for QOL, independence and to reduce caregiver burden.   ? Speech Therapy Frequency 1x /week   ? Duration --  24 weeks  ?  Treatment/Interventions Compensatory strategies;Patient/family education;Functional tasks;Cueing hierarchy;Multimodal communcation approach;Cognitive reorganization;Environmental controls;SLP instruction and feedback;Internal/external aids;Compensatory techniques;Language facilitation   ? Potential to Achieve Goals Good   ? ?  ?  ? ?  ? ? ?Patient will benefit from skilled therapeutic intervention in order to improve the following deficits and impairments:   ?Cognitive communication deficit ? ? ? ?Problem List ?Patient Active Problem List  ? Diagnosis Date Noted  ? Dizziness and giddiness 02/03/2021  ? Cervical spondylosis without myelopathy 05/06/2020  ? Myalgia 04/22/2020  ? Sleep disturbance 04/05/2020  ? Neck pain 04/05/2020  ? Post concussive syndrome 03/22/2020  ? Nausea without vomiting 03/22/2020  ? Chest pain 08/19/2017  ? Hypertensive urgency 08/19/2017  ? Chest pain with moderate risk for cardiac etiology   ? ST elevation   ? Hypokalemia 09/23/2014  ? Abnormal EKG 09/23/2014  ? Hyperlipidemia 09/23/2014  ? Accelerated hypertension 09/21/2014  ? ? ?Adynn Caseres, Annye Rusk, CCC-SLP ?09/14/2021, 11:42 AM ? ?[place] ?[place] ?[place], [place], [address] ?Phone: [phone]   Fax:  [phone] ? ? ?Name: Craig Hart ?MRN: [id number] ?Date of Birth: 22-Feb-1956 ? ?

## 2021-09-14 NOTE — Therapy (Signed)
[facility] ?[address] ?[place], [place], [address] ?Phone: [phone]   Fax:  [phone] ? ?Occupational Therapy Treatment ? ?Patient Details  ?Name: Craig Hart ?MRN: [id number] ?Date of Birth: 03-01-1956 ?Referring Provider (OT): Dr. Riley Kill ? ? ?Encounter Date: 09/13/2021 ? ? OT End of Session - 09/14/21 0737   ? ? Visit Number 4   ? Number of Visits 25   ? Date for OT Re-Evaluation 11/24/21   ? Authorization Type Worker's comp, 12 visits approved, state [employer]   ? Authorization - Visit Number 4   ? Authorization - Number of Visits 12   ? OT Start Time 1322   ? OT Stop Time 1400   ? OT Time Calculation (min) 38 min   ? Activity Tolerance Other (comment)   nausea with eye movement  ? Behavior During Therapy [facility] for tasks assessed/performed   ? ?  ?  ? ?  ? ? ?Past Medical History:  ?Diagnosis Date  ? Hypertension   ? Hypertensive emergency 09/21/2014  ? ? ?Past Surgical History:  ?Procedure Laterality Date  ? LEFT HEART CATHETERIZATION WITH CORONARY ANGIOGRAM N/A 09/21/2014  ? Procedure: LEFT HEART CATHETERIZATION WITH CORONARY ANGIOGRAM;  Surgeon: Tonny Bollman, MD;  Location: [facility] CATH LAB;  Service: Cardiovascular:  minimal LAD disease.  20-30% mid RCA disease with a right dominant system.  EF 65-70%. --False activation of ST elevation MI.  ? SHOULDER SURGERY Right 06/29/2021  ? TONSILLECTOMY    ? ? ?There were no vitals filed for this visit. ? ? Subjective Assessment - 09/13/21 1326   ? ? Subjective  Pt reports R shoulder pain and neck pain   ? 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 MRI report of brain was relatively unremarkable for head injury Cervical myelopathy,  09/21/20 to C3-4 discectomy with fusion , R shoulder surgery 06/29/21  - for torn tendon and removal of bone spurs on 06/29/21- receiving PT for R shoulder    ? Limitations Goes by "[name]"   ? Patient Stated Goals increase independence with ADLs/IADLS   ? Currently in Pain? Yes   ? Pain Score 7    ? Pain Location Shoulder   neck  ? Pain Descriptors / Indicators Aching   ? Pain Type Chronic pain   ? Pain Onset More than a month ago   ? Pain Frequency Intermittent   ? Aggravating Factors  movement   ? Pain Relieving Factors rest , meds   ? ?  ?  ? ?  ? ? ? ? ? ? ? ? ? ? ? ? ? ?Treatment: Pt was treated in a quiet room, with dimmed lights, frequent rest breaks required and increased time for activities.Flipping and dealing playing cards with left and right UE's for increased bilateral functional use/ fine motor coordination, min v.c ? ? ? ? ? ? ? ? ? OT Treatment/Education - 09/14/21 0742   ? ? Education Details simple visual HEP, pt performed 5 reps prior to rest, see pt instructions, pt required rest break for nausea to disapate ?discussion regarding increasing participation in ADLS starting with LB dressing,   ? Person(s) Educated Patient   ? Methods Explanation;Handout;Demonstration;Verbal cues   ? Comprehension Verbalized understanding;Returned demonstration;Verbal cues required   ? ?  ?  ? ?  ? ? ? OT Short Term Goals - 09/13/21 1327   ? ?  ?  OT SHORT TERM GOAL #1  ? Title I with initial HEP   ? Time 4   ? Period Weeks   ? Status On-going   ? Target Date 09/29/21   ?  ? OT SHORT TERM GOAL #2  ? Title Pt will donn shirt consistently with min A.   ? Time 4   ? Period Weeks   ? Status On-going   ?  ? OT SHORT TERM GOAL #3  ? Title Pt will complete bathing with min A.   ? Time 4   ? Period Weeks   ? Status On-going   ?  ? OT SHORT TERM GOAL #4  ? Title Pt / caregiver will verbalize understanding of adapted strategies to increase independence with ADLs/IADLs and to minimize pain.( sitting for bathing, use of a schedule to assist with initation prn)   ? Time 4   ? Period Weeks   ? Status On-going   ?  ? OT SHORT TERM GOAL #5  ? Title Pt will increase bilateral grip strength  by 5 lbs for increased ease with ADLs/ IADLs.   ? Time 4   ? Period Weeks   ? Status On-going   ?  ? OT SHORT TERM GOAL #6  ? Title Pt will decrease 9 hole peg test score by 3 secs bilaterally for increased ease with ADLs/IADLs.   ? Time 4   ? Period Weeks   ? Status On-going   ? ?  ?  ? ?  ? ? ? ? OT Long Term Goals - 09/13/21 1332   ? ?  ? OT LONG TERM GOAL #1  ? Title I with updated HEP.   ? Time 12   ? Period Weeks   ? Status On-going   ? Target Date 11/24/21   ?  ? OT LONG TERM GOAL #2  ? Title Pt will perform all basic ADLs at a modified independent level.   ? Time 12   ? Period Weeks   ? Status On-going   ?  ? OT LONG TERM GOAL #3  ? Title Pt will demonstrate ability to retrieve a 3 lbs  object at 125* shoulder flexion with LUE  without dropping   ? Time 12   ? Period Weeks   ? Status On-going   ?  ? OT LONG TERM GOAL #4  ? Title Pt will perform simple cooking task with supervision demonstrating good safety awareness.   ? Time 12   ? Period Weeks   ? Status On-going   ?  ? OT LONG TERM GOAL #5  ? Title Pt will perform basic home mangement tasks at a mod I level .   ? Time 12   ? Period Weeks   ? Status On-going   ? ?  ?  ? ?  ? ? ? ? ? ? ? ? Plan - 09/14/21 0738   ? ? Clinical Impression Statement Pt reports that MD injected his right shoulder, and that it has been painful. He is not performing UB dressing for pullover shirts as it aggravates shoulder. Therapist asked pt to start performing all of his own LB dressing as it should not aggravate shoulder since if is low range. Therapist issued simple eye exercises today in quiet environment.   ? OT Occupational Profile and History Detailed Assessment- Review of Records and additional review of physical, cognitive, psychosocial history related to current functional performance   ? Occupational performance deficits (Please  refer to evaluation for details): ADL's;IADL's;Rest and Sleep;Work;Leisure;Social Participation   ? Body Structure / Function / Physical  Skills ADL;Balance;Endurance;Mobility;Strength;Flexibility;UE functional use;FMC;Pain;Gait;Coordination;ROM;GMC;Decreased knowledge of precautions;Decreased knowledge of use of DME;Dexterity;IADL;Vision   ? Cognitive Skills Attention;Energy/Drive;Learn;Memory;Problem Solve;Safety Awareness;Temperament/Personality;Thought;Understand   ? Rehab Potential Good   ? Clinical Decision Making Limited treatment options, no task modification necessary   ? Comorbidities Affecting Occupational Performance: May have comorbidities impacting occupational performance   ? Modification or Assistance to Complete Evaluation  Min-Moderate modification of tasks or assist with assess necessary to complete eval   ? OT Frequency 2x / week   ? OT Duration 12 weeks   ? OT Treatment/Interventions Self-care/ADL training;Therapeutic exercise;Functional Mobility Training;Balance training;Splinting;Manual Therapy;Neuromuscular education;Ultrasound;Aquatic Therapy;Therapeutic activities;Cognitive remediation/compensation;DME and/or AE instruction;Paraffin;Cryotherapy;Electrical Stimulation;Visual/perceptual remediation/compensation;Fluidtherapy;Moist Heat;Passive range of motion;Patient/family education;Energy conservation   ? Plan Ask about sleep diary, check on eye exercises and progress prn - consider further vision testing (monocular to binocular)  pursuits, saccades - very limited in superior fields, hand strengthening, ADL -   ? Consulted and Agree with Plan of Care Patient   ? ?  ?  ? ?  ? ? ?Patient will benefit from skilled therapeutic intervention in order to improve the following deficits and impairments:   ?Body Structure / Function / Physical Skills: ADL, Balance, Endurance, Mobility, Strength, Flexibility, UE functional use, FMC, Pain, Gait, Coordination, ROM, GMC, Decreased knowledge of precautions, Decreased knowledge of use of DME, Dexterity, IADL, Vision ?Cognitive Skills: Attention, Energy/Drive, Learn, Memory, Problem Solve,  Safety Awareness, Temperament/Personality, Thought, Understand ?  ? ? ?Visit Diagnosis: ?Muscle weakness (generalized) ? ?Other lack of coordination ? ?Stiffness of right shoulder, not elsewhere classified ?

## 2021-09-15 ENCOUNTER — Ambulatory Visit: Payer: No Typology Code available for payment source | Admitting: Occupational Therapy

## 2021-09-15 ENCOUNTER — Encounter: Payer: Self-pay | Admitting: Occupational Therapy

## 2021-09-15 DIAGNOSIS — R278 Other lack of coordination: Secondary | ICD-10-CM

## 2021-09-15 DIAGNOSIS — R41844 Frontal lobe and executive function deficit: Secondary | ICD-10-CM

## 2021-09-15 DIAGNOSIS — R2681 Unsteadiness on feet: Secondary | ICD-10-CM

## 2021-09-15 DIAGNOSIS — M6281 Muscle weakness (generalized): Secondary | ICD-10-CM

## 2021-09-15 DIAGNOSIS — M25611 Stiffness of right shoulder, not elsewhere classified: Secondary | ICD-10-CM

## 2021-09-15 NOTE — Therapy (Signed)
[facility] ?[address] ?[place], [place], [address] ?Phone: [phone]   Fax:  [phone] ? ?Occupational Therapy Treatment ? ?Patient Details  ?Name: Craig Hart ?MRN: [id number] ?Date of Birth: 1955/10/03 ?Referring Provider (OT): Dr. Riley Kill ? ? ?Encounter Date: 09/15/2021 ? ? OT End of Session - 09/15/21 1628   ? ? Visit Number 5   ? Number of Visits 25   ? Date for OT Re-Evaluation 11/24/21   ? Authorization Type Worker's comp, 12 visits approved, state [employer]   ? Authorization - Visit Number 5   ? Authorization - Number of Visits 12   ? OT Start Time 1317   ? OT Stop Time 1400   ? OT Time Calculation (min) 43 min   ? Behavior During Therapy [facility] for tasks assessed/performed   ? ?  ?  ? ?  ? ? ?Past Medical History:  ?Diagnosis Date  ? Hypertension   ? Hypertensive emergency 09/21/2014  ? ? ?Past Surgical History:  ?Procedure Laterality Date  ? LEFT HEART CATHETERIZATION WITH CORONARY ANGIOGRAM N/A 09/21/2014  ? Procedure: LEFT HEART CATHETERIZATION WITH CORONARY ANGIOGRAM;  Surgeon: Tonny Bollman, MD;  [facility];  Service: Cardiovascular:  minimal LAD disease.  20-30% mid RCA disease with a right dominant system.  EF 65-70%. --False activation of ST elevation MI.  ? SHOULDER SURGERY Right 06/29/2021  ? TONSILLECTOMY    ? ? ?There were no vitals filed for this visit. ? ? Subjective Assessment - 09/15/21 1606   ? ? Subjective  Patient reports he did not try to dress his lower body as he expects it will be painful.   ? 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 MRI report of brain was relatively unremarkable for head injury Cervical myelopathy,  09/21/20 to C3-4 discectomy with fusion , R shoulder surgery 06/29/21  - for torn tendon and removal of bone spurs on 06/29/21- receiving PT for R shoulder   ? Limitations  Goes by "[name]"   ? Patient Stated Goals increase independence with ADLs/IADLS   ? Currently in Pain? Yes   ? Pain Score 8    ? Pain Location Shoulder   ? Pain Orientation Right   ? Pain Descriptors / Indicators Aching   ? Pain Type Chronic pain   ? Pain Onset More than a month ago   ? Pain Frequency Intermittent   ? Aggravating Factors  movement   ? Pain Relieving Factors rest, meds   ? ?  ?  ? ?  ? ? ? ? ? ? ? ? ? ? ? ? ? ? ? OT Treatments/Exercises (OP) - 09/15/21 0001   ? ?  ? ADLs  ? LB Dressing Patient indicates that he did not work toward lower body dressing as he anticiapted it would be painful.  Patient's wife present for this session.  She indicated that she agrees that patient may have pain with dressing tasks.  Patient's wife also reports fear of him falling forward or setting off vertigo.  Worked incrementally in sitting and in standing to reach downward toward feet.  Patient without any report of dizziness or shoulder pain with these movements.  Patient able to reach to floor to untie shoe.  Then able to remove and replace shoe without difficulty.  Mild shoulder pain reported with tying his shoe - cross legged seated style.   ?  ADL Comments Patient did not bring sleep diary notes.  Spoke with wife who had not seen this at home.  Wife reports that patient is awaiting a sleep study to help determine sleep problems with overall goal of increasing night sleep and decreasing daytime sleep.   ?  ? Visual/Perceptual Exercises  ? Other Exercises Continued work with pursuits and saccades.  Patient with improved tolerance of upward eye motion and left motion.  Able to recover after 3 seconds.   ? ?  ?  ? ?  ? ? ? ? ? ? ? ? ? ? ? OT Short Term Goals - 09/15/21 1630   ? ?  ? OT SHORT TERM GOAL #1  ? Title I with initial HEP   ? Time 4   ? Period Weeks   ? Status On-going   ? Target Date 09/29/21   ?  ? OT SHORT TERM GOAL #2  ? Title Pt will donn shirt consistently with min A.   ? Time 4   ? Period Weeks   ? Status  On-going   ?  ? OT SHORT TERM GOAL #3  ? Title Pt will complete bathing with min A.   ? Time 4   ? Period Weeks   ? Status On-going   ?  ? OT SHORT TERM GOAL #4  ? Title Pt / caregiver will verbalize understanding of adapted strategies to increase independence with ADLs/IADLs and to minimize pain.( sitting for bathing, use of a schedule to assist with initation prn)   ? Time 4   ? Period Weeks   ? Status On-going   ?  ? OT SHORT TERM GOAL #5  ? Title Pt will increase bilateral grip strength by 5 lbs for increased ease with ADLs/ IADLs.   ? Time 4   ? Period Weeks   ? Status On-going   ?  ? OT SHORT TERM GOAL #6  ? Title Pt will decrease 9 hole peg test score by 3 secs bilaterally for increased ease with ADLs/IADLs.   ? Time 4   ? Period Weeks   ? Status On-going   ? ?  ?  ? ?  ? ? ? ? OT Long Term Goals - 09/15/21 1630   ? ?  ? OT LONG TERM GOAL #1  ? Title I with updated HEP.   ? Time 12   ? Period Weeks   ? Status On-going   ? Target Date 11/24/21   ?  ? OT LONG TERM GOAL #2  ? Title Pt will perform all basic ADLs at a modified independent level.   ? Time 12   ? Period Weeks   ? Status On-going   ?  ? OT LONG TERM GOAL #3  ? Title Pt will demonstrate ability to retrieve a 3 lbs  object at 125* shoulder flexion with LUE  without dropping   ? Time 12   ? Period Weeks   ? Status On-going   ?  ? OT LONG TERM GOAL #4  ? Title Pt will perform simple cooking task with supervision demonstrating good safety awareness.   ? Time 12   ? Period Weeks   ? Status On-going   ?  ? OT LONG TERM GOAL #5  ? Title Pt will perform basic home mangement tasks at a mod I level .   ? Time 12   ? Period Weeks   ? Status On-going   ? ?  ?  ? ?  ? ? ? ? ? ? ? ?  Plan - 09/15/21 1629   ? ? Clinical Impression Statement Pt continues to report significant shoulder and neck pain which limits ability to participate in daily ADL   ? OT Occupational Profile and History Detailed Assessment- Review of Records and additional review of physical,  cognitive, psychosocial history related to current functional performance   ? Occupational performance deficits (Please refer to evaluation for details): ADL's;IADL's;Rest and Sleep;Work;Leisure;Social Participation   ? Body Structure / Function / Physical Skills ADL;Balance;Endurance;Mobility;Strength;Flexibility;UE functional use;FMC;Pain;Gait;Coordination;ROM;GMC;Decreased knowledge of precautions;Decreased knowledge of use of DME;Dexterity;IADL;Vision   ? Cognitive Skills Attention;Energy/Drive;Learn;Memory;Problem Solve;Safety Awareness;Temperament/Personality;Thought;Understand   ? Rehab Potential Good   ? Clinical Decision Making Limited treatment options, no task modification necessary   ? Comorbidities Affecting Occupational Performance: May have comorbidities impacting occupational performance   ? Modification or Assistance to Complete Evaluation  Min-Moderate modification of tasks or assist with assess necessary to complete eval   ? OT Frequency 2x / week   ? OT Duration 12 weeks   ? OT Treatment/Interventions Self-care/ADL training;Therapeutic exercise;Functional Mobility Training;Balance training;Splinting;Manual Therapy;Neuromuscular education;Ultrasound;Aquatic Therapy;Therapeutic activities;Cognitive remediation/compensation;DME and/or AE instruction;Paraffin;Cryotherapy;Electrical Stimulation;Visual/perceptual remediation/compensation;Fluidtherapy;Moist Heat;Passive range of motion;Patient/family education;Energy conservation   ? Plan Ask about sleep diary, check on eye exercises and progress prn - consider further vision testing (monocular to binocular)  pursuits, saccades - very limited in superior fields, hand strengthening, ADL -   ? Consulted and Agree with Plan of Care Patient   ? ?  ?  ? ?  ? ? ?Patient will benefit from skilled therapeutic intervention in order to improve the following deficits and impairments:   ?Body Structure / Function / Physical Skills: ADL, Balance, Endurance,  Mobility, Strength, Flexibility, UE functional use, FMC, Pain, Gait, Coordination, ROM, GMC, Decreased knowledge of precautions, Decreased knowledge of use of DME, Dexterity, IADL, Vision ?Cognitive Skills: Attention,

## 2021-09-19 ENCOUNTER — Telehealth: Payer: Self-pay

## 2021-09-19 ENCOUNTER — Encounter: Payer: Self-pay | Admitting: Speech Pathology

## 2021-09-19 ENCOUNTER — Ambulatory Visit
Payer: No Typology Code available for payment source | Attending: Physical Medicine & Rehabilitation | Admitting: Speech Pathology

## 2021-09-19 DIAGNOSIS — R41844 Frontal lobe and executive function deficit: Secondary | ICD-10-CM | POA: Diagnosis present

## 2021-09-19 DIAGNOSIS — R2681 Unsteadiness on feet: Secondary | ICD-10-CM | POA: Diagnosis present

## 2021-09-19 DIAGNOSIS — R42 Dizziness and giddiness: Secondary | ICD-10-CM | POA: Insufficient documentation

## 2021-09-19 DIAGNOSIS — M25612 Stiffness of left shoulder, not elsewhere classified: Secondary | ICD-10-CM | POA: Diagnosis present

## 2021-09-19 DIAGNOSIS — R41841 Cognitive communication deficit: Secondary | ICD-10-CM | POA: Diagnosis present

## 2021-09-19 DIAGNOSIS — G44329 Chronic post-traumatic headache, not intractable: Secondary | ICD-10-CM | POA: Diagnosis present

## 2021-09-19 DIAGNOSIS — M6281 Muscle weakness (generalized): Secondary | ICD-10-CM | POA: Insufficient documentation

## 2021-09-19 DIAGNOSIS — R262 Difficulty in walking, not elsewhere classified: Secondary | ICD-10-CM | POA: Insufficient documentation

## 2021-09-19 DIAGNOSIS — M542 Cervicalgia: Secondary | ICD-10-CM | POA: Insufficient documentation

## 2021-09-19 DIAGNOSIS — M25611 Stiffness of right shoulder, not elsewhere classified: Secondary | ICD-10-CM | POA: Diagnosis present

## 2021-09-19 DIAGNOSIS — R278 Other lack of coordination: Secondary | ICD-10-CM | POA: Diagnosis present

## 2021-09-19 NOTE — Telephone Encounter (Signed)
Worker's Comp- Prior Authorization needed for Quetiapine Fumarte & Escitalopram Oxalate. Request faxed to Asencion Partridge RN, NCM, Phone [phone], Fax [phone].  ?

## 2021-09-19 NOTE — Therapy (Signed)
[facility] ?[place] ?[place], [place], [address] ?Phone: [phone]   Fax:  [phone] ? ?Speech Language Pathology Treatment & Discharge Summary ? ?Patient Details  ?Name: Craig Hart ?MRN: [id number] ?Date of Birth: [birth year] ?Referring Provider (SLP): Dr. Alger Hart ? ? ?Encounter Date: 09/19/2021 ? ? End of Session - 09/19/21 1321   ? ? Visit Number 33   ? Number of Visits 36   ? Date for SLP Re-Evaluation 10/27/21   ? Authorization Type WC - 36   ? Authorization - Visit Number 33   ? Authorization - Number of Visits 36   ? SLP Start Time 1230   ? SLP Stop Time  1315   ? SLP Time Calculation (min) 45 min   ? Activity Tolerance Patient tolerated treatment well   ? ?  ?  ? ?  ? ? ?Past Medical History:  ?Diagnosis Date  ? Hypertension   ? Hypertensive emergency 09/21/2014  ? ? ?Past Surgical History:  ?Procedure Laterality Date  ? LEFT HEART CATHETERIZATION WITH CORONARY ANGIOGRAM N/A 09/21/2014  ? Procedure: LEFT HEART CATHETERIZATION WITH CORONARY ANGIOGRAM;  Surgeon: Craig Mocha, MD;  Location: [facility] CATH LAB;  Service: Cardiovascular:  minimal LAD disease.  20-30% mid RCA disease with a right dominant system.  EF 65-70%. --False activation of ST elevation MI.  ? SHOULDER SURGERY Right 06/29/2021  ? TONSILLECTOMY    ? ? ?There were no vitals filed for this visit. ? ? Subjective Assessment - 09/19/21 1240   ? ? Subjective "I should have kept those happy pills, I would have been better"   ? Patient is accompained by: Family member   Craig Hart wife  ? Currently in Pain? Yes   ? Pain Score 7    ? Pain Location Shoulder   ? Pain Orientation Right   ? Pain Descriptors / Indicators Aching   ? Pain Type Chronic pain   ? Pain Onset More than a month ago   ? Pain Frequency Constant   ? ?  ?  ? ?  ? ? ? ? ? ? ? ? ADULT SLP TREATMENT - 09/19/21 1238   ? ?  ? General Information  ? Behavior/Cognition Alert;Cooperative;Pleasant mood   ?  ? Treatment  Provided  ? Treatment provided Cognitive-Linquistic   ?  ? Cognitive-Linquistic Treatment  ? Treatment focused on Cognition;Patient/family/caregiver education   ? Skilled Treatment Craig Hart reports consistently completing HEP for OT and PT daily without remiders from Craig Hart, as it is "in my routine now" He continues to need to look at his calendar to recall appointments however Craig Hart states that he is remembering his appointments when she asks him. He is following his to do lists. Nausea and pain continue to affect ability to return to PLOF   ?  ? Assessment / Recommendations / Plan  ? Plan Discharge SLP treatment due to (comment)   ?  ? Progression Toward Goals  ? Progression toward goals Goals met, education completed, patient discharged from SLP   ? ?  ?  ? ?  ? ?[place]anquillity ? ?Visits from Start of Care: 33 ? ?Current functional level related to goals / functional outcomes: ?See goals below ?  ?Remaining deficits: ?Mild high level cognitive impairments  ?  ?Education / Equipment: ?Compensations for memory, attention, initiation, problem solving; cognitive activities to do at home  ? ?Patient agrees to discharge. Patient goals were met. Patient is being discharged due  to meeting the stated rehab goals.. ? ? ? ? ? ? SLP Short Term Goals - 09/19/21 1325   ? ?  ? SLP SHORT TERM GOAL #1  ? Title Will complete CLQT and modify goals as needed   ? Status Achieved   ?  ? SLP SHORT TERM GOAL #2  ? Title Pt will keep calendar with appointments and daily schedule to complete 1 household task a day and 1 cognitive task a day for 10-15 minutes.   ? Status Achieved   ?  ? SLP SHORT TERM GOAL #3  ? Title Pt  will carryover 2 strategies for processing and recalling information from healthcare providers with occasional min A over 2 sessions   ? Status Partially Met   ?  ? SLP SHORT TERM GOAL #4  ? Title Craig Hart will attend to simple cognitive linguistic task for 15 minutes with 3 or less redirections over 3  sessions   ? Baseline 04-18-21, 04-22-21; 04-25-21   ? Status Achieved   ?  ? SLP SHORT TERM GOAL #5  ? Title Pt will use pill organizer, alarm if needed or other compensation to manage his medication with no misses/mistakes over 1 week   ? Status Achieved   ? ?  ?  ? ?  ? ? ? SLP Long Term Goals - 09/19/21 1430   ? ?  ? SLP LONG TERM GOAL #1  ? Title Pt will manage his schedule and time to be ready for appointtments and completed 2 chores a day and 2 cognitive activities for 15 minutes   ? Baseline 05/09/21, 05/30/21   ? Status Achieved   ?  ? SLP LONG TERM GOAL #2  ? Title Pt/family will carryover 3 compensations for pt to participate in  4 turns in a conversation or 4 instructions without need for repeptition   ? Status Achieved   ?  ? SLP LONG TERM GOAL #3  ? Title Pt will plan and carrout a visit with 1 friend/extended family member on 3 occasions for 20 minute visits with rare min A from sposue   ? Time 4   ? Period Weeks   ? Status Deferred   ongoing for recert  ?  ? SLP LONG TERM GOAL #4  ? Title Pt will alternate attention on cognitive linguistic task for 20 minutes with 3 or less redirections   ? Baseline 06-23-21, 09/14/21, 09/18/21   ? Time 8   ? Period Weeks   ? Status Achieved   ongoing for recert  ?  ? SLP LONG TERM GOAL #5  ? Title [name] will improve score on Neuro-QOL Cognitive Function short form by 3 points   ? Baseline Craig Hart - 16; Craig Hart - 13   ? Time 1   ? Period Weeks   ? Status Achieved   ongoing for recert  ?  ? SLP LONG TERM GOAL #6  ? Title Pt will initiate 2 episodes where he requests help from spouse to complete medical or insurance paperwork with use of to do list or calendar   ? Baseline 09/14/21   ? Time 8   ? Period Weeks   ? Status Achieved   ?  ? SLP LONG TERM GOAL #7  ? Title Craig Hart will complete all HEP's with 3 or less reminders over 1 week using calendar or to do   ? Baseline 09/14/21   ? Time 8   ? Period Weeks   ?  Status Achieved   ? ?  ?  ? ?  ? ? ? Plan - 09/19/21 1321   ? ?  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 Craig Hart has improved in planning and intiating at home per spouse, she is pleased. Colbert also reports he is feeling better about his cognition. Neuropsych consult complete - he will begin counseling in May. Sleepiness, nausea and pain affecting his ability to return to PLOF and these remain internal distractions affecting his ability to focus and pay attention. At this time, ST goals have been met. I recommend [place] d/c ST, focus on PT and OT as well as managing pain, sleep and nausea. May consider another course of ST after he finishes with Dr. Sima Matas and pain and nasuea are improved. Written expression continues to require mod A, howver Avier is using his phone to successfully correct error words. Discharge ST at this time, pt and spouse are in agreement.   ? Speech Therapy Frequency 1x /week   ? Duration --   24 weeks  ? Treatment/Interventions Compensatory strategies;Patient/family education;Functional tasks;Cueing hierarchy;Multimodal communcation approach;Cognitive reorganization;Environmental controls;SLP instruction and feedback;Internal/external aids;Compensatory techniques;Language facilitation   ? Potential to Achieve Goals Good   ? ?  ?  ? ?  ? ? ?Patient will benefit from skilled therapeutic intervention in order to improve the following deficits and impairments:   ?Cognitive communication deficit ? ? ? ?Problem List ?Patient Active Problem List  ? Diagnosis Date Noted  ? Dizziness and giddiness 02/03/2021  ? Cervical spondylosis without myelopathy 05/06/2020  ? Myalgia 04/22/2020  ? Sleep disturbance 04/05/2020  ? Neck pain 04/05/2020  ? Post concussive syndrome 03/22/2020  ? Nausea without vomiting 03/22/2020  ? Chest pain 08/19/2017  ? Hypertensive urgency 08/19/2017  ? Chest pain with moderate risk for cardiac etiology   ? ST elevation   ? Hypokalemia 09/23/2014  ? Abnormal EKG 09/23/2014  ?  Hyperlipidemia 09/23/2014  ? Accelerated hypertension 09/21/2014  ? ? ?Tunis Gentle, Annye Rusk, CCC-SLP ?09/19/2021, 2:31 PM ? ?[facility] ?[place] ?[place]

## 2021-09-19 NOTE — Patient Instructions (Signed)
? ? ?  Make a general plan for [facility] or [facility] ? ?Pick dates (probably a long weekend) the time of year you  like it up there ? ?List 3 hotels or places to stay ? ?List 3 restaurants you may want to try ? ?Look up any restaurants that may serve elk or other exotic meats ? ?List 2 activities you can do ? ?Email me the list  ? ?Look up the dirty dozen in produce ? ?It's OK to stick up for yourself and let others know "I'm feeling overwhelmed and I need you to slow down ? ?Take this time to focus on your physical rehab, nausea, pain, and counseling etc but keep up your calendar and to do list ? ?If you feel like he needs to come back, get an order from Dr. Riley Kill after [name] finishes with Dr. Kieth Brightly ? ?[email or web address] ?

## 2021-09-20 ENCOUNTER — Ambulatory Visit: Payer: No Typology Code available for payment source | Admitting: Occupational Therapy

## 2021-09-20 DIAGNOSIS — R41841 Cognitive communication deficit: Secondary | ICD-10-CM | POA: Diagnosis not present

## 2021-09-20 DIAGNOSIS — R41844 Frontal lobe and executive function deficit: Secondary | ICD-10-CM

## 2021-09-20 NOTE — Therapy (Signed)
[facility] ?[address] ?[place], [place], [address] ?Phone: [phone]   Fax:  [phone] ? ?Occupational Therapy Treatment ? ?Patient Details  ?Name: Craig Hart ?MRN: [id number] ?Date of Birth: 1955-09-15 ?Referring Provider (OT): Dr. Riley Kill ? ? ?Encounter Date: 09/20/2021 ? ? OT End of Session - 09/20/21 1420   ? ? Visit Number 6   ? Number of Visits 25   ? Date for OT Re-Evaluation 11/24/21   ? Authorization Type Worker's comp, 12 visits approved, state [employer]   ? Authorization - Visit Number 6   ? Authorization - Number of Visits 12   ? OT Start Time 1320   ? OT Stop Time 1405   ? OT Time Calculation (min) 45 min   ? Activity Tolerance --   Nausea w/ eye movement  ? Behavior During Therapy [facility] for tasks assessed/performed   ? ?  ?  ? ?  ? ? ?Past Medical History:  ?Diagnosis Date  ? Hypertension   ? Hypertensive emergency 09/21/2014  ? ? ?Past Surgical History:  ?Procedure Laterality Date  ? LEFT HEART CATHETERIZATION WITH CORONARY ANGIOGRAM N/A 09/21/2014  ? Procedure: LEFT HEART CATHETERIZATION WITH CORONARY ANGIOGRAM;  Surgeon: Tonny Bollman, MD;  Location: [facility] CATH LAB;  Service: Cardiovascular:  minimal LAD disease.  20-30% mid RCA disease with a right dominant system.  EF 65-70%. --False activation of ST elevation MI.  ? SHOULDER SURGERY Right 06/29/2021  ? TONSILLECTOMY    ? ? ?There were no vitals filed for this visit. ? ? Subjective Assessment - 09/20/21 1335   ? ? 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 MRI report of brain was relatively unremarkable for head injury Cervical myelopathy,  09/21/20 to C3-4 discectomy with fusion , R shoulder surgery 06/29/21  - for torn tendon and removal of bone spurs on 06/29/21- receiving PT for R shoulder   ? Limitations Goes by "[name]"   ? Patient Stated Goals increase  independence with ADLs/IADLS   ? Currently in Pain? Yes   ? Pain Score 7    ? Pain Location Shoulder   and neck  ? Pain Orientation Right   ? Pain Descriptors / Indicators Aching   ? Pain Type Chronic pain   ? Pain Onset More than a month ago   ? Aggravating Factors  movement   ? Pain Relieving Factors rest, meds   ? ?  ?  ? ?  ? ?Pt taken back to quiet, dimly lit private room for session:  ?Discussed UB dressing w/ task modifications d/t recent Rt shoulder sx, however pt/wife very resistant due to ongoing pain at shoulder and neck, as well as Lt shoulder pain when attempting this.  ? ?Discussed sleep pattern - pt reports sleeping no more than 3 consecutive hours and wife reports he is sleeping more during the day because he cannot get sufficient sleep at night. "He has no trouble falling asleep, but staying asleep is the issue". Pt in process of getting sleep study. Discussed possible natural supplements to help aid better sleep as well, but therapist instructed pt/wife to discuss w/ MD before starting.  ? ?Discussed dizziness and nausea w/ eye movements. Previous O.T. Bretta Bang) identified limitations in superior fields and when looking left. She also recommended working smooth pursuits and saccades first monocular vision then to binocular vision. Recommended pt have plain background when doing  this ? ?Reviewed smooth pursuits HEP w/ pt/wife but adapted to work with 1 eye at a time first, then smaller ranges w/ both eyes.  ?Progressed to saccades horizontally and vertically with one eye at a time, then instructed to gradually work towards both eyes together. Wife is able to help with this ? ? ? ? ? ? ? ? ? ? ? ? ? ? ? ? ? ? ? ? ? ? ? ? OT Short Term Goals - 09/15/21 1630   ? ?  ? OT SHORT TERM GOAL #1  ? Title I with initial HEP   ? Time 4   ? Period Weeks   ? Status On-going   ? Target Date 09/29/21   ?  ? OT SHORT TERM GOAL #2  ? Title Pt will donn shirt consistently with min A.   ? Time 4   ? Period Weeks    ? Status On-going   ?  ? OT SHORT TERM GOAL #3  ? Title Pt will complete bathing with min A.   ? Time 4   ? Period Weeks   ? Status On-going   ?  ? OT SHORT TERM GOAL #4  ? Title Pt / caregiver will verbalize understanding of adapted strategies to increase independence with ADLs/IADLs and to minimize pain.( sitting for bathing, use of a schedule to assist with initation prn)   ? Time 4   ? Period Weeks   ? Status On-going   ?  ? OT SHORT TERM GOAL #5  ? Title Pt will increase bilateral grip strength by 5 lbs for increased ease with ADLs/ IADLs.   ? Time 4   ? Period Weeks   ? Status On-going   ?  ? OT SHORT TERM GOAL #6  ? Title Pt will decrease 9 hole peg test score by 3 secs bilaterally for increased ease with ADLs/IADLs.   ? Time 4   ? Period Weeks   ? Status On-going   ? ?  ?  ? ?  ? ? ? ? OT Long Term Goals - 09/15/21 1630   ? ?  ? OT LONG TERM GOAL #1  ? Title I with updated HEP.   ? Time 12   ? Period Weeks   ? Status On-going   ? Target Date 11/24/21   ?  ? OT LONG TERM GOAL #2  ? Title Pt will perform all basic ADLs at a modified independent level.   ? Time 12   ? Period Weeks   ? Status On-going   ?  ? OT LONG TERM GOAL #3  ? Title Pt will demonstrate ability to retrieve a 3 lbs  object at 125* shoulder flexion with LUE  without dropping   ? Time 12   ? Period Weeks   ? Status On-going   ?  ? OT LONG TERM GOAL #4  ? Title Pt will perform simple cooking task with supervision demonstrating good safety awareness.   ? Time 12   ? Period Weeks   ? Status On-going   ?  ? OT LONG TERM GOAL #5  ? Title Pt will perform basic home mangement tasks at a mod I level .   ? Time 12   ? Period Weeks   ? Status On-going   ? ?  ?  ? ?  ? ? ? ? ? ? ? ? Plan - 09/20/21 1422   ? ? Clinical Impression Statement Pt  continues to report significant shoulder and neck pain which limits ability to participate in daily ADLs. Pt w/ nausea and dizziness w/ eye movements   ? OT Occupational Profile and History Detailed Assessment-  Review of Records and additional review of physical, cognitive, psychosocial history related to current functional performance   ? Occupational performance deficits (Please refer to evaluation for details): ADL's;IADL's;Rest and Sleep;Work;Leisure;Social Participation   ? Body Structure / Function / Physical Skills ADL;Balance;Endurance;Mobility;Strength;Flexibility;UE functional use;FMC;Pain;Gait;Coordination;ROM;GMC;Decreased knowledge of precautions;Decreased knowledge of use of DME;Dexterity;IADL;Vision   ? Cognitive Skills Attention;Energy/Drive;Learn;Memory;Problem Solve;Safety Awareness;Temperament/Personality;Thought;Understand   ? Rehab Potential Good   ? Clinical Decision Making Limited treatment options, no task modification necessary   ? Comorbidities Affecting Occupational Performance: May have comorbidities impacting occupational performance   ? Modification or Assistance to Complete Evaluation  Min-Moderate modification of tasks or assist with assess necessary to complete eval   ? OT Frequency 2x / week   ? OT Duration 12 weeks   ? OT Treatment/Interventions Self-care/ADL training;Therapeutic exercise;Functional Mobility Training;Balance training;Splinting;Manual Therapy;Neuromuscular education;Ultrasound;Aquatic Therapy;Therapeutic activities;Cognitive remediation/compensation;DME and/or AE instruction;Paraffin;Cryotherapy;Electrical Stimulation;Visual/perceptual remediation/compensation;Fluidtherapy;Moist Heat;Passive range of motion;Patient/family education;Energy conservation   ? Plan continue to reinforce monocular to binocular practice of smooth pursuits and saccades, and gradually increasing range. Follow up on sleep diary, hand strengthening, reinforce LB dressing strategies (cross leg technique or maybe use of stool d/t hesitant to bend over)  ? Recommended Other Services PT is addressing RUE shoulder s/p surgery, OT will address shoulder only in a functional context   ? Consulted and Agree  with Plan of Care Patient   ? ?  ?  ? ?  ? ? ?Patient will benefit from skilled therapeutic intervention in order to improve the following deficits and impairments:   ?Body Structure / Function / Physical Skills: A

## 2021-09-21 ENCOUNTER — Ambulatory Visit: Payer: No Typology Code available for payment source

## 2021-09-21 VITALS — BP 113/80 | HR 91

## 2021-09-21 DIAGNOSIS — R2681 Unsteadiness on feet: Secondary | ICD-10-CM

## 2021-09-21 DIAGNOSIS — R42 Dizziness and giddiness: Secondary | ICD-10-CM

## 2021-09-21 DIAGNOSIS — M542 Cervicalgia: Secondary | ICD-10-CM

## 2021-09-21 DIAGNOSIS — R41841 Cognitive communication deficit: Secondary | ICD-10-CM | POA: Diagnosis not present
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
## 2021-09-23 ENCOUNTER — Encounter: Payer: Self-pay | Admitting: Occupational Therapy

## 2021-09-23 ENCOUNTER — Ambulatory Visit: Payer: No Typology Code available for payment source | Admitting: Physical Therapy

## 2021-09-23 ENCOUNTER — Ambulatory Visit: Payer: No Typology Code available for payment source | Admitting: Occupational Therapy

## 2021-09-23 DIAGNOSIS — M6281 Muscle weakness (generalized): Secondary | ICD-10-CM

## 2021-09-23 DIAGNOSIS — R42 Dizziness and giddiness: Secondary | ICD-10-CM

## 2021-09-23 DIAGNOSIS — R41841 Cognitive communication deficit: Secondary | ICD-10-CM | POA: Diagnosis not present

## 2021-09-23 DIAGNOSIS — R41844 Frontal lobe and executive function deficit: Secondary | ICD-10-CM

## 2021-09-23 DIAGNOSIS — M25611 Stiffness of right shoulder, not elsewhere classified: Secondary | ICD-10-CM

## 2021-09-23 DIAGNOSIS — R278 Other lack of coordination: Secondary | ICD-10-CM

## 2021-09-23 DIAGNOSIS — R262 Difficulty in walking, not elsewhere classified: Secondary | ICD-10-CM

## 2021-09-23 DIAGNOSIS — R2681 Unsteadiness on feet: Secondary | ICD-10-CM

## 2021-09-23 DIAGNOSIS — G44329 Chronic post-traumatic headache, not intractable: Secondary | ICD-10-CM

## 2021-09-23 DIAGNOSIS — M542 Cervicalgia: Secondary | ICD-10-CM

## 2021-09-23 DIAGNOSIS — M25612 Stiffness of left shoulder, not elsewhere classified: Secondary | ICD-10-CM
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
## 2021-09-23 NOTE — Therapy (Signed)
[place] [facility] ?[address] ?[place], [place], [address] ?Phone: [phone]   Fax:  [phone] ? ?Occupational Therapy Treatment ? ?Patient Details  ?Name: Craig Hart ?MRN: [id number] ?Date of Birth: [birth year] ?Referring Provider (OT): Dr. Riley KillSwartz ? ? ?Encounter Date: 09/23/2021 ? ? OT End of Session - 09/23/21 1152   ? ? Visit Number 7   ? Number of Visits 25   ? Date for OT Re-Evaluation 11/24/21   ? Authorization Type Worker's comp, 12 visits approved, state [employer]   ? Authorization - Visit Number 7   ? Authorization - Number of Visits 12   ? OT Start Time 1104   ? OT Stop Time 1145   ? OT Time Calculation (min) 41 min   ? ?  ?  ? ?  ? ? ?Past Medical History:  ?Diagnosis Date  ? Hypertension   ? Hypertensive emergency 09/21/2014  ? ? ?Past Surgical History:  ?Procedure Laterality Date  ? LEFT HEART CATHETERIZATION WITH CORONARY ANGIOGRAM N/A 09/21/2014  ? Procedure: LEFT HEART CATHETERIZATION WITH CORONARY ANGIOGRAM;  Surgeon: Tonny BollmanMichael Cooper, MD;  Location[facility] C[facility];  Service: Cardiovascular:  minimal LAD disease.  20-30% mid RCA disease with a right dominant system.  EF 65-70%. --False activation of ST elevation MI.  ? SHOULDER SURGERY Right 06/29/2021  ? TONSILLECTOMY    ? ? ?There were no vitals filed for this visit. ? ? Subjective Assessment - 09/23/21 1106   ? ? 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 MRI report of brain was relatively unremarkable for head injury Cervical myelopathy,  09/21/20 to C3-4 discectomy with fusion , R shoulder surgery 06/29/21  - for torn tendon and removal of bone spurs on 06/29/21- receiving PT for R shoulder   ? Currently in Pain? Yes   ? Pain Score 8    ? Pain Location Shoulder   ? Pain Orientation Right   ? Pain Descriptors / Indicators Aching   ? Pain Type Chronic pain   ? Pain Onset  More than a month ago   ? Pain Frequency Constant   ? Aggravating Factors  movment   ? Pain Relieving Factors rest, meds   ? ?  ?  ? ?  ? ? ? ? ? ? ? ? ? ? ? ? ? ? ? ? ? ?Treatment: Treatment occurred in a quiet room with dimmed lights. ?Pt discussed activities that pt can perform at home. Pt has been standing on a step stool to change air filters. Therapist recommends pt holds off on this due to balance issues and fall risk ?After discussion with pt/ wife therapist  has asked pt to make hot tea tomorrow with his wife present as this is a simple task that he can preform at home to increase his activity level. ?Gripper set at level 2 to pick up 1 inch blocks with LUE,only 1/4 of the blocks used, frquent rest breaks required. ? ? ? ? ? OT Treatment/Education - 09/23/21 1141   ? ? Education Details Discussion with pt/ wife about improtance of follow through and completion of sleep diary that was issued and requested to bring back ( issued 09/07/21), Pt. has not competed and brought back yet Verbally Reviewed LB dressing in seated with pt crossing leg across knee for safety with pt/ wife, discussed use of long handled splint for increased I with bathing.  Reviewed stacking and manipulating coins from HEP and rotating ball, additional handout was issued.  ? Person(s) Educated Patient   ? Methods Explanation;Handout;Demonstration;Verbal cues   ? Comprehension Verbalized understanding;Returned demonstration;Verbal cues required   ? ?  ?  ? ?  ? ? ? OT Short Term Goals - 09/15/21 1630   ? ?  ? OT SHORT TERM GOAL #1  ? Title I with initial HEP   ? Time 4   ? Period Weeks   ? Status On-going   ? Target Date 09/29/21   ?  ? OT SHORT TERM GOAL #2  ? Title Pt will donn shirt consistently with min A.   ? Time 4   ? Period Weeks   ? Status On-going   ?  ? OT SHORT TERM GOAL #3  ? Title Pt will complete bathing with min A.   ? Time 4   ? Period Weeks   ? Status On-going   ?  ? OT SHORT TERM GOAL #4  ? Title Pt / caregiver will  verbalize understanding of adapted strategies to increase independence with ADLs/IADLs and to minimize pain.( sitting for bathing, use of a schedule to assist with initation prn)   ? Time 4   ? Period Weeks   ? Status On-going   ?  ? OT SHORT TERM GOAL #5  ? Title Pt will increase bilateral grip strength by 5 lbs for increased ease with ADLs/ IADLs.   ? Time 4   ? Period Weeks   ? Status On-going   ?  ? OT SHORT TERM GOAL #6  ? Title Pt will decrease 9 hole peg test score by 3 secs bilaterally for increased ease with ADLs/IADLs.   ? Time 4   ? Period Weeks   ? Status On-going   ? ?  ?  ? ?  ? ? ? ? OT Long Term Goals - 09/15/21 1630   ? ?  ? OT LONG TERM GOAL #1  ? Title I with updated HEP.   ? Time 12   ? Period Weeks   ? Status On-going   ? Target Date 11/24/21   ?  ? OT LONG TERM GOAL #2  ? Title Pt will perform all basic ADLs at a modified independent level.   ? Time 12   ? Period Weeks   ? Status On-going   ?  ? OT LONG TERM GOAL #3  ? Title Pt will demonstrate ability to retrieve a 3 lbs  object at 125* shoulder flexion with LUE  without dropping   ? Time 12   ? Period Weeks   ? Status On-going   ?  ? OT LONG TERM GOAL #4  ? Title Pt will perform simple cooking task with supervision demonstrating good safety awareness.   ? Time 12   ? Period Weeks   ? Status On-going   ?  ? OT LONG TERM GOAL #5  ? Title Pt will perform basic home mangement tasks at a mod I level .   ? Time 12   ? Period Weeks   ? Status On-going   ? ?  ?  ? ?  ? ? ? ? ? ? ? ? Plan - 09/23/21 1153   ? ? Clinical Impression Statement Pt is progressing slowly towards goals. He ramins limited by pain and nausea. Pt continues to sleep during the daytime.   ? OT Occupational Profile and History Detailed Assessment- Review of  Records and additional review of physical, cognitive, psychosocial history related to current functional performance   ? Occupational performance deficits (Please refer to evaluation for details): ADL's;IADL's;Rest and  Sleep;Work;Leisure;Social Participation   ? Body Structure / Function / Physical Skills ADL;Balance;Endurance;Mobility;Strength;Flexibility;UE functional use;FMC;Pain;Gait;Coordination;ROM;GMC;Decreased knowledge of precautions;Decreased knowledge of use of DME;Dexterity;IADL;Vision   ? Cognitive Skills Attention;Energy/Drive;Learn;Memory;Problem Solve;Safety Awareness;Temperament/Personality;Thought;Understand   ? Rehab Potential Good   ? Clinical Decision Making Limited treatment options, no task modification necessary   ? Comorbidities Affecting Occupational Performance: May have comorbidities impacting occupational performance   ? Modification or Assistance to Complete Evaluation  Min-Moderate modification of tasks or assist with assess necessary to complete eval   ? OT Frequency 2x / week   ? OT Duration 12 weeks   ? OT Treatment/Interventions Self-care/ADL training;Therapeutic exercise;Functional Mobility Training;Balance training;Splinting;Manual Therapy;Neuromuscular education;Ultrasound;Aquatic Therapy;Therapeutic activities;Cognitive remediation/compensation;DME and/or AE instruction;Paraffin;Cryotherapy;Electrical Stimulation;Visual/perceptual remediation/compensation;Fluidtherapy;Moist Heat;Passive range of motion;Patient/family education;Energy conservation   ? Plan . Follow up on sleep diary, consider developing a schedule with pt/ wife of activities that he can be engaged in during the day, hand strengthening   ? Recommended Other Services PT is addressing RUE shoulder s/p surgery, OT will address shoulder only in a functional context   ? Consulted and Agree with Plan of Care Patient   ? ?  ?  ? ?  ? ? ?Patient will benefit from skilled therapeutic intervention in order to improve the following deficits and impairments:   ?Body Structure / Function / Physical Skills: ADL, Balance, Endurance, Mobility, Strength, Flexibility, UE functional use, FMC, Pain, Gait, Coordination, ROM, GMC, Decreased  knowledge of precautions, Decreased knowledge of use of DME, Dexterity, IADL, Vision ?Cognitive Skills: Attention, Energy/Drive, Learn, Memory, Problem Solve, Safety Awareness, Temperament/Personality, Thought, Understand ?

## 2021-09-28 ENCOUNTER — Ambulatory Visit: Payer: No Typology Code available for payment source

## 2021-09-28 ENCOUNTER — Ambulatory Visit
Payer: No Typology Code available for payment source | Attending: Physical Medicine & Rehabilitation | Admitting: Occupational Therapy

## 2021-09-28 ENCOUNTER — Encounter: Payer: Self-pay | Admitting: Occupational Therapy

## 2021-09-28 VITALS — BP 133/76 | HR 57

## 2021-09-28 DIAGNOSIS — M542 Cervicalgia: Secondary | ICD-10-CM | POA: Insufficient documentation

## 2021-09-28 DIAGNOSIS — G44329 Chronic post-traumatic headache, not intractable: Secondary | ICD-10-CM | POA: Diagnosis present

## 2021-09-28 DIAGNOSIS — R262 Difficulty in walking, not elsewhere classified: Secondary | ICD-10-CM | POA: Insufficient documentation

## 2021-09-28 DIAGNOSIS — M25611 Stiffness of right shoulder, not elsewhere classified: Secondary | ICD-10-CM | POA: Diagnosis present

## 2021-09-28 DIAGNOSIS — R42 Dizziness and giddiness: Secondary | ICD-10-CM | POA: Insufficient documentation

## 2021-09-28 DIAGNOSIS — M25612 Stiffness of left shoulder, not elsewhere classified: Secondary | ICD-10-CM | POA: Insufficient documentation

## 2021-09-28 DIAGNOSIS — M6281 Muscle weakness (generalized): Secondary | ICD-10-CM | POA: Insufficient documentation

## 2021-09-28 DIAGNOSIS — R41844 Frontal lobe and executive function deficit: Secondary | ICD-10-CM | POA: Diagnosis present

## 2021-09-28 DIAGNOSIS — R278 Other lack of coordination: Secondary | ICD-10-CM | POA: Insufficient documentation

## 2021-09-28 DIAGNOSIS — R2681 Unsteadiness on feet: Secondary | ICD-10-CM | POA: Insufficient documentation

## 2021-09-28 NOTE — Patient Instructions (Signed)
[employer] ([employer]) ? ?[email or web address] ?
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
## 2021-09-29 ENCOUNTER — Ambulatory Visit: Payer: No Typology Code available for payment source | Admitting: Occupational Therapy

## 2021-09-29 DIAGNOSIS — R42 Dizziness and giddiness: Secondary | ICD-10-CM

## 2021-09-29 NOTE — Therapy (Signed)
[facility] ?[place], [place], [address] ?Phone: [phone]   Fax:  [phone] ? ?Occupational Therapy Treatment ? ?Patient Details  ?Name: Craig Hart ?MRN: [id number] ?Date of Birth: [birth year] ?Referring Provider (OT): Dr. Naaman Plummer ? ? ?Encounter Date: 09/29/2021 ? ? OT End of Session - 09/29/21 1124   ? ? Visit Number 8   ? Number of Visits 25   ? Date for OT Re-Evaluation 11/24/21   ? Authorization Type Worker's comp, 12 visits approved, state BCBS   ? Authorization - Visit Number 8   ? Authorization - Number of Visits 12   ? OT Start Time 1100   ? OT Stop Time 1105   ? OT Time Calculation (min) 5 min   ? Activity Tolerance Treatment limited secondary to medical complications (Comment)   ? ?  ?  ? ?  ? ? ?Past Medical History:  ?Diagnosis Date  ? Hypertension   ? Hypertensive emergency 09/21/2014  ? ? ?Past Surgical History:  ?Procedure Laterality Date  ? LEFT HEART CATHETERIZATION WITH CORONARY ANGIOGRAM N/A 09/21/2014  ? Procedure: LEFT HEART CATHETERIZATION WITH CORONARY ANGIOGRAM;  Surgeon: Sherren Mocha, MD;  Location: [facility] CATH LAB;  Service: Cardiovascular:  minimal LAD disease.  20-30% mid RCA disease with a right dominant system.  EF 65-70%. --False activation of ST elevation MI.  ? SHOULDER SURGERY Right 06/29/2021  ? TONSILLECTOMY    ? ? ?There were no vitals filed for this visit. ? ? ?Patient here for therapy today - too dizzy to participate and opted to return home.   ?Patient here with wife.   ?Patient allowed to sit quietly with ice pack and cool cloth on neck until symptoms subsided sufficiently for him to leave.   ?No OT visit/ no charge.   ? ? ? ? ? ? ? ? ? ? ? ? ? ? ? ? ? ? ? ? ? ? ? OT Short Term Goals - 09/28/21 1347   ? ?  ? OT SHORT TERM GOAL #1  ? Title I with initial HEP   ? Time 4   ? Period Weeks   ? Status Achieved   ? Target Date 09/29/21   ?  ? OT SHORT TERM GOAL #2  ? Title Pt will donn shirt consistently  with min A.   ? Time 4   ? Period Weeks   ? Status On-going   Pt is not performing due to shoulder pain  ?  ? OT SHORT TERM GOAL #3  ? Title Pt will complete bathing with min A.   ? Time 4   ? Period Weeks   ? Status Achieved   ?  ? OT SHORT TERM GOAL #4  ? Title Pt / caregiver will verbalize understanding of adapted strategies to increase independence with ADLs/IADLs and to minimize pain.( sitting for bathing, use of a schedule to assist with initation prn)   ? Time 4   ? Period Weeks   ? Status On-going   ?  ? OT SHORT TERM GOAL #5  ? Title Pt will increase bilateral grip strength by 5 lbs for increased ease with ADLs/ IADLs.   ? Baseline R 27.7, L 13.8   ? Time 4   ? Period Weeks   ? Status On-going   R 31.5, L 12.1  ?  ? OT SHORT TERM GOAL #6  ? Title Pt will decrease 9 hole peg test score by 3  secs bilaterally for increased ease with ADLs/IADLs.   ? Baseline R 44, L 39.12   ? Time 4   ? Period Weeks   ? Status On-going   RUE 38.63, LUE 41.90-met for RUE  ? ?  ?  ? ?  ? ? ? ? OT Long Term Goals - 09/28/21 1356   ? ?  ? OT LONG TERM GOAL #1  ? Title I with updated HEP.   ? Time 12   ? Period Weeks   ? Status On-going   ? Target Date 11/24/21   ?  ? OT LONG TERM GOAL #2  ? Title Pt will perform all basic ADLs at a modified independent level.   ? Time 12   ? Period Weeks   ? Status On-going   continues to require assit with UB dressing due to shoulder pain  ?  ? OT LONG TERM GOAL #3  ? Title Pt will demonstrate ability to retrieve a 3 lbs  object at 125* shoulder flexion with LUE  without dropping   ? Time 12   ? Period Weeks   ? Status On-going   ?  ? OT LONG TERM GOAL #4  ? Title Pt will perform simple cooking task with supervision demonstrating good safety awareness.   ? Time 12   ? Period Weeks   ? Status On-going   not addressed  ?  ? OT LONG TERM GOAL #5  ? Title Pt will perform basic home mangement tasks at a mod I level .   ? Time 12   ? Period Weeks   ? Status On-going   ? ?  ?  ? ?  ? ? ? ? ? ? ? ? ?  ?   ?  ? ? ?Visit Diagnosis: ?Dizziness and giddiness ? ? ? ?Problem List ?Patient Active Problem List  ? Diagnosis Date Noted  ? Dizziness and giddiness 02/03/2021  ? Cervical spondylosis without myelopathy 05/06/2020  ? Myalgia 04/22/2020  ? Sleep disturbance 04/05/2020  ? Neck pain 04/05/2020  ? Post concussive syndrome 03/22/2020  ? Nausea without vomiting 03/22/2020  ? Chest pain 08/19/2017  ? Hypertensive urgency 08/19/2017  ? Chest pain with moderate risk for cardiac etiology   ? ST elevation   ? Hypokalemia 09/23/2014  ? Abnormal EKG 09/23/2014  ? Hyperlipidemia 09/23/2014  ? Accelerated hypertension 09/21/2014  ? ? ?Mariah Milling, OT ?09/29/2021, 11:25 AM ? ?[facility] ?[place] ?[place], [place], [address] ?Phone: [phone]   Fax:  [phone] ? ?Name: Craig Hart ?MRN: [id number] ?Date of Birth: Oct 18, 1955 ? ?

## 2021-09-29 NOTE — Therapy (Signed)
[place] [facility] ?[address] ?[place], [place], [address] ?Phone: [phone]   Fax:  [phone] ? ?Occupational Therapy Treatment ? ?Patient Details  ?Name: Craig Hart ?MRN: [id number] ?Date of Birth: 1956-04-10 ?Referring Provider (OT): Dr. Riley KillSwartz ? ? ?Encounter Date: 09/28/2021 ? ? OT End of Session - 09/28/21 1328   ? ? Visit Number 8   ? Number of Visits 25   ? Date for OT Re-Evaluation 11/24/21   ? Authorization Type Worker's comp, 12 visits approved, state BCBS   ? Authorization - Visit Number 8   ? Authorization - Number of Visits 12   ? OT Start Time 1318   ? OT Stop Time 1400   ? OT Time Calculation (min) 42 min   ? ?  ?  ? ?  ? ? ?Past Medical History:  ?Diagnosis Date  ? Hypertension   ? Hypertensive emergency 09/21/2014  ? ? ?Past Surgical History:  ?Procedure Laterality Date  ? LEFT HEART CATHETERIZATION WITH CORONARY ANGIOGRAM N/A 09/21/2014  ? Procedure: LEFT HEART CATHETERIZATION WITH CORONARY ANGIOGRAM;  Surgeon: Tonny BollmanMichael Cooper, MD;  [facility];  Service: Cardiovascular:  minimal LAD disease.  20-30% mid RCA disease with a right dominant system.  EF 65-70%. --False activation of ST elevation MI.  ? SHOULDER SURGERY Right 06/29/2021  ? TONSILLECTOMY    ? ? ?There were no vitals filed for this visit. ? ? Subjective Assessment - 09/28/21 1324   ? ? 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 MRI report of brain was relatively unremarkable for head injury Cervical myelopathy,  09/21/20 to C3-4 discectomy with fusion , R shoulder surgery 06/29/21  - for torn tendon and removal of bone spurs on 06/29/21- receiving PT for R shoulder   ? Patient Stated Goals increase independence with ADLs/IADLS   ? ?  ?  ? ?  ? ? ? ? ? ? ? ? ? ? ? ? ?Treatment: Pt brought in his sleep diary. He ids going to bed around 11 pm  however he does not go to sleep until around 2 am. Discussion with pt/ wife about importance of reducing sleep/ rest period during the day and increasing activity  level so that pt can sleep at night. ?Therapist checked progress towards several short and long term goals, see goals. ?Discussion with pt/ wife that OT will likely take a break at the end of his 12 visits as pt has outside factors such as R shoulder  pain and limited use, that is being addressed by PT else where as well as pt vestibular symptoms. These factors impact pt's ability to progress in OT. ? ? ? ? ? ? ? ? ? ? OT Treatment/Education - 09/28/21 1418   ? ? Education Details Therapist reviewed putty exercises from HEP for left and right UE's, Therapist issued blank schedule for pt to fill out with daily activities including rest periods in an attempt to increase pt awareness of activity level. Based upon these schedules OT can further address activity.  ? Methods Explanation;Demonstration;Verbal cues   ? Comprehension Verbalized understanding;Returned demonstration;Verbal cues required   ? ?  ?  ? ?  ? ? ? OT Short Term Goals - 09/28/21 1347   ? ?  ? OT SHORT TERM GOAL #1  ? Title I with initial HEP   ? Time 4   ? Period Weeks   ?  Status Achieved   ? Target Date 09/29/21   ?  ? OT SHORT TERM GOAL #2  ? Title Pt will donn shirt consistently with min A.   ? Time 4   ? Period Weeks   ? Status On-going   Pt is not performing due to shoulder pain  ?  ? OT SHORT TERM GOAL #3  ? Title Pt will complete bathing with min A.   ? Time 4   ? Period Weeks   ? Status Achieved   ?  ? OT SHORT TERM GOAL #4  ? Title Pt / caregiver will verbalize understanding of adapted strategies to increase independence with ADLs/IADLs and to minimize pain.( sitting for bathing, use of a schedule to assist with initation prn)   ? Time 4   ? Period Weeks   ? Status On-going   ?  ? OT SHORT TERM GOAL #5  ? Title Pt will increase bilateral grip strength by 5 lbs for increased ease  with ADLs/ IADLs.   ? Time 4   ? Period Weeks   ? Status On-going   R 31.5, L 12.1  ?  ? OT SHORT TERM GOAL #6  ? Title Pt will decrease 9 hole peg test score by 3 secs bilaterally for increased ease with ADLs/IADLs.   ? Time 4   ? Period Weeks   ? Status On-going   RUE 38.63, LUE  ? ?  ?  ? ?  ? ? ? ? OT Long Term Goals - 09/28/21 1356   ? ?  ? OT LONG TERM GOAL #1  ? Title I with updated HEP.   ? Time 12   ? Period Weeks   ? Status On-going   ? Target Date 11/24/21   ?  ? OT LONG TERM GOAL #2  ? Title Pt will perform all basic ADLs at a modified independent level.   ? Time 12   ? Period Weeks   ? Status On-going   continues to require assit with UB dressing due to shoulder pain  ?  ? OT LONG TERM GOAL #3  ? Title Pt will demonstrate ability to retrieve a 3 lbs  object at 125* shoulder flexion with LUE  without dropping   ? Time 12   ? Period Weeks   ? Status On-going   ?  ? OT LONG TERM GOAL #4  ? Title Pt will perform simple cooking task with supervision demonstrating good safety awareness.   ? Time 12   ? Period Weeks   ? Status On-going   not addressed  ?  ? OT LONG TERM GOAL #5  ? Title Pt will perform basic home mangement tasks at a mod I level .   ? Time 12   ? Period Weeks   ? Status On-going   ? ?  ?  ? ?  ? ? ? ? ? ? ? ? Plan - 09/28/21 1413   ? ? Clinical Impression Statement Pt is progressing slowly towards goals. He remains limited by pain, dizziness and nausea. Pt was able to tolerate regular lighting today during treatment in a quiet room.   ? OT Occupational Profile and History Detailed Assessment- Review of Records and additional review of physical, cognitive, psychosocial history related to current functional performance   ? Occupational performance deficits (Please refer to evaluation for details): ADL's;IADL's;Rest and Sleep;Work;Leisure;Social Participation   ? Body Structure / Function / Physical Skills ADL;Balance;Endurance;Mobility;Strength;Flexibility;UE functional  use;FMC;Pain;Gait;Coordination;ROM;GMC;Decreased  knowledge of precautions;Decreased knowledge of use of DME;Dexterity;IADL;Vision   ? Cognitive Skills Attention;Energy/Drive;Learn;Memory;Problem Solve;Safety Awareness;Temperament/Personality;Thought;Understand   ? Rehab Potential Good   ? Clinical Decision Making Limited treatment options, no task modification necessary   ? Comorbidities Affecting Occupational Performance: May have comorbidities impacting occupational performance   ? Modification or Assistance to Complete Evaluation  Min-Moderate modification of tasks or assist with assess necessary to complete eval   ? OT Frequency 2x / week   ? OT Duration 12 weeks   ? OT Treatment/Interventions Self-care/ADL training;Therapeutic exercise;Functional Mobility Training;Balance training;Splinting;Manual Therapy;Neuromuscular education;Ultrasound;Aquatic Therapy;Therapeutic activities;Cognitive remediation/compensation;DME and/or AE instruction;Paraffin;Cryotherapy;Electrical Stimulation;Visual/perceptual remediation/compensation;Fluidtherapy;Moist Heat;Passive range of motion;Patient/family education;Energy conservation   ? Plan check to see if pt has started filling out schedule of daily activities, issue exercises for L shoulder Flexion, consider supine? check to see if pt made tea as discussed at home as we discussed last week and check to see what activities pt is perofrming for home management   ? Recommended Other Services PT is addressing RUE shoulder s/p surgery, OT will address shoulder only in a functional context   ? Consulted and Agree with Plan of Care Patient   ? ?  ?  ? ?  ? ? ?Patient will benefit from skilled therapeutic intervention in order to improve the following deficits and impairments:   ?Body Structure / Function / Physical Skills: ADL, Balance, Endurance, Mobility, Strength, Flexibility, UE functional use, FMC, Pain, Gait, Coordination, ROM, GMC, Decreased knowledge of precautions, Decreased  knowledge of use of DME, Dexterity, IADL, Vision ?Cognitive Skills: Attention, Energy/Drive, Learn, Memory, Problem Solve, Safety Awareness, Temperament/Personality, Thought, Understand ?  ? ? ?Visit Diagnosis: ?Muscle weakness (gen

## 2021-09-30 ENCOUNTER — Ambulatory Visit: Payer: No Typology Code available for payment source

## 2021-09-30 VITALS — BP 143/99 | HR 68

## 2021-09-30 DIAGNOSIS — R42 Dizziness and giddiness: Secondary | ICD-10-CM

## 2021-09-30 DIAGNOSIS — M542 Cervicalgia: Secondary | ICD-10-CM

## 2021-09-30 DIAGNOSIS — M6281 Muscle weakness (generalized): Secondary | ICD-10-CM

## 2021-09-30 DIAGNOSIS — R262 Difficulty in walking, not elsewhere classified: Secondary | ICD-10-CM

## 2021-09-30 DIAGNOSIS — R2681 Unsteadiness on feet: Secondary | ICD-10-CM
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
## 2021-10-03 ENCOUNTER — Ambulatory Visit: Payer: No Typology Code available for payment source | Admitting: Occupational Therapy

## 2021-10-03 ENCOUNTER — Encounter: Payer: Self-pay | Admitting: Occupational Therapy

## 2021-10-03 DIAGNOSIS — M6281 Muscle weakness (generalized): Secondary | ICD-10-CM

## 2021-10-03 DIAGNOSIS — R2681 Unsteadiness on feet: Secondary | ICD-10-CM

## 2021-10-03 DIAGNOSIS — R278 Other lack of coordination: Secondary | ICD-10-CM

## 2021-10-03 DIAGNOSIS — M25612 Stiffness of left shoulder, not elsewhere classified: Secondary | ICD-10-CM

## 2021-10-03 DIAGNOSIS — R41841 Cognitive communication deficit: Secondary | ICD-10-CM | POA: Diagnosis not present

## 2021-10-03 DIAGNOSIS — M25611 Stiffness of right shoulder, not elsewhere classified: Secondary | ICD-10-CM

## 2021-10-03 NOTE — Patient Instructions (Signed)
Things to do daily and/or weekly:  ? ?Unload dishwasher - hold onto counter w/ one hand, avoid extreme head turns  ?Make snack/sandwich/hot chocolate ?Change out laundry ?Short walks along driveway (level grass) ?Take out trash  ?Try going with wife to grocery store for only 5 items or less (low crowd times) ?Continue to do putty and shoulder exercises ?Vision exercises ? ?*Try to limit amount naps during the day and avoid napping in later afternoon. Try to read as opposed to watching TV (especially action packed TV) right before bedtime. Continue to monitor sleep ? ? ? ?

## 2021-10-03 NOTE — Therapy (Signed)
[facility] ?[place], [place], [address] ?Phone: [phone]   Fax:  [phone] ? ?Occupational Therapy Treatment ? ?Patient Details  ?Name: Craig Hart ?MRN: [id number] ?Date of Birth: 12-Apr-1956 ?Referring Provider (OT): Dr. Naaman Plummer ? ? ?Encounter Date: 10/03/2021 ? ? OT End of Session - 10/03/21 1119   ? ? Visit Number 9   ? Number of Visits 25   ? Date for OT Re-Evaluation 11/24/21   ? Authorization Type Worker's comp, 12 visits approved, state [employer]   ? Authorization - Visit Number 9   ? Authorization - Number of Visits 12   ? OT Start Time 1015   ? OT Stop Time 1103   ? OT Time Calculation (min) 48 min   ? Activity Tolerance Treatment limited secondary to medical complications (Comment)   ? ?  ?  ? ?  ? ? ?Past Medical History:  ?Diagnosis Date  ? Hypertension   ? Hypertensive emergency 09/21/2014  ? ? ?Past Surgical History:  ?Procedure Laterality Date  ? LEFT HEART CATHETERIZATION WITH CORONARY ANGIOGRAM N/A 09/21/2014  ? Procedure: LEFT HEART CATHETERIZATION WITH CORONARY ANGIOGRAM;  Surgeon: Sherren Mocha, MD;  Location: [facility] CATH LAB;  Service: Cardiovascular:  minimal LAD disease.  20-30% mid RCA disease with a right dominant system.  EF 65-70%. --False activation of ST elevation MI.  ? SHOULDER SURGERY Right 06/29/2021  ? TONSILLECTOMY    ? ? ?There were no vitals filed for this visit. ? ? Subjective Assessment - 10/03/21 1018   ? ? Subjective  I have been sleeping a little better. Waking up on average about 2x/night. I've been putting on my pants. (Wife also reports he donned shirt today)   ? 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 MRI report of brain was relatively unremarkable for head injury Cervical myelopathy,  09/21/20 to C3-4 discectomy with fusion , R shoulder surgery 06/29/21  - for torn tendon  and removal of bone spurs on 06/29/21- receiving PT for R shoulder   ? Patient Stated Goals increase independence with ADLs/IADLS   ? Currently in Pain? Yes   ? Pain Score 7    ? Pain Location Shoulder   and neck  ? Pain Orientation Right   ? Pain Descriptors / Indicators Aching   ? Pain Type Chronic pain   ? Pain Onset More than a month ago   ? Pain Frequency Constant   ? Aggravating Factors  use   ? Pain Relieving Factors rest, ice   ? ?  ?  ? ?  ? ?Pt brought in sleep diary - pt appears to be waking up on avg 2x/night. Pt going to bed b/t 11:00 pm and 1:00 am. Reviewed/discussed ways to increase sleep at night, however pt does report this has been going better recently.  ? ?Pt brought in daily activity however only things filled out was watching TV and napping. Discussed limiting naps during day and avoiding late afternoon naps to promote better sleeping patterns at night. Pt/therapist discussed activities and HEP's to do daily and/or weekly and made list today. Encouraged pt to write these down in daily activities (see pt instructions).  ? ?Discussed ways to monitor progress including: time doing activity before resting, level of dizziness, time to recover from dizziness, etc.  ? ?Also discussed shoulder ex's pt is doing with P.T. - pt reports doing one  supine and one wall slide with BUE's - therefore pt is getting some ex with Lt shoulder as well.  ? ?Reviewed vision HEP and ways to grade activity w/ bigger scanning once tolerated.  ? ? ? ? ? ? ? ? ? ? ? ? ? ? ? ? ? ? ? ? ? OT Education - 10/03/21 1055   ? ? Education Details activities to do daily/weekly and how to grade activity and monitor progress   ? Person(s) Educated Patient;Spouse   ? Methods Explanation;Demonstration;Verbal cues;Handout   ? Comprehension Verbalized understanding   ? ?  ?  ? ?  ? ? ? OT Short Term Goals - 09/28/21 1347   ? ?  ? OT SHORT TERM GOAL #1  ? Title I with initial HEP   ? Time 4   ? Period Weeks   ? Status Achieved   ? Target  Date 09/29/21   ?  ? OT SHORT TERM GOAL #2  ? Title Pt will donn shirt consistently with min A.   ? Time 4   ? Period Weeks   ? Status On-going   Pt is not performing due to shoulder pain  ?  ? OT SHORT TERM GOAL #3  ? Title Pt will complete bathing with min A.   ? Time 4   ? Period Weeks   ? Status Achieved   ?  ? OT SHORT TERM GOAL #4  ? Title Pt / caregiver will verbalize understanding of adapted strategies to increase independence with ADLs/IADLs and to minimize pain.( sitting for bathing, use of a schedule to assist with initation prn)   ? Time 4   ? Period Weeks   ? Status On-going   ?  ? OT SHORT TERM GOAL #5  ? Title Pt will increase bilateral grip strength by 5 lbs for increased ease with ADLs/ IADLs.   ? Baseline R 27.7, L 13.8   ? Time 4   ? Period Weeks   ? Status On-going   R 31.5, L 12.1  ?  ? OT SHORT TERM GOAL #6  ? Title Pt will decrease 9 hole peg test score by 3 secs bilaterally for increased ease with ADLs/IADLs.   ? Baseline R 44, L 39.12   ? Time 4   ? Period Weeks   ? Status On-going   RUE 38.63, LUE 41.90-met for RUE  ? ?  ?  ? ?  ? ? ? ? OT Long Term Goals - 09/28/21 1356   ? ?  ? OT LONG TERM GOAL #1  ? Title I with updated HEP.   ? Time 12   ? Period Weeks   ? Status On-going   ? Target Date 11/24/21   ?  ? OT LONG TERM GOAL #2  ? Title Pt will perform all basic ADLs at a modified independent level.   ? Time 12   ? Period Weeks   ? Status On-going   continues to require assit with UB dressing due to shoulder pain  ?  ? OT LONG TERM GOAL #3  ? Title Pt will demonstrate ability to retrieve a 3 lbs  object at 125* shoulder flexion with LUE  without dropping   ? Time 12   ? Period Weeks   ? Status On-going   ?  ? OT LONG TERM GOAL #4  ? Title Pt will perform simple cooking task with supervision demonstrating good safety awareness.   ? Time 12   ?  Period Weeks   ? Status On-going   not addressed  ?  ? OT LONG TERM GOAL #5  ? Title Pt will perform basic home mangement tasks at a mod I level .    ? Time 12   ? Period Weeks   ? Status On-going   ? ?  ?  ? ?  ? ? ? ? ? ? ? ? Plan - 10/03/21 1117   ? ? Clinical Impression Statement Pt is progressing slowly towards goals. He remains limited by pain, dizziness and nausea. Pt was able to tolerate regular lighting today during treatment in a quiet room. Pt also reports sleeping better recently   ? OT Occupational Profile and History Detailed Assessment- Review of Records and additional review of physical, cognitive, psychosocial history related to current functional performance   ? Occupational performance deficits (Please refer to evaluation for details): ADL's;IADL's;Rest and Sleep;Work;Leisure;Social Participation   ? Body Structure / Function / Physical Skills ADL;Balance;Endurance;Mobility;Strength;Flexibility;UE functional use;FMC;Pain;Gait;Coordination;ROM;GMC;Decreased knowledge of precautions;Decreased knowledge of use of DME;Dexterity;IADL;Vision   ? Cognitive Skills Attention;Energy/Drive;Learn;Memory;Problem Solve;Safety Awareness;Temperament/Personality;Thought;Understand   ? Rehab Potential Good   ? Clinical Decision Making Limited treatment options, no task modification necessary   ? Comorbidities Affecting Occupational Performance: May have comorbidities impacting occupational performance   ? Modification or Assistance to Complete Evaluation  Min-Moderate modification of tasks or assist with assess necessary to complete eval   ? OT Frequency 2x / week   ? OT Duration 12 weeks   ? OT Treatment/Interventions Self-care/ADL training;Therapeutic exercise;Functional Mobility Training;Balance training;Splinting;Manual Therapy;Neuromuscular education;Ultrasound;Aquatic Therapy;Therapeutic activities;Cognitive remediation/compensation;DME and/or AE instruction;Paraffin;Cryotherapy;Electrical Stimulation;Visual/perceptual remediation/compensation;Fluidtherapy;Moist Heat;Passive range of motion;Patient/family education;Energy conservation   ? Plan check on  updated schedule and sleep diary, continue to reinforce ADLS and functional tasks   ? Recommended Other Services PT is addressing RUE shoulder s/p surgery, OT will address shoulder only in a functional co

## 2021-10-05 ENCOUNTER — Ambulatory Visit: Payer: No Typology Code available for payment source

## 2021-10-05 ENCOUNTER — Ambulatory Visit: Payer: No Typology Code available for payment source | Admitting: Speech Pathology

## 2021-10-05 VITALS — BP 157/99 | HR 71

## 2021-10-05 DIAGNOSIS — M542 Cervicalgia: Secondary | ICD-10-CM

## 2021-10-05 DIAGNOSIS — R2681 Unsteadiness on feet: Secondary | ICD-10-CM

## 2021-10-05 DIAGNOSIS — M6281 Muscle weakness (generalized): Secondary | ICD-10-CM | POA: Diagnosis not present

## 2021-10-05 DIAGNOSIS — R42 Dizziness and giddiness: Secondary | ICD-10-CM

## 2021-10-05 DIAGNOSIS — G44329 Chronic post-traumatic headache, not intractable: Secondary | ICD-10-CM
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
## 2021-10-06 ENCOUNTER — Encounter: Payer: Self-pay | Admitting: Occupational Therapy

## 2021-10-06 ENCOUNTER — Ambulatory Visit: Payer: No Typology Code available for payment source | Admitting: Occupational Therapy

## 2021-10-06 DIAGNOSIS — M25612 Stiffness of left shoulder, not elsewhere classified: Secondary | ICD-10-CM

## 2021-10-06 DIAGNOSIS — M25611 Stiffness of right shoulder, not elsewhere classified: Secondary | ICD-10-CM

## 2021-10-06 DIAGNOSIS — R41841 Cognitive communication deficit: Secondary | ICD-10-CM | POA: Diagnosis not present

## 2021-10-06 DIAGNOSIS — R278 Other lack of coordination: Secondary | ICD-10-CM

## 2021-10-06 DIAGNOSIS — R41844 Frontal lobe and executive function deficit: Secondary | ICD-10-CM

## 2021-10-06 DIAGNOSIS — M6281 Muscle weakness (generalized): Secondary | ICD-10-CM

## 2021-10-06 DIAGNOSIS — R2681 Unsteadiness on feet: Secondary | ICD-10-CM

## 2021-10-06 NOTE — Therapy (Signed)
[place] [facility] ?[place], [place], [address] ?Phone: [phone]   Fax:  [phone] ? ?Occupational Therapy Treatment ? ?Patient Details  ?Name: Craig Hart ?MRN: [id number] ?Date of Birth: 1955-08-25 ?Referring Provider (OT): Dr. Naaman Plummer ? ? ?Encounter Date: 10/06/2021 ? ? OT End of Session - 10/06/21 1556   ? ? Visit Number 10   ? Number of Visits 25   ? Date for OT Re-Evaluation 11/24/21   ? Authorization Type Worker's comp, 12 visits approved, state [employer]   ? Authorization - Visit Number 10  ? Authorization - Number of Visits 12   ? OT Start Time 1450   ? OT Stop Time 1530   ? OT Time Calculation (min) 40 min   ? Activity Tolerance Patient tolerated treatment well   ? Behavior During Therapy [facility] for tasks assessed/performed   ? ?  ?  ? ?  ? ? ?Past Medical History:  ?Diagnosis Date  ? Hypertension   ? Hypertensive emergency 09/21/2014  ? ? ?Past Surgical History:  ?Procedure Laterality Date  ? LEFT HEART CATHETERIZATION WITH CORONARY ANGIOGRAM N/A 09/21/2014  ? Procedure: LEFT HEART CATHETERIZATION WITH CORONARY ANGIOGRAM;  Surgeon: Sherren Mocha, MD;  Location: [facility] CATH LAB;  Service: Cardiovascular:  minimal LAD disease.  20-30% mid RCA disease with a right dominant system.  EF 65-70%. --False activation of ST elevation MI.  ? SHOULDER SURGERY Right 06/29/2021  ? TONSILLECTOMY    ? ? ?There were no vitals filed for this visit. ? ? ? ? ? ? ? ? ? ? ? ? ? ? ? ? OT Treatments/Exercises (OP) - 10/06/21 0001   ? ?  ? Cognitive Exercises  ? Other Cognitive Exercises 1 Patient treated today in therapy gym.  Patient with sunglasses on, but able to complete entire session in brightly lit, mildly distracting environment.  Patient completed MVPT with 100% accuracy.  Did report nausea up to 4/10 but settled with brief rest break.  Did Trail making test - patient made two errors without awareness, although able to self correct once his attention drawn to  error.  Patient reported mild increase in anxiety with treatment in gym space - but this did not impact performance, and anxiety subsided with continued time and praise.   ? ?  ?  ? ?  ? ? ? ? ? ? ? ? ? ? ? OT Short Term Goals - 10/06/21 1559   ? ?  ? OT SHORT TERM GOAL #1  ? Title I with initial HEP   ? Time 4   ? Period Weeks   ? Status Achieved   ? Target Date 09/29/21   ?  ? OT SHORT TERM GOAL #2  ? Title Pt will donn shirt consistently with min A.   ? Time 4   ? Period Weeks   ? Status On-going   Pt is not performing due to shoulder pain  ?  ? OT SHORT TERM GOAL #3  ? Title Pt will complete bathing with min A.   ? Time 4   ? Period Weeks   ? Status Achieved   ?  ? OT SHORT TERM GOAL #4  ? Title Pt / caregiver will verbalize understanding of adapted strategies to increase independence with ADLs/IADLs and to minimize pain.( sitting for bathing, use of a schedule to assist with initation prn)   ? Time 4   ? Period Weeks   ? Status On-going   ?  ?  OT SHORT TERM GOAL #5  ? Title Pt will increase bilateral grip strength by 5 lbs for increased ease with ADLs/ IADLs.   ? Baseline R 27.7, L 13.8   ? Time 4   ? Period Weeks   ? Status On-going   R 31.5, L 12.1  ?  ? OT SHORT TERM GOAL #6  ? Title Pt will decrease 9 hole peg test score by 3 secs bilaterally for increased ease with ADLs/IADLs.   ? Baseline R 44, L 39.12   ? Time 4   ? Period Weeks   ? Status On-going   RUE 38.63, LUE 41.90-met for RUE  ? ?  ?  ? ?  ? ? ? ? OT Long Term Goals - 10/06/21 1559   ? ?  ? OT LONG TERM GOAL #1  ? Title I with updated HEP.   ? Time 12   ? Period Weeks   ? Status On-going   ? Target Date 11/24/21   ?  ? OT LONG TERM GOAL #2  ? Title Pt will perform all basic ADLs at a modified independent level.   ? Time 12   ? Period Weeks   ? Status On-going   continues to require assit with UB dressing due to shoulder pain  ?  ? OT LONG TERM GOAL #3  ? Title Pt will demonstrate ability to retrieve a 3 lbs  object at 125* shoulder flexion with  LUE  without dropping   ? Time 12   ? Period Weeks   ? Status On-going   ?  ? OT LONG TERM GOAL #4  ? Title Pt will perform simple cooking task with supervision demonstrating good safety awareness.   ? Time 12   ? Period Weeks   ? Status On-going   not addressed  ?  ? OT LONG TERM GOAL #5  ? Title Pt will perform basic home mangement tasks at a mod I level .   ? Time 12   ? Period Weeks   ? Status On-going   ? ?  ?  ? ?  ? ? ? ? ? ? ? ? Plan - 10/06/21 1557   ? ? Clinical Impression Statement Patient able to tolerate treatment in gym today.  Patient actually laughing at times during today's session.  Anxiety and Nausea continue to be limiting factors although patient able to be mentally stimulated/challenged in distracting bright environment without significant increase in symptoms.   ? OT Occupational Profile and History Detailed Assessment- Review of Records and additional review of physical, cognitive, psychosocial history related to current functional performance   ? Occupational performance deficits (Please refer to evaluation for details): ADL's;IADL's;Rest and Sleep;Work;Leisure;Social Participation   ? Body Structure / Function / Physical Skills ADL;Balance;Endurance;Mobility;Strength;Flexibility;UE functional use;FMC;Pain;Gait;Coordination;ROM;GMC;Decreased knowledge of precautions;Decreased knowledge of use of DME;Dexterity;IADL;Vision   ? Cognitive Skills Attention;Energy/Drive;Learn;Memory;Problem Solve;Safety Awareness;Temperament/Personality;Thought;Understand   ? Rehab Potential Good   ? Clinical Decision Making Limited treatment options, no task modification necessary   ? Comorbidities Affecting Occupational Performance: May have comorbidities impacting occupational performance   ? Modification or Assistance to Complete Evaluation  Min-Moderate modification of tasks or assist with assess necessary to complete eval   ? OT Frequency 2x / week   ? OT Duration 12 weeks   ? OT Treatment/Interventions  Self-care/ADL training;Therapeutic exercise;Functional Mobility Training;Balance training;Splinting;Manual Therapy;Neuromuscular education;Ultrasound;Aquatic Therapy;Therapeutic activities;Cognitive remediation/compensation;DME and/or AE instruction;Paraffin;Cryotherapy;Electrical Stimulation;Visual/perceptual remediation/compensation;Fluidtherapy;Moist Heat;Passive range of motion;Patient/family education;Energy conservation   ? Plan check on  updated schedule and sleep diary, continue to reinforce ADLS and functional tasks   ? Recommended Other Services PT is addressing RUE shoulder s/p surgery, OT will address shoulder only in a functional context   ? Consulted and Agree with Plan of Care Patient   ? ?  ?  ? ?  ? ? ?Patient will benefit from skilled therapeutic intervention in order to improve the following deficits and impairments:   ?Body Structure / Function / Physical Skills: ADL, Balance, Endurance, Mobility, Strength, Flexibility, UE functional use, FMC, Pain, Gait, Coordination, ROM, GMC, Decreased knowledge of precautions, Decreased knowledge of use of DME, Dexterity, IADL, Vision ?Cognitive Skills: Attention, Energy/Drive, Learn, Memory, Problem Solve, Safety Awareness, Temperament/Personality, Thought, Understand ?  ? ? ?Visit Diagnosis: ?Unsteadiness on feet ? ?Muscle weakness (generalized) ? ?Other lack of coordination ? ?Stiffness of right shoulder, not elsewhere classified ? ?Stiffness of left shoulder, not elsewhere classified ? ?Frontal lobe and executive function deficit ? ? ? ?Problem List ?Patient Active Problem List  ? Diagnosis Date Noted  ? Dizziness and giddiness 02/03/2021  ? Cervical spondylosis without myelopathy 05/06/2020  ? Myalgia 04/22/2020  ? Sleep disturbance 04/05/2020  ? Neck pain 04/05/2020  ? Post concussive syndrome 03/22/2020  ? Nausea without vomiting 03/22/2020  ? Chest pain 08/19/2017  ? Hypertensive urgency 08/19/2017  ? Chest pain with moderate risk for cardiac etiology    ? ST elevation   ? Hypokalemia 09/23/2014  ? Abnormal EKG 09/23/2014  ? Hyperlipidemia 09/23/2014  ? Accelerated hypertension 09/21/2014  ? ? ?Mariah Milling, OT ?10/06/2021, 4:03 PM ? ?[place] ?O

## 2021-10-07 ENCOUNTER — Ambulatory Visit: Payer: No Typology Code available for payment source

## 2021-10-07 VITALS — BP 160/96 | HR 75

## 2021-10-07 DIAGNOSIS — R42 Dizziness and giddiness: Secondary | ICD-10-CM

## 2021-10-07 DIAGNOSIS — R2681 Unsteadiness on feet: Secondary | ICD-10-CM

## 2021-10-07 DIAGNOSIS — M542 Cervicalgia: Secondary | ICD-10-CM

## 2021-10-07 DIAGNOSIS — M6281 Muscle weakness (generalized): Secondary | ICD-10-CM | POA: Diagnosis not present
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
## 2021-10-07 NOTE — Patient Instructions (Signed)
Gaze Stabilization: Sitting ? ? ? ?Keeping eyes on target on wall 3-4 feet away, tilt head down 15-30? and move head side to side for 20 seconds. Repeat while moving head up and down for 20 seconds. ?Do 2-3 sessions per day. ? ? ? ? ?Gaze Stabilization: Tip Card ? ?1.Target must remain in focus, not blurry, and appear stationary while head is in motion. ?2.Perform exercises with small head movements (45? to either side of midline). ?3.Increase speed of head motion so long as target is in focus. ?4.If you wear eyeglasses, be sure you can see target through lens (therapist will give specific instructions for bifocal / progressive lenses). ?5.These exercises may provoke dizziness or nausea. Work through these symptoms. If too dizzy, slow head movement slightly. Rest between each exercise. ?6.Exercises demand concentration; avoid distractions. ?7.For safety, perform standing exercises close to a counter, wall, corner, or next to someone. ?

## 2021-10-10 ENCOUNTER — Ambulatory Visit: Payer: No Typology Code available for payment source | Admitting: Occupational Therapy

## 2021-10-10 ENCOUNTER — Ambulatory Visit: Payer: No Typology Code available for payment source

## 2021-10-10 VITALS — BP 136/93 | HR 72

## 2021-10-10 DIAGNOSIS — R42 Dizziness and giddiness: Secondary | ICD-10-CM

## 2021-10-10 DIAGNOSIS — R2681 Unsteadiness on feet: Secondary | ICD-10-CM

## 2021-10-10 DIAGNOSIS — R278 Other lack of coordination: Secondary | ICD-10-CM

## 2021-10-10 DIAGNOSIS — M6281 Muscle weakness (generalized): Secondary | ICD-10-CM

## 2021-10-10 DIAGNOSIS — R41841 Cognitive communication deficit: Secondary | ICD-10-CM | POA: Diagnosis not present

## 2021-10-10 DIAGNOSIS — M542 Cervicalgia: Secondary | ICD-10-CM

## 2021-10-10 NOTE — Therapy (Signed)
[facility] ?[address] ?[place], [place], [address] ?Phone: [phone]   Fax:  [phone] ? ?Patient Details  ?Name: Craig Hart ?MRN: [id number] ?Date of Birth: 1955-10-06 ?Referring Provider:  Ranelle Oyster, MD ? ?Encounter Date: 10/10/2021 ?Pt was feeling too poorly after PT to participate in OT. Ice pack applied to pt's neck and room was darkened. Pt left with his wife when dizziness, improved. Therapist discussed plans to take a brek from OT after next 2 visits as pt remains very limited by dizziness, nausea and shoulder pain which is being addressed by PT. ? ?[name], OT ?10/10/2021, 1:34 PM ? ? ?[facility] ?[address] ?[place], [place], [address] ?Phone: [phone]   Fax:  [phone] ?
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
## 2021-10-12 ENCOUNTER — Ambulatory Visit: Payer: No Typology Code available for payment source

## 2021-10-12 ENCOUNTER — Ambulatory Visit: Payer: No Typology Code available for payment source | Admitting: Occupational Therapy

## 2021-10-12 VITALS — BP 147/90 | HR 84

## 2021-10-12 DIAGNOSIS — R42 Dizziness and giddiness: Secondary | ICD-10-CM

## 2021-10-12 DIAGNOSIS — M542 Cervicalgia: Secondary | ICD-10-CM

## 2021-10-12 DIAGNOSIS — M6281 Muscle weakness (generalized): Secondary | ICD-10-CM | POA: Diagnosis not present

## 2021-10-12 DIAGNOSIS — R2681 Unsteadiness on feet: Secondary | ICD-10-CM
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
## 2021-10-18 ENCOUNTER — Ambulatory Visit: Payer: No Typology Code available for payment source

## 2021-10-18 ENCOUNTER — Ambulatory Visit
Payer: No Typology Code available for payment source | Attending: Physical Medicine & Rehabilitation | Admitting: Occupational Therapy

## 2021-10-18 ENCOUNTER — Encounter: Payer: Self-pay | Admitting: Occupational Therapy

## 2021-10-18 VITALS — BP 152/94 | HR 75

## 2021-10-18 DIAGNOSIS — R278 Other lack of coordination: Secondary | ICD-10-CM | POA: Insufficient documentation

## 2021-10-18 DIAGNOSIS — R2681 Unsteadiness on feet: Secondary | ICD-10-CM | POA: Insufficient documentation

## 2021-10-18 DIAGNOSIS — R41844 Frontal lobe and executive function deficit: Secondary | ICD-10-CM | POA: Diagnosis present

## 2021-10-18 DIAGNOSIS — M6281 Muscle weakness (generalized): Secondary | ICD-10-CM | POA: Diagnosis present

## 2021-10-18 DIAGNOSIS — R42 Dizziness and giddiness: Secondary | ICD-10-CM | POA: Diagnosis present

## 2021-10-18 DIAGNOSIS — M542 Cervicalgia: Secondary | ICD-10-CM | POA: Diagnosis present

## 2021-10-18 DIAGNOSIS — M25612 Stiffness of left shoulder, not elsewhere classified: Secondary | ICD-10-CM | POA: Diagnosis present

## 2021-10-18 DIAGNOSIS — M25611 Stiffness of right shoulder, not elsewhere classified: Secondary | ICD-10-CM | POA: Insufficient documentation

## 2021-10-18 NOTE — Therapy (Signed)
[facility] ?[place], [place], [address] ?Phone: [phone]   Fax:  [phone] ? ?Occupational Therapy Treatment ? ?Patient Details  ?Name: Craig Hart ?MRN: [id number] ?Date of Birth: 29-Jul-1955 ?Referring Provider (OT): Dr. Naaman Plummer ? ? ?Encounter Date: 10/18/2021 ? ? OT End of Session - 10/18/21 1432   ? ? Visit Number 11   ? Number of Visits 25   ? Date for OT Re-Evaluation 11/24/21   ? Authorization Type Worker's comp, 12 visits approved, state [employer]   ? Authorization - Visit Number 11   ? Authorization - Number of Visits 12   ? OT Start Time 1405   ? OT Stop Time 6333   ? OT Time Calculation (min) 40 min   ? Activity Tolerance Patient tolerated treatment well   ? Behavior During Therapy [facility] for tasks assessed/performed   ? ?  ?  ? ?  ? ? ?Past Medical History:  ?Diagnosis Date  ? Hypertension   ? Hypertensive emergency 09/21/2014  ? ? ?Past Surgical History:  ?Procedure Laterality Date  ? LEFT HEART CATHETERIZATION WITH CORONARY ANGIOGRAM N/A 09/21/2014  ? Procedure: LEFT HEART CATHETERIZATION WITH CORONARY ANGIOGRAM;  Surgeon: Sherren Mocha, MD;  Location: [facility] CATH LAB;  Service: Cardiovascular:  minimal LAD disease.  20-30% mid RCA disease with a right dominant system.  EF 65-70%. --False activation of ST elevation MI.  ? SHOULDER SURGERY Right 06/29/2021  ? TONSILLECTOMY    ? ? ?There were no vitals filed for this visit. ? ? Subjective Assessment - 10/18/21 1406   ? ? Subjective  Pt reports shoulder pain   ? 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 MRI report of brain was relatively unremarkable for head injury Cervical myelopathy,  09/21/20 to C3-4 discectomy with fusion , R shoulder surgery 06/29/21  - for torn tendon and removal of bone spurs on 06/29/21- receiving PT for R shoulder   ? Limitations Goes by  "[name]"   ? Patient Stated Goals increase independence with ADLs/IADLS   ? Currently in Pain? Yes   ? Pain Score 7    ? Pain Location Shoulder   ? Pain Orientation Right   ? Pain Descriptors / Indicators Aching   ? Pain Type Chronic pain   ? Pain Onset More than a month ago   ? Pain Frequency Constant   ? Aggravating Factors  use   ? Pain Relieving Factors rest, ice   ? ?  ?  ? ?  ? ? ? ? ? ? ? ? ? ? ? ?Treatment: supine unilateral shoulder flexion for LUE,(issued as HEP)  attempted triceps extension however this was discontinued due to clicking at elbow.  ?Unilateral wall slides with towel underhand for LUE, issued as HEP. Pt returned demonstration . ?Pt completed 12 piece puzzle in the gym with regular lighting with good performance. ?Tabletop scanning number cancellation with only 2 errors, ?Constant therapy for symbol matching and alternating symbol matching, min difficulty / errors, for scanning and attention to detail. ? ? ? ? ? ? ? ? ? ? ? OT Education - 10/18/21 1507   ? ? Education Details unilateral shoulder flexion, wall slides LUE   ? Person(s) Educated Patient   ? Methods Explanation;Demonstration;Verbal cues;Handout   ? Comprehension Verbalized understanding;Returned demonstration;Verbal cues required   ? ?  ?  ? ?  ? ? ?  OT Short Term Goals - 10/18/21 1408   ? ?  ? OT SHORT TERM GOAL #1  ? Title I with initial HEP   ? Time 4   ? Period Weeks   ? Status Achieved   ? Target Date 09/29/21   ?  ? OT SHORT TERM GOAL #2  ? Title Pt will donn shirt consistently with min A.   ? Time 4   ? Period Weeks   ? Status Partially Met   Pt is not performing due to shoulder pain, 40% with pullover, pt donns button front Ily  ?  ? OT SHORT TERM GOAL #3  ? Title Pt will complete bathing with min A.   ? Time 4   ? Period Weeks   ? Status Achieved   ?  ? OT SHORT TERM GOAL #4  ? Title Pt / caregiver will verbalize understanding of adapted strategies to increase independence with ADLs/IADLs and to minimize pain.( sitting for  bathing, use of a schedule to assist with initation prn)   ? Time 4   ? Period Weeks   ? Status On-going   ?  ? OT SHORT TERM GOAL #5  ? Title Pt will increase bilateral grip strength by 5 lbs for increased ease with ADLs/ IADLs.   ? Baseline R 27.7, L 13.8   ? Time 4   ? Period Weeks   ? Status On-going   R 31.5, L 12.1  ?  ? OT SHORT TERM GOAL #6  ? Title Pt will decrease 9 hole peg test score by 3 secs bilaterally for increased ease with ADLs/IADLs.   ? Baseline R 44, L 39.12   ? Time 4   ? Period Weeks   ? Status On-going   RUE 38.63, LUE 41.90-met for RUE  ? ?  ?  ? ?  ? ? ? ? OT Long Term Goals - 10/18/21 1409   ? ?  ? OT LONG TERM GOAL #1  ? Title I with updated HEP.   ? Time 12   ? Period Weeks   ? Status On-going   ? Target Date 11/24/21   ?  ? OT LONG TERM GOAL #2  ? Title Pt will perform all basic ADLs at a modified independent level.   ? Time 12   ? Period Weeks   ? Status On-going   continues to require assit with UB dressing due to shoulder pain  ?  ? OT LONG TERM GOAL #3  ? Title Pt will demonstrate ability to retrieve a 3 lbs  object at 125* shoulder flexion with LUE  without dropping   ? Time 12   ? Period Weeks   ? Status On-going   Pt demonstrates grossly 85-90 shoulder flexion without resistance  ?  ? OT LONG TERM GOAL #4  ? Title Pt will perform simple cooking task with supervision demonstrating good safety awareness.   ? Time 12   ? Period Weeks   ? Status On-going   Pt reports cooking in crock pot after set up- pt-s son got crockpot out and pt seasoned, pt has made grits with supervision after set up  ?  ? OT LONG TERM GOAL #5  ? Title Pt will perform basic home mangement tasks at a mod I level .   ? Time 12   ? Period Weeks   ? Status On-going   Pt takes out the garbage cans  ? ?  ?  ? ?  ? ? ? ? ? ? ? ?  Plan - 10/18/21 1456   ? ? Clinical Impression Statement Pt demonstrates progres towards goals. He reports cooking a [place] in the crock pot at home following set up.   ? OT Occupational  Profile and History Detailed Assessment- Review of Records and additional review of physical, cognitive, psychosocial history related to current functional performance   ? Occupational performance deficits (Please refer to evaluation for details): ADL's;IADL's;Rest and Sleep;Work;Leisure;Social Participation   ? Body Structure / Function / Physical Skills ADL;Balance;Endurance;Mobility;Strength;Flexibility;UE functional use;FMC;Pain;Gait;Coordination;ROM;GMC;Decreased knowledge of precautions;Decreased knowledge of use of DME;Dexterity;IADL;Vision   ? Cognitive Skills Attention;Energy/Drive;Learn;Memory;Problem Solve;Safety Awareness;Temperament/Personality;Thought;Understand   ? Rehab Potential Good   ? Clinical Decision Making Limited treatment options, no task modification necessary   ? Comorbidities Affecting Occupational Performance: May have comorbidities impacting occupational performance   ? Modification or Assistance to Complete Evaluation  Min-Moderate modification of tasks or assist with assess necessary to complete eval   ? OT Frequency 2x / week   ? OT Duration 12 weeks   ? OT Treatment/Interventions Self-care/ADL training;Therapeutic exercise;Functional Mobility Training;Balance training;Splinting;Manual Therapy;Neuromuscular education;Ultrasound;Aquatic Therapy;Therapeutic activities;Cognitive remediation/compensation;DME and/or AE instruction;Paraffin;Cryotherapy;Electrical Stimulation;Visual/perceptual remediation/compensation;Fluidtherapy;Moist Heat;Passive range of motion;Patient/family education;Energy conservation   ? Plan reinforce home programs, anticipate placing OT on hold after next visit so pt can focus on shoulder PT and vestibular PT.   ? Recommended Other Services PT is addressing RUE shoulder s/p surgery, OT will address shoulder only in a functional context   ? Consulted and Agree with Plan of Care Patient   ? ?  ?  ? ?  ? ? ?Patient will benefit from skilled therapeutic intervention  in order to improve the following deficits and impairments:   ?Body Structure / Function / Physical Skills: ADL, Balance, Endurance, Mobility, Strength, Flexibility, UE functional use, FMC, Pain, Gait, C

## 2021-10-18 NOTE — Patient Instructions (Addendum)
SHOULDER: Flexion - Supine left arm only ? ? ? ?Raise one arm overhead with lead with thumb only to shoulder height __10_ reps per set, ___1 sets per day, ___ days per week ?Raise both arms at same speed. ? ?Copyright ? [employer]. All rights reserved.  ?SHOULDER: Flexion At Wall ? ? ? ?Slide left arm arms up wall  with towel under left hand, stop before pain. Maintain upright posture and tuck in stomach. Hold _5__ seconds. ?__10_ reps per set, _2 sets per day, _7__ days per week ? ?Copyright ? [employer]. All rights reserved.  ? ?
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
## 2021-10-21 ENCOUNTER — Ambulatory Visit: Payer: No Typology Code available for payment source | Admitting: Occupational Therapy

## 2021-10-21 ENCOUNTER — Ambulatory Visit: Payer: No Typology Code available for payment source

## 2021-10-21 DIAGNOSIS — M25611 Stiffness of right shoulder, not elsewhere classified: Secondary | ICD-10-CM

## 2021-10-21 DIAGNOSIS — R2681 Unsteadiness on feet: Secondary | ICD-10-CM

## 2021-10-21 DIAGNOSIS — M25612 Stiffness of left shoulder, not elsewhere classified: Secondary | ICD-10-CM

## 2021-10-21 DIAGNOSIS — R278 Other lack of coordination: Secondary | ICD-10-CM

## 2021-10-21 DIAGNOSIS — M6281 Muscle weakness (generalized): Secondary | ICD-10-CM

## 2021-10-21 DIAGNOSIS — R41844 Frontal lobe and executive function deficit: Secondary | ICD-10-CM

## 2021-10-21 NOTE — Therapy (Signed)
?[facility] [place] ?[place], [place], [address] ?Phone: [phone]   Fax:  [phone] ? ?Occupational Therapy Treatment ? ?Patient Details  ?Name: Craig Hart ?MRN: [id number] ?Date of Birth: 05-Oct-1955 ?Referring Provider (OT): Dr. Naaman Plummer ? ? ?Encounter Date: 10/21/2021 ? ? OT End of Session - 10/21/21 1235   ? ? Visit Number 12   ? Number of Visits 25   ? Date for OT Re-Evaluation 11/24/21   ? Authorization Type Worker's comp, 12 visits approved, state [employer]   ? Authorization - Visit Number 12   ? Authorization - Number of Visits 12   ? OT Start Time 1233   ? OT Stop Time 1313   ? OT Time Calculation (min) 40 min   ? Activity Tolerance Patient tolerated treatment well   ? Behavior During Therapy [facility] for tasks assessed/performed   ? ?  ?  ? ?  ? ? ?Past Medical History:  ?Diagnosis Date  ? Hypertension   ? Hypertensive emergency 09/21/2014  ? ? ?Past Surgical History:  ?Procedure Laterality Date  ? LEFT HEART CATHETERIZATION WITH CORONARY ANGIOGRAM N/A 09/21/2014  ? Procedure: LEFT HEART CATHETERIZATION WITH CORONARY ANGIOGRAM;  Surgeon: Sherren Mocha, MD;  Location: [facility] CATH LAB;  Service: Cardiovascular:  minimal LAD disease.  20-30% mid RCA disease with a right dominant system.  EF 65-70%. --False activation of ST elevation MI.  ? SHOULDER SURGERY Right 06/29/2021  ? TONSILLECTOMY    ? ? ?There were no vitals filed for this visit. ? ? Subjective Assessment - 10/21/21 1235   ? ? Subjective  Pt reports shoulder pain, rough day   ? 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 MRI report of brain was relatively unremarkable for head injury Cervical myelopathy,  09/21/20 to C3-4 discectomy with fusion , R shoulder surgery 06/29/21  - for torn tendon and removal of bone spurs on 06/29/21- receiving PT for R shoulder   ?  Limitations Goes by "[name]"   ? Patient Stated Goals increase independence with ADLs/IADLS   ? Currently in Pain? Yes   ? Pain Score 7    ? Pain Location Shoulder   ? Pain Orientation Right   ? Pain Descriptors / Indicators Aching   ? Pain Type Chronic pain   ? Pain Onset More than a month ago   ? Pain Frequency Constant   ? Aggravating Factors  use   ? Pain Relieving Factors rest, ice   ? ?  ?  ? ?  ? ? ? ? ? ? ? ? ? ?Treatment: Therapist checked progress towards long and short term goals  and discussed with pt. And wife. ?Wall and tableslides for gentle LUE A/ROM ?Therapist verbally added to pt's putty HEP(pt did not have putty with him), exercises for tip pinch, and discussed with pt./wife ?Therapist discussed ways pt can progress at home including performing more UB dress and bathing himself. ?Pt has been encouraged to perform simple cooking tasks such as making grits from start to finish mod I. ?Pt and wife verbalize understanding of rationale for place OT on hold at this time and activities to perform at home. ? ? ? ? ? ? ? ? ? ? ? ? ? ? OT Short Term Goals - 10/21/21 1244   ? ?  ? OT SHORT TERM GOAL #1  ? Title I with initial HEP   ?  Time 4   ? Period Weeks   ? Status Achieved   ? Target Date 09/29/21   ?  ? OT SHORT TERM GOAL #2  ? Title Pt will donn shirt consistently with min A.   ? Time 4   ? Period Weeks   ? Status Partially Met   Pt is not performing due to shoulder pain, 40% with pullover, pt donns button front Ily  ?  ? OT SHORT TERM GOAL #3  ? Title Pt will complete bathing with min A.   ? Time 4   ? Period Weeks   ? Status Achieved   ?  ? OT SHORT TERM GOAL #4  ? Title Pt / caregiver will verbalize understanding of adapted strategies to increase independence with ADLs/IADLs and to minimize pain.( sitting for bathing, use of a schedule to assist with initation prn)   ? Time 4   ? Period Weeks   ? Status Achieved   ?  ? OT SHORT TERM GOAL #5  ? Title Pt will increase bilateral grip strength by 5 lbs  for increased ease with ADLs/ IADLs.   ? Baseline R 27.7, L 13.8   ? Time 4   ? Period Weeks   ? Status Achieved   R 43.8, L 25.1  ?  ? OT SHORT TERM GOAL #6  ? Title Pt will decrease 9 hole peg test score by 3 secs bilaterally for increased ease with ADLs/IADLs.   ? Baseline R 44, L 39.12   ? Time 4   ? Period Weeks   ? Status Achieved   RUE- 31.50 , LUE 32.12  ? ?  ?  ? ?  ? ? ? ? OT Long Term Goals - 10/21/21 1256   ? ?  ? OT LONG TERM GOAL #1  ? Title I with updated HEP.   ? Time 12   ? Period Weeks   ? Status Achieved   ? Target Date 11/24/21   ?  ? OT LONG TERM GOAL #2  ? Title Pt will perform all basic ADLs at a modified independent level.   ? Time 12   ? Period Weeks   ? Status Not Met   continues to require assist with UB dressing due to shoulder pain, aslos min A with bathing  ?  ? OT LONG TERM GOAL #3  ? Title Pt will demonstrate ability to retrieve a 3 lbs  object at 125* shoulder flexion with LUE  without dropping   ? Time 12   ? Period Weeks   ? Status Not Met   Pt demonstrates grossly 90* shoulder flexion without resistance, prior to pain sharp pain after 90*  ?  ? OT LONG TERM GOAL #4  ? Title Pt will perform simple cooking task with supervision demonstrating good safety awareness.   ? Time 12   ? Period Weeks   ? Status Not Met   Pt reports cooking in crock pot after min-supervision pt-s son got crockpot out and pt seasoned, pt has made grits with supervision after set up  ?  ? OT LONG TERM GOAL #5  ? Title Pt will perform basic home mangement tasks at a mod I level .   ? Time 12   ? Period Weeks   ? Status Partially Met   Pt takes out the garbage cans, takes plate to sink after meal, Not back to prior level  ? ?  ?  ? ?  ? ? ? ? ? ? ? ?  Plan - 10/21/21 1338   ? ? Clinical Impression Statement Pt has made progress towards some of his goals. He did not fully achieve all goals as he he is limited by bilateral shoulder pain, as well as dizziness. Occupational therapy is being placed on hold at this  time so that pt. can focus on PT for right shoulder and vestibular issues. Patient may benefit from continued OT in 4-6 weeks once some of his other deficits have been addressed. Pt reports he is having an MRI of his cervical spine at ortho.   ? OT Occupational Profile and History Detailed Assessment- Review of Records and additional review of physical, cognitive, psychosocial history related to current functional performance   ? Occupational performance deficits (Please refer to evaluation for details): ADL's;IADL's;Rest and Sleep;Work;Leisure;Social Participation   ? Body Structure / Function / Physical Skills ADL;Balance;Endurance;Mobility;Strength;Flexibility;UE functional use;FMC;Pain;Gait;Coordination;ROM;GMC;Decreased knowledge of precautions;Decreased knowledge of use of DME;Dexterity;IADL;Vision   ? Cognitive Skills Attention;Energy/Drive;Learn;Memory;Problem Solve;Safety Awareness;Temperament/Personality;Thought;Understand   ? Rehab Potential Good   ? Clinical Decision Making Limited treatment options, no task modification necessary   ? Comorbidities Affecting Occupational Performance: May have comorbidities impacting occupational performance   ? Modification or Assistance to Complete Evaluation  Min-Moderate modification of tasks or assist with assess necessary to complete eval   ? OT Frequency 2x / week   ? OT Duration 12 weeks   ? OT Treatment/Interventions Self-care/ADL training;Therapeutic exercise;Functional Mobility Training;Balance training;Splinting;Manual Therapy;Neuromuscular education;Ultrasound;Aquatic Therapy;Therapeutic activities;Cognitive remediation/compensation;DME and/or AE instruction;Paraffin;Cryotherapy;Electrical Stimulation;Visual/perceptual remediation/compensation;Fluidtherapy;Moist Heat;Passive range of motion;Patient/family education;Energy conservation   ? Plan OT is placed on hold at this time. Pt may benefit from additional OT in 4-6 weeks once R shoulder and vestibular  issues are addressed by PT, will need additional worker's comp approval.   ? Recommended Other Services PT is addressing RUE shoulder s/p surgery, OT will address shoulder only in a functional context   ? Consulted and

## 2021-10-26 ENCOUNTER — Encounter: Payer: No Typology Code available for payment source | Attending: Psychology | Admitting: Psychology

## 2021-10-26 DIAGNOSIS — F0781 Postconcussional syndrome: Secondary | ICD-10-CM | POA: Diagnosis present

## 2021-10-26 DIAGNOSIS — G479 Sleep disorder, unspecified: Secondary | ICD-10-CM

## 2021-10-26 DIAGNOSIS — F329 Major depressive disorder, single episode, unspecified: Secondary | ICD-10-CM | POA: Insufficient documentation

## 2021-11-09 ENCOUNTER — Ambulatory Visit: Payer: BC Managed Care – PPO | Attending: Physical Medicine & Rehabilitation

## 2021-11-09 DIAGNOSIS — R278 Other lack of coordination: Secondary | ICD-10-CM | POA: Insufficient documentation

## 2021-11-09 DIAGNOSIS — R42 Dizziness and giddiness: Secondary | ICD-10-CM | POA: Diagnosis present

## 2021-11-09 DIAGNOSIS — M6281 Muscle weakness (generalized): Secondary | ICD-10-CM | POA: Diagnosis present

## 2021-11-09 DIAGNOSIS — R2681 Unsteadiness on feet: Secondary | ICD-10-CM | POA: Diagnosis present
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
## 2021-11-10 ENCOUNTER — Encounter: Payer: No Typology Code available for payment source | Attending: Psychology | Admitting: Psychology

## 2021-11-10 DIAGNOSIS — F0781 Postconcussional syndrome: Secondary | ICD-10-CM | POA: Insufficient documentation

## 2021-11-10 DIAGNOSIS — G479 Sleep disorder, unspecified: Secondary | ICD-10-CM

## 2021-11-10 DIAGNOSIS — F329 Major depressive disorder, single episode, unspecified: Secondary | ICD-10-CM | POA: Insufficient documentation

## 2021-11-16 ENCOUNTER — Encounter (HOSPITAL_BASED_OUTPATIENT_CLINIC_OR_DEPARTMENT_OTHER): Payer: No Typology Code available for payment source | Admitting: Physical Medicine & Rehabilitation

## 2021-11-16 ENCOUNTER — Encounter: Payer: Self-pay | Admitting: Physical Medicine & Rehabilitation

## 2021-11-16 VITALS — BP 141/80 | HR 83 | Ht 71.0 in | Wt 230.6 lb

## 2021-11-16 DIAGNOSIS — F0781 Postconcussional syndrome: Secondary | ICD-10-CM | POA: Diagnosis not present

## 2021-11-16 DIAGNOSIS — G479 Sleep disorder, unspecified: Secondary | ICD-10-CM | POA: Diagnosis not present

## 2021-11-16 DIAGNOSIS — F329 Major depressive disorder, single episode, unspecified: Secondary | ICD-10-CM | POA: Diagnosis not present

## 2021-11-16 NOTE — Progress Notes (Signed)
Subjective:    Patient ID: Craig Hart, male    DOB: Jun 22, 1955, 66 y.o.   MRN: [id number]  HPI   Craig Hart is here in follow-up of his postconcussion syndrome and related problems.  He has had good results with the Seroquel as his sleep is much improved.  He noticed that when he missed the medication 1 night that his sleep was significantly worse.  Since then he has been sure to make sure he is taking it on schedule.  Lexapro has improved his mood as well.  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  Outpatient physical therapy at [facility] neuro rehab his working on these deficits and making slow progress.  He does now have an appointment set up at [facility] vision to perform a neuro eye assessment.  OT has been working on his shoulders.  Shoulders have been much better since receiving injections by orthopedic surgery  He has been doing a bit more with his wife outside the house but still is reluctant to go out in the community very much.  He will go to church to listen to the preacher sermon but not much besides that.  Still needs Zofran for nausea when he is overstimulated.   Pain Inventory Average Pain 7 Pain Right Now 6 My pain is sharp, dull, tingling, and numbness  In the last 24 hours, has pain interfered with the following? General activity 7 Relation with others 8 Enjoyment of life 10 What TIME of day is your pain at its worst? varies Sleep (in general) Poor  Pain is worse with: inactivity and reaching or grabbing Pain improves with: therapy/exercise, pacing activities, medication, and injections Relief from Meds: 3  Family History  Problem Relation Age of Onset   Hypertension Mother    Diabetes Mother    Hypertension Father    Social History   Socioeconomic History   Marital status: Married    Spouse name: Not on file   Number of children: Not on file   Years of education: Not on file   Highest education level: Not on  file  Occupational History   Not on file  Tobacco Use   Smoking status: Never    Passive exposure: Never   Smokeless tobacco: Never  Vaping Use   Vaping Use: Never used  Substance and Sexual Activity   Alcohol use: No   Drug use: No   Sexual activity: Yes  Other Topics Concern   Not on file  Social History Narrative   Not on file   Social Determinants of Health   Financial Resource Strain: Not on file  Food Insecurity: Not on file  Transportation Needs: Not on file  Physical Activity: Not on file  Stress: Not on file  Social Connections: Not on file   Past Surgical History:  Procedure Laterality Date   LEFT HEART CATHETERIZATION WITH CORONARY ANGIOGRAM N/A 09/21/2014   Procedure: LEFT HEART CATHETERIZATION WITH CORONARY ANGIOGRAM;  Surgeon: Tonny Bollman, MD;  Location: [facility] CATH LAB;  Service: Cardiovascular:  minimal LAD disease.  20-30% mid RCA disease with a right dominant system.  EF 65-70%. --False activation of ST elevation MI.   SHOULDER SURGERY Right 06/29/2021   TONSILLECTOMY     Past Surgical History:  Procedure Laterality Date   LEFT HEART CATHETERIZATION WITH CORONARY ANGIOGRAM N/A 09/21/2014   Procedure: LEFT HEART CATHETERIZATION WITH CORONARY ANGIOGRAM;  Surgeon: Tonny Bollman, MD;  Location: [facility] CATH LAB;  Service: Cardiovascular:  minimal  LAD disease.  20-30% mid RCA disease with a right dominant system.  EF 65-70%. --False activation of ST elevation MI.   SHOULDER SURGERY Right 06/29/2021   TONSILLECTOMY     Past Medical History:  Diagnosis Date   Hypertension    Hypertensive emergency 09/21/2014   BP (!) 141/80   Pulse 83   Ht 5\' 11"  (1.803 m)   Wt 230 lb 9.6 oz (104.6 kg)   SpO2 96%   BMI 32.16 kg/m   Opioid Risk Score:   Fall Risk Score:  `1  Depression screen PHQ 2/9     08/17/2021    1:39 PM 05/18/2021    1:10 PM 03/10/2021    9:15 AM 02/03/2021    9:34 AM 08/05/2020   10:55 AM 06/28/2020    3:09 PM 05/06/2020    3:24 PM  Depression  screen PHQ 2/9  Decreased Interest 3 1 0 0 0 1 0  Down, Depressed, Hopeless 2 1 0 0 0 1 0  PHQ - 2 Score 5 2 0 0 0 2 0     Review of Systems  Musculoskeletal:        Bilateral shoulder pain Left arm pain  All other systems reviewed and are negative.     Objective:   Physical Exam  General: No acute distress HEENT: NCAT, EOMI, oral membranes moist Cards: reg rate  Chest: normal effort Abdomen: Soft, NT, ND Skin: dry, intact Extremities: no edema Psych: pleasant and appropriate, in much better spirits  Neuro: Follows basic commands. Much more on point. More alert and attentive.  visual confrontation better but still triggers uncomfortable sensation.   Balance was good when he takes extra time. .  Romberg test still+ positive.  strength is 4+ to 5 out of 5 in lower extremities.  Right upper extremity 4+ to 5 out of 5.  Left upper extremity 4 - to 4 out of 5 proximal distal.  Since pain and light touch in all 4. Musculoskeletal: Pain along posterior neck and upper shoulders.         Assessment & Plan:    1. Post concussive syndrome - predominantly with nausea >>> headaches, balance, mood disturbance (loss of interest/frustration per wife), also with cognitive slowing and irritability. MRI report of brain was relatively unremarkable for head injury            -continue outpt OT for shoulder           -hold on outpt vestibular therapies as we're starting neuro-eye treatments            -he has an appt 11/22/21 with Neuro-optometrist ([facility]) for vision therapy/vision assessment-              -prn zofran and meclizine                          2. Sleep disturbance: An ongoing problem             -seroquel 25mg  at 9pm nightly             -MUCH IMPROVED             - consistent bedtime and improved sleep hygiene  has made a difference                        3. Myalgia  ROM/ PT ONGOING   4. Cervical myelopathy             09/21/20 to C3-4  discectomy with fusion                5. Irritability/depression              -continue lexapro             -Seroquel  6. Left shoulder: per Dr. Gordan PaymentGraves--pain improved  -shoulder injections per his office  -needs to continue therapies 7. Left ear pain/?tinnitus b     30 minutes of face to face patient care time were spent during this visit. All questions were encouraged and answered.  F/u in 3 mos. Discussed with WC case manager

## 2021-11-16 NOTE — Patient Instructions (Signed)
CONTINUE TO WORK ON ACCLIMATION, GETTING OUT IN COMMUNITY, EXERCISING ETC

## 2021-11-17 ENCOUNTER — Ambulatory Visit: Payer: BC Managed Care – PPO | Attending: Physical Medicine & Rehabilitation

## 2021-11-17 VITALS — BP 120/82 | HR 70

## 2021-11-17 DIAGNOSIS — R2681 Unsteadiness on feet: Secondary | ICD-10-CM | POA: Insufficient documentation

## 2021-11-17 DIAGNOSIS — R42 Dizziness and giddiness: Secondary | ICD-10-CM | POA: Insufficient documentation

## 2021-11-17 DIAGNOSIS — M6281 Muscle weakness (generalized): Secondary | ICD-10-CM | POA: Insufficient documentation

## 2021-11-17 DIAGNOSIS — R278 Other lack of coordination: Secondary | ICD-10-CM | POA: Insufficient documentation
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
## 2021-11-18 ENCOUNTER — Ambulatory Visit: Payer: BC Managed Care – PPO

## 2021-11-18 VITALS — BP 136/88

## 2021-11-18 DIAGNOSIS — R42 Dizziness and giddiness: Secondary | ICD-10-CM

## 2021-11-18 DIAGNOSIS — M6281 Muscle weakness (generalized): Secondary | ICD-10-CM | POA: Diagnosis not present

## 2021-11-18 DIAGNOSIS — R2681 Unsteadiness on feet: Secondary | ICD-10-CM
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
## 2021-11-22 ENCOUNTER — Ambulatory Visit: Payer: BC Managed Care – PPO

## 2021-11-22 DIAGNOSIS — R42 Dizziness and giddiness: Secondary | ICD-10-CM

## 2021-11-22 DIAGNOSIS — M6281 Muscle weakness (generalized): Secondary | ICD-10-CM | POA: Diagnosis not present

## 2021-11-22 DIAGNOSIS — R2681 Unsteadiness on feet: Secondary | ICD-10-CM
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
## 2021-11-23 ENCOUNTER — Encounter: Payer: No Typology Code available for payment source | Attending: Psychology | Admitting: Psychology

## 2021-11-23 DIAGNOSIS — G479 Sleep disorder, unspecified: Secondary | ICD-10-CM | POA: Insufficient documentation

## 2021-11-23 DIAGNOSIS — F0781 Postconcussional syndrome: Secondary | ICD-10-CM | POA: Diagnosis present

## 2021-11-23 DIAGNOSIS — F329 Major depressive disorder, single episode, unspecified: Secondary | ICD-10-CM | POA: Diagnosis present

## 2021-11-24 ENCOUNTER — Ambulatory Visit: Payer: BC Managed Care – PPO
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
## 2021-11-28 ENCOUNTER — Ambulatory Visit: Payer: BC Managed Care – PPO

## 2021-11-28 VITALS — BP 146/90 | HR 67

## 2021-11-28 DIAGNOSIS — M6281 Muscle weakness (generalized): Secondary | ICD-10-CM | POA: Diagnosis not present

## 2021-11-28 DIAGNOSIS — R42 Dizziness and giddiness: Secondary | ICD-10-CM

## 2021-11-28 DIAGNOSIS — R278 Other lack of coordination: Secondary | ICD-10-CM

## 2021-11-28 DIAGNOSIS — R2681 Unsteadiness on feet: Secondary | ICD-10-CM
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
## 2021-11-30 ENCOUNTER — Encounter: Payer: Self-pay | Admitting: Psychology

## 2021-11-30 ENCOUNTER — Ambulatory Visit: Payer: BC Managed Care – PPO

## 2021-11-30 VITALS — BP 136/71 | HR 73

## 2021-11-30 DIAGNOSIS — R42 Dizziness and giddiness: Secondary | ICD-10-CM

## 2021-11-30 DIAGNOSIS — R2681 Unsteadiness on feet: Secondary | ICD-10-CM

## 2021-11-30 DIAGNOSIS — M6281 Muscle weakness (generalized): Secondary | ICD-10-CM | POA: Diagnosis not present
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
## 2021-11-30 NOTE — Progress Notes (Signed)
Neuropsychology Visit  Patient:  Craig Hart   DOB: [birth year]  MR Number: [id number]  Location: [facility] FOR PAIN AND [facility] MEDICINE [facility] PHYSICAL MEDICINE AND REHABILITATION [address] [place], [address] [id number] [facility] [place] [address] Dept: [phone]  Date of Service: 11/23/2021  Start: 1 PM End: 2 PM  Today's visit was an in person visit as conducted in my outpatient clinic office.  The patient, his wife myself were present.  Duration of Service: 1 Hour  Provider/Observer:     Hershal Coria PsyD  Chief Complaint:      Chief Complaint  Patient presents with   Sleeping Problem   Depression   Memory Loss   Other    Attention and concentration difficulties due to postconcussion syndrome    Reason For Service:     Craig Hart is a 66 year old male referred for neuropsychological consultation and therapeutic interventions by Maryla Morrow, MD for neuropsychological consultation due to ongoing and residual effects of a concussive event with ongoing issues of anxiety, depression, sleep disturbance, lack of motivation, flatness of affect, attention concentration deficits and depression.  Patient is struggled with daily nausea as well as difficulty with focus and balance.  Significant sleep disturbance continues with multiple efforts to gain better circadian rhythm patterns.  The patient is now being followed by Faith Rogue, MD for ongoing physiatry care and the patient has therapies through outpatient rehab.  The patient had a work-related injury in April 2021.  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  There was an equipment failure and the metal apparatus holding the fuel nozzle broke from above striking him in the head.  At the time he denied a full loss of consciousness and was struck in the left ear with immediate pain.  There is also no retrograde or  anterograde amnesia reported.  During the clinical interview greater detail was provided by the patient and his wife regarding the accident with his wife helping to fill-in as the patient has some difficulty with recall of all the details.  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  A bar holding the fuel line broke and a pipe swung around hitting the patient his head.  The patient reports that he did not initially realize what it happened for the first couple of seconds but reports that he was stunned from this.  The patient reports that he had to struggle to some degree getting down off of the truck.  He told his supervisor and filled out an accident report.  The patient reports that he had to sit down initially during this time to get his thoughts together but later drove himself to urgent care.  Patient's wife reports that she was told that he was bleeding from his left ear and left arm.  Patient was told after leaving the initial urgent care encounter that if he had further problems to come back for review.  The patient returned on the following Monday with significant headache sore ear but still denying any cognitive symptoms or visual disturbance.  He did describe jerking his head trying to get away from it but got it anyway.  The patient described having significant difficulties with his neck and that it was very painful.  He reported that he could hardly move his head.  He also reported  difficulty getting out of bed and not being able to sleep.  He felt that his level of pain was causing him to have nausea.  He denied any prior neck problems or neck difficulties.  Patient return for the third urgent care visit 4 days later (10/17/2019) stating that he was no better and he still had significant neck pain and still having significant headaches and nausea.  Patient was describing having significant difficulty getting enough sleep after the injury  and that he was having daily headaches and daily nausea.  He denied any significant problems with cognition or visual symptoms.  He also denied problems with strength or balance.  At the time he also is reported to have denied previous issues with headaches but earlier medical records do suggest at least a previous episode of significant headache as a CT scan of his head without contrast was performed back in November 2014 with clinical data described as for headache and hypertension.  The CT in 2014 found no acute intracranial abnormality and no indication of acute process.  The patient has continued to have significant difficulties with headache, sleep disturbance, nausea, tingling in his left arm with numbness, and has had cervical surgery through neurosurgery as well as orthopedic surgery on his left shoulder through Dr. Luiz Blare orthopedics.  The patient is also participated in physical therapy previously where they tried traction initially but ultimately required surgery with 2 level neck fusion.  The patient is still having issues consistent with concussive type symptoms including ongoing nausea, headaches and balance issues.  He saw Dr. Allena Katz for some time who tried a number of different medications.  The patient has done both physical therapy has been well as vestibular therapies and also participated in speech and occupational therapies.  Patient and his wife reports that there has been a significant impact on the quality of his life and his difficulties of Him from working.  The patient describes significant sleep disturbance with very restless sleep.  The patient reports that he will take his medications and is able to go to sleep but then will sleep 2 to 3 hours straight and then wake up in the night and have difficulty going back to sleep.  The patient has regular nights with very limited and poor sleep quality.  The patient describes memory difficulties and changes in short-term memory as well as  attention and concentration weaknesses.  With standard questions about his sleep patterns I took the occasion to ask the patient and his wife about snoring and possible symptoms of obstructive sleep apnea.  The patient's wife reports that she did start noticing significant snoring and pauses during breathing while he was sleeping after his injury.  She reports that she was paying more close attention to him after his neck injury and during the times he was having neck difficulties and the patient was having very loud snoring with symptoms such as breaks in snoring patterns consistent with apneic events.  Patient has had an MRI brain conducted on 12/08/2019 that showed no acute process and essentially was a normal MRI with only mild small vessel ischemic changes consistent with age.  Treatment Interventions:  Cognitive/behavioral therapeutic interventions and working on coping and adjustment skills following residual effects of postconcussion syndrome.  Participation Level:   Active  Participation Quality:  Appropriate      Behavioral Observation:  Well Groomed, Alert, and Appropriate.   Current Psychosocial Factors: The patient appears much improved today over previous visits.  He was much  more engaged and talkative and this is reported to also reflect significant improvements in day-to-day activity described by his wife.  The patient's affect was brighter and he was much more engaging.  Content of Session:   Reviewed current symptoms and continue to work on therapeutic interventions around residual effects of his postconcussion syndrome.  The patient has continued to struggle with engagement, attention and concentration difficulties and motivation.  Effectiveness of Interventions: The patient appears to be significantly improving.  He was more engaging during the visit today with brighter affect and confirmed similar changes in day-to-day activity by his wife.  He continues to have difficulty with  sleep but has been actively working on sleep hygiene issues resulting in improvements in this aspect..  Target Goals:   The patient continues to struggle with attention and concentration difficulties and motivation with reactive depression symptoms.  Our target goals include increasing engagement in activities and working on increasing challenges for the patient as far as cognitive interactions.  Goals Last Reviewed:   11/23/2021  Goals Addressed Today:    We continue to work on therapeutic interventions around better management and coping with postconcussion syndrome symptoms, social engagement and activity levels at home.  Impression/Diagnosis:   Mylo RedWillie N Baird is a 66 year old male referred for neuropsychological consultation and therapeutic interventions by Maryla MorrowAnkit Patel, MD for neuropsychological consultation due to ongoing and residual effects of a concussive event with ongoing issues of anxiety, depression, sleep disturbance, lack of motivation, flatness of affect, attention concentration deficits and depression.  Patient is struggled with daily nausea as well as difficulty with focus and balance.  Significant sleep disturbance continues with multiple efforts to gain better circadian rhythm patterns.  The patient is now being followed by Faith RogueZachary Swartz, MD for ongoing physiatry care and the patient has therapies through outpatient rehab.  Diagnosis:   Post concussive syndrome  Sleep disturbance  Reactive depression    Arley PhenixJohn Tajae Rybicki, Psy.D. Clinical Psychologist Neuropsychologist

## 2021-12-07 ENCOUNTER — Encounter (HOSPITAL_BASED_OUTPATIENT_CLINIC_OR_DEPARTMENT_OTHER): Payer: No Typology Code available for payment source | Admitting: Psychology

## 2021-12-07 DIAGNOSIS — G479 Sleep disorder, unspecified: Secondary | ICD-10-CM

## 2021-12-07 DIAGNOSIS — F329 Major depressive disorder, single episode, unspecified: Secondary | ICD-10-CM

## 2021-12-07 DIAGNOSIS — F0781 Postconcussional syndrome: Secondary | ICD-10-CM

## 2021-12-09 ENCOUNTER — Ambulatory Visit: Payer: BC Managed Care – PPO

## 2021-12-15 ENCOUNTER — Encounter: Payer: Self-pay | Admitting: Psychology

## 2021-12-15 NOTE — Progress Notes (Signed)
Neuropsychology Visit  Patient:  Craig Hart   DOB: 1956/01/03  MR Number: [id number]  Location: [facility] FOR PAIN AND [facility] MEDICINE [facility] PHYSICAL MEDICINE AND REHABILITATION [address] [place], [id number] [facility] [place] [address] Dept: [phone]  Date of Service: 12/07/2021  Start: 2 PM End: 3 PM  Today's visit was an in person visit as conducted in my outpatient clinic office.  The patient, his wife myself were present.  In the last 15 minutes of the appointment the patient's case manager also was brought into the visit and we reviewed some of the goals and plans going forward.  Duration of Service: 1 Hour  Provider/Observer:     Hershal Coria PsyD  Chief Complaint:      Chief Complaint  Patient presents with   Sleeping Problem   Memory Loss   Depression   Other    Reason For Service:     Craig Hart is a 66 year old male referred for neuropsychological consultation and therapeutic interventions by Maryla Morrow, MD for neuropsychological consultation due to ongoing and residual effects of a concussive event with ongoing issues of anxiety, depression, sleep disturbance, lack of motivation, flatness of affect, attention concentration deficits and depression.  Patient is struggled with daily nausea as well as difficulty with focus and balance.  Significant sleep disturbance continues with multiple efforts to gain better circadian rhythm patterns.  The patient is now being followed by Faith Rogue, MD for ongoing physiatry care and the patient has therapies through outpatient rehab.  The patient had a work-related injury in April 2021.  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  There was an equipment failure and the metal apparatus holding the fuel nozzle broke from above striking him in the head.  At the time he denied a full loss of consciousness  and was struck in the left ear with immediate pain.  There is also no retrograde or anterograde amnesia reported.  During the clinical interview greater detail was provided by the patient and his wife regarding the accident with his wife helping to fill-in as the patient has some difficulty with recall of all the details.  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  A bar holding the fuel line broke and a pipe swung around hitting the patient his head.  The patient reports that he did not initially realize what it happened for the first couple of seconds but reports that he was stunned from this.  The patient reports that he had to struggle to some degree getting down off of the truck.  He told his supervisor and filled out an accident report.  The patient reports that he had to sit down initially during this time to get his thoughts together but later drove himself to urgent care.  Patient's wife reports that she was told that he was bleeding from his left ear and left arm.  Patient was told after leaving the initial urgent care encounter that if he had further problems to come back for review.  The patient returned on the following Monday with significant headache sore ear but still denying any cognitive symptoms or visual disturbance.  He did describe jerking his head trying to get away from it but got it anyway.  The patient described having significant difficulties with his neck and  that it was very painful.  He reported that he could hardly move his head.  He also reported difficulty getting out of bed and not being able to sleep.  He felt that his level of pain was causing him to have nausea.  He denied any prior neck problems or neck difficulties.  Patient return for the third urgent care visit 4 days later (10/17/2019) stating that he was no better and he still had significant neck pain and still having significant headaches and nausea.   Patient was describing having significant difficulty getting enough sleep after the injury and that he was having daily headaches and daily nausea.  He denied any significant problems with cognition or visual symptoms.  He also denied problems with strength or balance.  At the time he also is reported to have denied previous issues with headaches but earlier medical records do suggest at least a previous episode of significant headache as a CT scan of his head without contrast was performed back in November 2014 with clinical data described as for headache and hypertension.  The CT in 2014 found no acute intracranial abnormality and no indication of acute process.  The patient has continued to have significant difficulties with headache, sleep disturbance, nausea, tingling in his left arm with numbness, and has had cervical surgery through neurosurgery as well as orthopedic surgery on his left shoulder through Dr. Luiz Blare orthopedics.  The patient is also participated in physical therapy previously where they tried traction initially but ultimately required surgery with 2 level neck fusion.  The patient is still having issues consistent with concussive type symptoms including ongoing nausea, headaches and balance issues.  He saw Dr. Allena Katz for some time who tried a number of different medications.  The patient has done both physical therapy has been well as vestibular therapies and also participated in speech and occupational therapies.  Patient and his wife reports that there has been a significant impact on the quality of his life and his difficulties of Him from working.  The patient describes significant sleep disturbance with very restless sleep.  The patient reports that he will take his medications and is able to go to sleep but then will sleep 2 to 3 hours straight and then wake up in the night and have difficulty going back to sleep.  The patient has regular nights with very limited and poor sleep  quality.  The patient describes memory difficulties and changes in short-term memory as well as attention and concentration weaknesses.  With standard questions about his sleep patterns I took the occasion to ask the patient and his wife about snoring and possible symptoms of obstructive sleep apnea.  The patient's wife reports that she did start noticing significant snoring and pauses during breathing while he was sleeping after his injury.  She reports that she was paying more close attention to him after his neck injury and during the times he was having neck difficulties and the patient was having very loud snoring with symptoms such as breaks in snoring patterns consistent with apneic events.  Patient has had an MRI brain conducted on 12/08/2019 that showed no acute process and essentially was a normal MRI with only mild small vessel ischemic changes consistent with age.  Treatment Interventions:  Cognitive/behavioral therapeutic interventions and working on coping and adjustment skills following residual effects of postconcussion syndrome.  Participation Level:   Active  Participation Quality:  Appropriate      Behavioral Observation:  Well Groomed, Alert, and  Appropriate.   Current Psychosocial Factors: The patient reports as well as being consistent firmed by his wife that he has been doing more and there has been some improvements in his activity level.  He still struggles with residual symptoms consistent with postconcussion syndrome but his depressive symptoms have been improving.  The patient is actively working on some foundational issues including diet, exercise and working on improved sleep hygiene factors with beneficial to overall sleep patterns.  Content of Session:   Reviewed current symptoms and continue to work on therapeutic interventions around residual effects of his postconcussion syndrome.  The patient has continued to struggle with engagement, attention and concentration  difficulties and motivation.  Effectiveness of Interventions: The patient appears to be significantly improving.  He was more engaging during the visit today with brighter affect and confirmed similar changes in day-to-day activity by his wife.  He continues to have difficulty with sleep but has been actively working on sleep hygiene issues resulting in improvements in this aspect..  Target Goals:   The patient continues to struggle with attention and concentration difficulties and motivation with reactive depression symptoms.  Our target goals include increasing engagement in activities and working on increasing challenges for the patient as far as cognitive interactions.  Goals Last Reviewed:   12/07/2021  Goals Addressed Today:    We continue to work on therapeutic interventions around better management and coping with postconcussion syndrome symptoms, social engagement and activity levels at home.  Impression/Diagnosis:   Craig Hart is a 66 year old male referred for neuropsychological consultation and therapeutic interventions by Maryla Morrow, MD for neuropsychological consultation due to ongoing and residual effects of a concussive event with ongoing issues of anxiety, depression, sleep disturbance, lack of motivation, flatness of affect, attention concentration deficits and depression.  Patient is struggled with daily nausea as well as difficulty with focus and balance.  Significant sleep disturbance continues with multiple efforts to gain better circadian rhythm patterns.  The patient is now being followed by Faith Rogue, MD for ongoing physiatry care and the patient has therapies through outpatient rehab.  At this point, I do think that the patient has made improvements and we will look at other possible interventions and discussed possible benefit down the road for ketamine infusion type treatments although at this point he is showing progress and I would like to see how his improvements  continue going forward.  Diagnosis:   Post concussive syndrome  Sleep disturbance  Reactive depression    Arley Phenix, Psy.D. Clinical Psychologist Neuropsychologist

## 2021-12-16 ENCOUNTER — Ambulatory Visit: Payer: BC Managed Care – PPO

## 2021-12-16 VITALS — BP 142/82 | HR 67

## 2021-12-16 DIAGNOSIS — R42 Dizziness and giddiness: Secondary | ICD-10-CM

## 2021-12-16 DIAGNOSIS — R278 Other lack of coordination: Secondary | ICD-10-CM

## 2021-12-16 DIAGNOSIS — M6281 Muscle weakness (generalized): Secondary | ICD-10-CM

## 2021-12-16 DIAGNOSIS — R2681 Unsteadiness on feet: Secondary | ICD-10-CM
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
## 2021-12-19 ENCOUNTER — Telehealth: Payer: Self-pay

## 2021-12-19 NOTE — Telephone Encounter (Signed)
Prior Authorization request for Escitalopram & Quetiapine fax to [employer] RN (worker's comp). Successfully sent.

## 2022-01-13 ENCOUNTER — Telehealth: Payer: Self-pay

## 2022-01-13 NOTE — Telephone Encounter (Signed)
PA request for Quetiapine Fumarate 25 MG fax to [employer] Nurse, Asencion Partridge fax [phone], phone [phone]. Confirmation  received.

## 2022-01-16 ENCOUNTER — Telehealth: Payer: Self-pay

## 2022-01-16 NOTE — Telephone Encounter (Signed)
PA request for Escitalopram Oxalate 5 mg fax to [employer] Nurse, Asencion Partridge fax [phone], phone [phone]. Confirmation  received.

## 2022-02-22 ENCOUNTER — Encounter: Payer: Self-pay | Admitting: Physical Medicine & Rehabilitation

## 2022-02-22 ENCOUNTER — Encounter
Payer: No Typology Code available for payment source | Attending: Psychology | Admitting: Physical Medicine & Rehabilitation

## 2022-02-22 VITALS — BP 126/81 | HR 81 | Ht 66.0 in | Wt 228.4 lb

## 2022-02-22 DIAGNOSIS — F419 Anxiety disorder, unspecified: Secondary | ICD-10-CM | POA: Insufficient documentation

## 2022-02-22 DIAGNOSIS — F32A Depression, unspecified: Secondary | ICD-10-CM | POA: Insufficient documentation

## 2022-02-22 DIAGNOSIS — F329 Major depressive disorder, single episode, unspecified: Secondary | ICD-10-CM | POA: Insufficient documentation

## 2022-02-22 DIAGNOSIS — G479 Sleep disorder, unspecified: Secondary | ICD-10-CM | POA: Diagnosis not present

## 2022-02-22 DIAGNOSIS — R110 Nausea: Secondary | ICD-10-CM | POA: Diagnosis not present

## 2022-02-22 DIAGNOSIS — F0781 Postconcussional syndrome: Secondary | ICD-10-CM | POA: Insufficient documentation

## 2022-02-22 MED ORDER — QUETIAPINE FUMARATE 50 MG PO TABS: 50.0000 mg | ORAL_TABLET | Freq: Every day | ORAL | 3 refills | Status: DC

## 2022-02-22 MED ORDER — ESCITALOPRAM OXALATE 10 MG PO TABS: 10.0000 mg | ORAL_TABLET | Freq: Every day | ORAL | 3 refills | Status: DC

## 2022-02-22 NOTE — Patient Instructions (Signed)
CO-Q10 VITAMIN B COMPLEX GINSENG/GINGKO

## 2022-02-22 NOTE — Progress Notes (Signed)
Subjective:    Patient ID: Craig Hart, male    DOB: March 18, 1956, 66 y.o.   MRN: [id number]  HPI Craig Hart is here in follow up of his PCS.  He feels that the movement in his right arm has improved but he still is having pain and limitations in his left arm and neck. Dr. Luiz Blare follows him and deems him capable of only sedentary work.   He is involved in visual therapies at [facility]. They have identified some peripheral field loss and  have prescribed corrective lenses but hasn't received them yet d/t some payment issues. He feels that his depth perception is better although he still has to be careful walking down steps.[name] He is still limited by nauseas  Today he is nauseas and photosensitive. He awoke feeling this way. His sleep has been better with seroquel but is not always consistent. His mood is still depressed but I learned that he hasn't been using lexapro consistently this summer  He completed a sleep study earlier this week.      Pain Inventory Average Pain 7 Pain Right Now 7 My pain is aching  LOCATION OF PAIN  head, neck, shoulder  BOWEL Number of stools per week: 7 Oral laxative use No  Type of laxative . Enema or suppository use No  History of colostomy No  Incontinent No   BLADDER Normal In and out cath, frequency . Able to self cath  . Bladder incontinence No  Frequent urination No  Leakage with coughing No  Difficulty starting stream No  Incomplete bladder emptying No    Mobility ability to climb steps?  no do you drive?  yes  Function employed # of hrs/week [employer] what is your job? [employer] I need assistance with the following:  dressing, meal prep, household duties, and shopping  Neuro/Psych trouble walking dizziness depression anxiety  Prior Studies Any changes since last visit?  no  Physicians involved in your care Any changes since last visit?  no   Family History  Problem Relation Age of Onset   Hypertension Mother     Diabetes Mother    Hypertension Father    Social History   Socioeconomic History   Marital status: Married    Spouse name: Not on file   Number of children: Not on file   Years of education: Not on file   Highest education level: Not on file  Occupational History   Not on file  Tobacco Use   Smoking status: Never    Passive exposure: Never   Smokeless tobacco: Never  Vaping Use   Vaping Use: Never used  Substance and Sexual Activity   Alcohol use: No   Drug use: No   Sexual activity: Yes  Other Topics Concern   Not on file  Social History Narrative   Not on file   Social Determinants of Health   Financial Resource Strain: Not on file  Food Insecurity: Not on file  Transportation Needs: Not on file  Physical Activity: Not on file  Stress: Not on file  Social Connections: Not on file   Past Surgical History:  Procedure Laterality Date   LEFT HEART CATHETERIZATION WITH CORONARY ANGIOGRAM N/A 09/21/2014   Procedure: LEFT HEART CATHETERIZATION WITH CORONARY ANGIOGRAM;  Surgeon: Tonny Bollman, MD;  Location: [facility] CATH LAB;  Service: Cardiovascular:  minimal LAD disease.  20-30% mid RCA disease with a right dominant system.  EF 65-70%. --False activation of ST elevation MI.   SHOULDER  SURGERY Right 06/29/2021   TONSILLECTOMY     Past Medical History:  Diagnosis Date   Hypertension    Hypertensive emergency 09/21/2014   BP 126/81   Pulse 81   Ht 5\' 6"  (1.676 m)   Wt 228 lb 6.4 oz (103.6 kg)   SpO2 96%   BMI 36.86 kg/m   Opioid Risk Score:   Fall Risk Score:  `1  Depression screen PHQ 2/9     08/17/2021    1:39 PM 05/18/2021    1:10 PM 03/10/2021    9:15 AM 02/03/2021    9:34 AM 08/05/2020   10:55 AM 06/28/2020    3:09 PM 05/06/2020    3:24 PM  Depression screen PHQ 2/9  Decreased Interest 3 1 0 0 0 1 0  Down, Depressed, Hopeless 2 1 0 0 0 1 0  PHQ - 2 Score 5 2 0 0 0 2 0     Review of Systems  Musculoskeletal:  Positive for gait problem.   Neurological:  Positive for dizziness.  Psychiatric/Behavioral:  Positive for dysphoric mood. The patient is nervous/anxious.   All other systems reviewed and are negative.     Objective:   Physical Exam  General: No acute distress HEENT: NCAT, EOMI, oral membranes moist Cards: reg rate  Chest: normal effort Abdomen: Soft, NT, ND Skin: dry, intact Extremities: no edema Psych: pleasant and appropriate, in much better spirits  Neuro: Follows basic commands. Much more on point. More alert and attentive.  visual confrontation better but still triggers uncomfortable sensation.   Balance was good when he takes extra time. .  Romberg test still+ positive.  strength is 4+ to 5 out of 5 in lower extremities.  Right upper extremity 4+ to 5 out of 5.  Left upper extremity 4 - to 4 out of 5 proximal distal.  Since pain and light touch in all 4. Musculoskeletal: Pain along posterior neck and upper shoulders.         Assessment & Plan:    1. Post concussive syndrome - predominantly with nausea >>> headaches, balance, mood disturbance (loss of interest/frustration per wife), also with cognitive slowing and irritability. MRI report of brain was relatively unremarkable for head injury            -holding on outpt vestibular therapies as he's in neuro-eye treatments            -continue with Neuro-optometrist ([facility]) for vision therapy/vision assessment-              -prn zofran and meclizine                          2. Sleep disturbance: An ongoing problem             -seroquel --increase to 50mg  nightly             -MUCH IMPROVED             -consistent bedtime and improved sleep hygiene  has made a difference                        3. Myalgia                    ROM/ HEP   4. Cervical myelopathy             09/21/20 to C3-4 discectomy with fusion  5. Irritability/depression              -increase lexapro to 10mg  qhs---needs to use daily!              -increase Seroquel to 50mg  qhs for sleep and to decrease mood lability  -maintain Dr. follow up.  -discussed importance of improving these areas  6. Left shoulder: per Dr. improved but still not at baseline             -shoulder injections per his office             -needs to continue therapies 7. Left ear pain/?tinnitus     .    25 minutes of face to face patient care time were spent during this visit. All questions were encouraged and answered. Info was reviewed with CM also.  Follow up with me in 2 mos .

## 2022-02-23 ENCOUNTER — Telehealth: Payer: Self-pay

## 2022-02-23 NOTE — Telephone Encounter (Signed)
Prior Authorization for [employer] 4 MG faxed to Asencion Partridge worker's comp case Production designer, theatre/television/film. Faxed confirmed.

## 2022-05-31 ENCOUNTER — Encounter: Payer: Self-pay | Admitting: Physical Medicine & Rehabilitation

## 2022-05-31 ENCOUNTER — Encounter
Payer: No Typology Code available for payment source | Attending: Physical Medicine & Rehabilitation | Admitting: Physical Medicine & Rehabilitation

## 2022-05-31 VITALS — BP 132/81 | HR 85 | Ht 71.0 in | Wt 233.0 lb

## 2022-05-31 DIAGNOSIS — M47812 Spondylosis without myelopathy or radiculopathy, cervical region: Secondary | ICD-10-CM | POA: Insufficient documentation

## 2022-05-31 DIAGNOSIS — F0781 Postconcussional syndrome: Secondary | ICD-10-CM | POA: Insufficient documentation

## 2022-05-31 NOTE — Patient Instructions (Signed)
ALWAYS FEEL FREE TO CALL OUR OFFICE WITH ANY PROBLEMS OR QUESTIONS ([phone])  **PLEASE NOTE** ALL MEDICATION REFILL REQUESTS (INCLUDING CONTROLLED SUBSTANCES) NEED TO BE MADE AT LEAST 7 DAYS PRIOR TO REFILL BEING DUE. ANY REFILL REQUESTS INSIDE THAT TIME FRAME MAY RESULT IN DELAYS IN RECEIVING YOUR PRESCRIPTION.                    

## 2022-05-31 NOTE — Progress Notes (Signed)
Subjective:    Patient ID: Craig Hart, male    DOB: [birth year], 66 y.o.   MRN: [id number]  HPI Craig Hart is here in follow up of his PCS. He is sleeping much better with increase to 50mg . He's sleeping through the night. His mood has improved also with sleep and lexapro increase. He is much more engaged in his own self-care, well being, meds, etc. He has been getting out of the house more. He walks a lot with his wife.   He continues to work in visual therapy which is improving his peripheral vision especially on the right side. His balance and equilibrium have improved also. He has more work to do.   He completed therapy for his left shoulder but is still having pain. Dr. continues to follow him for this.      Pain Inventory Average Pain 8 Pain Right Now 4 My pain is constant, sharp, dull, and tingling  LOCATION OF PAIN  head, neck, shoulder  BOWEL Number of stools per week: 6  BLADDER Normal   Mobility walk without assistance ability to climb steps?  yes do you drive?  yes  Function disabled: date disabled worker comp I need assistance with the following:  dressing, bathing, meal prep, household duties, and shopping Do you have any goals in this area?  yes  Neuro/Psych weakness numbness tingling dizziness confusion anxiety  Prior Studies Any changes since last visit?  no  Physicians involved in your care Any changes since last visit?  no   Family History  Problem Relation Age of Onset   Hypertension Mother    Diabetes Mother    Hypertension Father    Social History   Socioeconomic History   Marital status: Married    Spouse name: Not on file   Number of children: Not on file   Years of education: Not on file   Highest education level: Not on file  Occupational History   Not on file  Tobacco Use   Smoking status: Never    Passive exposure: Never   Smokeless tobacco: Never  Vaping Use   Vaping Use: Never used  Substance and  Sexual Activity   Alcohol use: No   Drug use: No   Sexual activity: Yes  Other Topics Concern   Not on file  Social History Narrative   Not on file   Social Determinants of Health   Financial Resource Strain: Not on file  Food Insecurity: Not on file  Transportation Needs: Not on file  Physical Activity: Not on file  Stress: Not on file  Social Connections: Not on file   Past Surgical History:  Procedure Laterality Date   LEFT HEART CATHETERIZATION WITH CORONARY ANGIOGRAM N/A 09/21/2014   Procedure: LEFT HEART CATHETERIZATION WITH CORONARY ANGIOGRAM;  Surgeon: 11/21/2014, MD;  Location: [facility] CATH LAB;  Service: Cardiovascular:  minimal LAD disease.  20-30% mid RCA disease with a right dominant system.  EF 65-70%. --False activation of ST elevation MI.   SHOULDER SURGERY Right 06/29/2021   TONSILLECTOMY     Past Medical History:  Diagnosis Date   Hypertension    Hypertensive emergency 09/21/2014   Ht 5\' 6"  (1.676 m)   Wt 233 lb (105.7 kg)   BMI 37.61 kg/m   Opioid Risk Score:   Fall Risk Score:  `1  Depression screen [facility] 2/9     08/17/2021    1:39 PM 05/18/2021    1:10 PM 03/10/2021    9:15  AM 02/03/2021    9:34 AM 08/05/2020   10:55 AM 06/28/2020    3:09 PM 05/06/2020    3:24 PM  Depression screen PHQ 2/9  Decreased Interest 3 1 0 0 0 1 0  Down, Depressed, Hopeless 2 1 0 0 0 1 0  PHQ - 2 Score 5 2 0 0 0 2 0    Review of Systems  Musculoskeletal:        Tingling  Neurological:  Positive for dizziness, weakness and numbness.       Tingling  Psychiatric/Behavioral:  Positive for confusion.        Anxiety  All other systems reviewed and are negative.     Objective:   Physical Exam   General: No acute distress HEENT: NCAT, EOMI, oral membranes moist Cards: reg rate  Chest: normal effort Abdomen: Soft, NT, ND Skin: dry, intact Extremities: no edema Psych: pleasant and appropriate, smiling, very engaging.   Neuro: alert and attentive. Occ short term  memory issues.  visual confrontation without any symptoms today. No nystagmus   Balance improved.  strength is 4+ to 5 out of 5 in lower extremities.  Right upper extremity 4+ to 5 out of 5.  Left upper extremity 4 - to 4 out of 5 proximal distal.  Since pain and light touch in all 4. Musculoskeletal: left shoulder abd to 90 degrees.          Assessment & Plan:    1. Post concussive syndrome - predominantly with nausea >>> headaches, balance, mood disturbance (loss of interest/frustration per wife), also with cognitive slowing and irritability. MRI report of brain was relatively unremarkable for head injury            -continue with Neuro-optometrist ([facility]) for vision therapy/vision assessment-              -prn zofran and meclizine for symptomatic relief             -continued aerobic exercise via HEP           2. Sleep disturbance:               -seroquel -continue 50mg  nightly             -MUCH IMPROVED!             -consistent bedtime and improved sleep hygiene discussed                       3. Myalgia                    ROM/ HEP   4. Cervical myelopathy             09/21/20 to C3-4 discectomy with fusion                5. Irritability/depression              -maintain lexapro to 10mg  qhs              -  Seroquel  50mg  qhs for sleep and to decrease mood lability             -maintain Dr. 11/21/20 follow up.             -exercise, socialization  6. Left shoulder: per Dr. improved but still not at baseline             -shoulder injections per his office             -  needs to continue therapies 7. Left ear pain/?tinnitus       30 minutes of face to face patient care time were spent during this visit. All questions were encouraged and answered. Reviewed with WC case Production designer, theatre/television/film. Follow up with me in 3 mos .

## 2022-06-21 ENCOUNTER — Encounter: Payer: No Typology Code available for payment source | Attending: Psychology | Admitting: Psychology

## 2022-06-21 DIAGNOSIS — F0781 Postconcussional syndrome: Secondary | ICD-10-CM

## 2022-06-21 DIAGNOSIS — F329 Major depressive disorder, single episode, unspecified: Secondary | ICD-10-CM | POA: Diagnosis present

## 2022-06-21 DIAGNOSIS — G479 Sleep disorder, unspecified: Secondary | ICD-10-CM | POA: Diagnosis present

## 2022-06-25 ENCOUNTER — Encounter: Payer: Self-pay | Admitting: Psychology

## 2022-06-25 NOTE — Progress Notes (Signed)
Neuropsychology Visit  Patient:  Craig Hart   DOB: [birth year]  MR Number: [id number]  Location: [facility] PHYSICAL MEDICINE AND REHABILITATION [address] [place], [address] [id number] [facility] [place] [address] Dept: [phone]  Date of Service: 06/21/2022  Start: 11 AM End: 12 PM  Today's visit was an in person visit as conducted in my outpatient clinic office.  The patient, his wife myself were present.  In the last 15 minutes of the appointment the patient's case manager also was brought into the visit and we reviewed some of the goals and plans going forward.  Duration of Service: 1 Hour  Provider/Observer:     Hershal Coria PsyD  Chief Complaint:      Chief Complaint  Patient presents with   Depression   Sleeping Problem   Memory Loss   Other    Reason For Service:     Craig Hart is a 67 year old male referred for neuropsychological consultation and therapeutic interventions by Maryla Morrow, MD for neuropsychological consultation due to ongoing and residual effects of a concussive event with ongoing issues of anxiety, depression, sleep disturbance, lack of motivation, flatness of affect, attention concentration deficits and depression.  Patient is struggled with daily nausea as well as difficulty with focus and balance.  Significant sleep disturbance continues with multiple efforts to gain better circadian rhythm patterns.  The patient is now being followed by Faith Rogue, MD for ongoing physiatry care and the patient has therapies through outpatient rehab.  The patient had a work-related injury in April 2021.  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  There was an equipment failure and the metal apparatus holding the fuel nozzle broke from above striking him in the head.  At the time he denied a full loss of  consciousness and was struck in the left ear with immediate pain.  There is also no retrograde or anterograde amnesia reported.  During the clinical interview greater detail was provided by the patient and his wife regarding the accident with his wife helping to fill-in as the patient has some difficulty with recall of all the details.  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  A bar holding the fuel line broke and a pipe swung around hitting the patient his head.  The patient reports that he did not initially realize what it happened for the first couple of seconds but reports that he was stunned from this.  The patient reports that he had to struggle to some degree getting down off of the truck.  He told his supervisor and filled out an accident report.  The patient reports that he had to sit down initially during this time to get his thoughts together but later drove himself to urgent care.  Patient's wife reports that she was told that he was bleeding from his left ear and left arm.  Patient was told after leaving the initial urgent care encounter that if he had further problems to come back for review.  The patient returned on the following Monday with significant headache sore ear but still denying any cognitive symptoms or visual disturbance.  He did describe jerking his head trying to get away from it but got it anyway.  The patient described having significant difficulties with his neck and  that it was very painful.  He reported that he could hardly move his head.  He also reported difficulty getting out of bed and not being able to sleep.  He felt that his level of pain was causing him to have nausea.  He denied any prior neck problems or neck difficulties.  Patient return for the third urgent care visit 4 days later (10/17/2019) stating that he was no better and he still had significant neck pain and still having significant headaches and  nausea.  Patient was describing having significant difficulty getting enough sleep after the injury and that he was having daily headaches and daily nausea.  He denied any significant problems with cognition or visual symptoms.  He also denied problems with strength or balance.  At the time he also is reported to have denied previous issues with headaches but earlier medical records do suggest at least a previous episode of significant headache as a CT scan of his head without contrast was performed back in November 2014 with clinical data described as for headache and hypertension.  The CT in 2014 found no acute intracranial abnormality and no indication of acute process.  The patient has continued to have significant difficulties with headache, sleep disturbance, nausea, tingling in his left arm with numbness, and has had cervical surgery through neurosurgery as well as orthopedic surgery on his left shoulder through Dr. Berenice Primas orthopedics.  The patient is also participated in physical therapy previously where they tried traction initially but ultimately required surgery with 2 level neck fusion.  The patient is still having issues consistent with concussive type symptoms including ongoing nausea, headaches and balance issues.  He saw Dr. Posey Pronto for some time who tried a number of different medications.  The patient has done both physical therapy has been well as vestibular therapies and also participated in speech and occupational therapies.  Patient and his wife reports that there has been a significant impact on the quality of his life and his difficulties of Him from working.  The patient describes significant sleep disturbance with very restless sleep.  The patient reports that he will take his medications and is able to go to sleep but then will sleep 2 to 3 hours straight and then wake up in the night and have difficulty going back to sleep.  The patient has regular nights with very limited and poor  sleep quality.  The patient describes memory difficulties and changes in short-term memory as well as attention and concentration weaknesses.  With standard questions about his sleep patterns I took the occasion to ask the patient and his wife about snoring and possible symptoms of obstructive sleep apnea.  The patient's wife reports that she did start noticing significant snoring and pauses during breathing while he was sleeping after his injury.  She reports that she was paying more close attention to him after his neck injury and during the times he was having neck difficulties and the patient was having very loud snoring with symptoms such as breaks in snoring patterns consistent with apneic events.  Patient has had an MRI brain conducted on 12/08/2019 that showed no acute process and essentially was a normal MRI with only mild small vessel ischemic changes consistent with age.  Treatment Interventions:  We continue to work on cognitive/behavioral therapeutic interventions including working on Radiographer, therapeutic around postconcussion syndrome and depressive type symptomatology.  We also focused on addressing significant sleep disturbance.  Participation Level:   Active  Participation Quality:  Appropriate      Behavioral Observation:  Well Groomed, Alert, and Appropriate.   Current Psychosocial Factors: The patient reports that he has made significant gains since our last visit.  He is doing much more and has improved his activity level and reports that his mood has been improved.  He was much more involved and engaged today than I had seen him in the past and he continues to show significant improvement around mood state.  The patient continues to have a great deal of anger and resentment around the fact that he was injured in the first place and we addressed these features as to how focusing on these issues can have a negative impact on his day-to-day function.  Content of Session:   Reviewed current  symptoms and continue to work on therapeutic interventions around residual effects of his postconcussion syndrome.  The patient has continued to struggle with engagement, attention and concentration difficulties and motivation.  Effectiveness of Interventions: The patient appears to be significantly improving.  He was more engaging during the visit today with brighter affect and confirmed similar changes in day-to-day activity by his wife.  He continues to have difficulty with sleep but has been actively working on sleep hygiene issues resulting in improvements in this aspect..  Target Goals:   The patient continues to struggle with attention and concentration difficulties and motivation with reactive depression symptoms.  Our target goals include increasing engagement in activities and working on increasing challenges for the patient as far as cognitive interactions.  Goals Last Reviewed:   06/21/2022  Goals Addressed Today:    We continue to work on therapeutic interventions around better management and coping with postconcussion syndrome symptoms, social engagement and activity levels at home.  Impression/Diagnosis:   Craig Hart is a 67 year old male referred for neuropsychological consultation and therapeutic interventions by Maryla Morrow, MD for neuropsychological consultation due to ongoing and residual effects of a concussive event with ongoing issues of anxiety, depression, sleep disturbance, lack of motivation, flatness of affect, attention concentration deficits and depression.  Patient is struggled with daily nausea as well as difficulty with focus and balance.  Significant sleep disturbance continues with multiple efforts to gain better circadian rhythm patterns.  The patient is now being followed by Faith Rogue, MD for ongoing physiatry care and the patient has therapies through outpatient rehab.  At this point, I do think that the patient has made improvements and we will look at  other possible interventions and discussed possible benefit down the road for ketamine infusion type treatments although at this point he is showing progress and I would like to see how his improvements continue going forward.  Diagnosis:   Reactive depression  Post concussive syndrome  Sleep disturbance    Arley Phenix, Psy.D. Clinical Psychologist Neuropsychologist

## 2022-07-26 ENCOUNTER — Encounter: Payer: No Typology Code available for payment source | Attending: Psychology | Admitting: Psychology

## 2022-07-26 ENCOUNTER — Encounter: Payer: Self-pay | Admitting: Psychology

## 2022-07-26 DIAGNOSIS — G479 Sleep disorder, unspecified: Secondary | ICD-10-CM

## 2022-07-26 DIAGNOSIS — F0781 Postconcussional syndrome: Secondary | ICD-10-CM | POA: Diagnosis not present

## 2022-07-26 DIAGNOSIS — F329 Major depressive disorder, single episode, unspecified: Secondary | ICD-10-CM | POA: Diagnosis not present

## 2022-07-26 NOTE — Progress Notes (Signed)
Neuropsychology Visit  Patient:  Craig Hart   DOB: 1955/12/30  MR Number: [id number]  Location: [place] PHYSICAL MEDICINE & REHABILITATION [place] [id number] [address] [place] [address] Dept: [phone]  Date of Service: 07/26/2022  Start: 11 AM End: 12 PM  Today's visit was an in person visit as conducted in my outpatient clinic office.  The patient, his wife myself were present.  In the last 15 minutes of the appointment the patient's case manager also was brought into the visit and we reviewed some of the goals and plans going forward.  Duration of Service: 1 Hour  Provider/Observer:     Edgardo Roys PsyD  Chief Complaint:      Chief Complaint  Patient presents with   Depression   Sleeping Problem   Memory Loss   Other    Reason For Service:     Craig Hart is a 67 year old male referred for neuropsychological consultation and therapeutic interventions by Delice Lesch, MD for neuropsychological consultation due to ongoing and residual effects of a concussive event with ongoing issues of anxiety, depression, sleep disturbance, lack of motivation, flatness of affect, attention concentration deficits and depression.  Patient is struggled with daily nausea as well as difficulty with focus and balance.  Significant sleep disturbance continues with multiple efforts to gain better circadian rhythm patterns.  The patient is now being followed by Alger Simons, MD for ongoing physiatry care and the patient has therapies through outpatient rehab.  The patient had a work-related injury in April 2021.  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  There was an equipment failure and the metal apparatus holding the fuel nozzle broke from above striking him in the head.  At the time he denied a full loss of consciousness  and was struck in the left ear with immediate pain.  There is also no retrograde or anterograde amnesia reported.  During the clinical interview greater detail was provided by the patient and his wife regarding the accident with his wife helping to fill-in as the patient has some difficulty with recall of all the details.  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  A bar holding the fuel line broke and a pipe swung around hitting the patient his head.  The patient reports that he did not initially realize what it happened for the first couple of seconds but reports that he was stunned from this.  The patient reports that he had to struggle to some degree getting down off of the truck.  He told his supervisor and filled out an accident report.  The patient reports that he had to sit down initially during this time to get his thoughts together but later drove himself to urgent care.  Patient's wife reports that she was told that he was bleeding from his left ear and left arm.  Patient was told after leaving the initial urgent care encounter that if he had further problems to come back for review.  The patient returned on the following Monday with significant headache sore ear but still denying any cognitive symptoms or visual disturbance.  He did describe jerking his head trying to get away from it but got it anyway.  The patient described having significant difficulties with his neck and  that it was very painful.  He reported that he could hardly move his head.  He also reported difficulty getting out of bed and not being able to sleep.  He felt that his level of pain was causing him to have nausea.  He denied any prior neck problems or neck difficulties.  Patient return for the third urgent care visit 4 days later (10/17/2019) stating that he was no better and he still had significant neck pain and still having significant headaches and nausea.   Patient was describing having significant difficulty getting enough sleep after the injury and that he was having daily headaches and daily nausea.  He denied any significant problems with cognition or visual symptoms.  He also denied problems with strength or balance.  At the time he also is reported to have denied previous issues with headaches but earlier medical records do suggest at least a previous episode of significant headache as a CT scan of his head without contrast was performed back in November 2014 with clinical data described as for headache and hypertension.  The CT in 2014 found no acute intracranial abnormality and no indication of acute process.  The patient has continued to have significant difficulties with headache, sleep disturbance, nausea, tingling in his left arm with numbness, and has had cervical surgery through neurosurgery as well as orthopedic surgery on his left shoulder through Dr. Berenice Primas orthopedics.  The patient is also participated in physical therapy previously where they tried traction initially but ultimately required surgery with 2 level neck fusion.  The patient is still having issues consistent with concussive type symptoms including ongoing nausea, headaches and balance issues.  He saw Dr. Posey Pronto for some time who tried a number of different medications.  The patient has done both physical therapy has been well as vestibular therapies and also participated in speech and occupational therapies.  Patient and his wife reports that there has been a significant impact on the quality of his life and his difficulties of Him from working.  The patient describes significant sleep disturbance with very restless sleep.  The patient reports that he will take his medications and is able to go to sleep but then will sleep 2 to 3 hours straight and then wake up in the night and have difficulty going back to sleep.  The patient has regular nights with very limited and poor sleep  quality.  The patient describes memory difficulties and changes in short-term memory as well as attention and concentration weaknesses.  With standard questions about his sleep patterns I took the occasion to ask the patient and his wife about snoring and possible symptoms of obstructive sleep apnea.  The patient's wife reports that she did start noticing significant snoring and pauses during breathing while he was sleeping after his injury.  She reports that she was paying more close attention to him after his neck injury and during the times he was having neck difficulties and the patient was having very loud snoring with symptoms such as breaks in snoring patterns consistent with apneic events.  Patient has had an MRI brain conducted on 12/08/2019 that showed no acute process and essentially was a normal MRI with only mild small vessel ischemic changes consistent with age.  Treatment Interventions:  Today, will continue to work on cognitive/behavioral psychotherapeutic interventions around coping skills adjusting to his postconcussion syndrome and some orthopedic injuries particularly pain in his shoulder.  The patient has been actively working on therapeutic strategies we have discussed  previously and reports that he has made significant gains in his mood state, his effort level to continue to remain engaged in life and his activity around home and outside of his home.  The patient's wife reports that she is seeing significant improvements in his mood state.  However, he continues to have some residual cognitive deficits consistent with postconcussion syndrome and continued chronic pain symptoms.  Participation Level:   Active  Participation Quality:  Appropriate      Behavioral Observation:  Well Groomed, Alert, and Appropriate.   Current Psychosocial Factors: The patient reports that while he has made significant gains over the past couple of months I discussed previous gains earlier the patient  reports that he is still struggling with being as active in his personal self-care is much as he could or should.  The patient is doing more for others at times and he is doing for himself and the patient's wife reports that she feels like he is overdoing his efforts and motivation for others at an impact towards himself.  We addressed this issue about developing balance and thoughtfulness in his care and recovery.  Content of Session:   Reviewed current symptoms and continue to work on therapeutic interventions around residual effects of his postconcussion syndrome.  The patient has continued to struggle with engagement, attention and concentration difficulties and motivation.  Effectiveness of Interventions: The patient appears to be significantly improving.  He was more engaging during the visit today with brighter affect and confirmed similar changes in day-to-day activity by his wife.  He continues to have difficulty with sleep but has been actively working on sleep hygiene issues resulting in improvements in this aspect..  Target Goals:   The patient continues to struggle with attention and concentration difficulties and motivation with reactive depression symptoms.  Our target goals include increasing engagement in activities and working on increasing challenges for the patient as far as cognitive interactions.  Goals Last Reviewed:   07/26/2022  Goals Addressed Today:    We continue to work on therapeutic interventions around better management and coping with postconcussion syndrome symptoms, social engagement and activity levels at home.  Impression/Diagnosis:   Craig Hart is a 67 year old male referred for neuropsychological consultation and therapeutic interventions by Delice Lesch, MD for neuropsychological consultation due to ongoing and residual effects of a concussive event with ongoing issues of anxiety, depression, sleep disturbance, lack of motivation, flatness of affect, attention  concentration deficits and depression.  Patient is struggled with daily nausea as well as difficulty with focus and balance.  Significant sleep disturbance continues with multiple efforts to gain better circadian rhythm patterns.  The patient is now being followed by Alger Simons, MD for ongoing physiatry care and the patient has therapies through outpatient rehab.  At this point, I do think that the patient has made improvements and we will look at other possible interventions and discussed possible benefit down the road for ketamine infusion type treatments although at this point he is showing progress and I would like to see how his improvements continue going forward.  Diagnosis:   Reactive depression  Post concussive syndrome  Sleep disturbance    Ilean Skill, Psy.D. Clinical Psychologist Neuropsychologist

## 2022-08-30 ENCOUNTER — Encounter
Payer: No Typology Code available for payment source | Attending: Physical Medicine & Rehabilitation | Admitting: Physical Medicine & Rehabilitation

## 2022-08-30 ENCOUNTER — Encounter: Payer: Self-pay | Admitting: Physical Medicine & Rehabilitation

## 2022-08-30 VITALS — BP 129/81 | HR 76 | Ht 71.0 in | Wt 231.6 lb

## 2022-08-30 DIAGNOSIS — F0781 Postconcussional syndrome: Secondary | ICD-10-CM | POA: Insufficient documentation

## 2022-08-30 DIAGNOSIS — G479 Sleep disorder, unspecified: Secondary | ICD-10-CM | POA: Diagnosis present

## 2022-08-30 DIAGNOSIS — F329 Major depressive disorder, single episode, unspecified: Secondary | ICD-10-CM | POA: Diagnosis present

## 2022-08-30 MED ORDER — ESCITALOPRAM OXALATE 20 MG PO TABS: 20.0000 mg | ORAL_TABLET | Freq: Every day | ORAL | 2 refills | Status: DC

## 2022-08-30 NOTE — Patient Instructions (Signed)
ALWAYS FEEL FREE TO CALL OUR OFFICE WITH ANY PROBLEMS OR QUESTIONS ([phone])  **PLEASE NOTE** ALL MEDICATION REFILL REQUESTS (INCLUDING CONTROLLED SUBSTANCES) NEED TO BE MADE AT LEAST 7 DAYS PRIOR TO REFILL BEING DUE. ANY REFILL REQUESTS INSIDE THAT TIME FRAME MAY RESULT IN DELAYS IN RECEIVING YOUR PRESCRIPTION.                    

## 2022-08-30 NOTE — Progress Notes (Signed)
Subjective:    Patient ID: Craig Hart, male    DOB: Feb 12, 1956, 67 y.o.   MRN: [id number]  HPI  Craig Hart is here in follow up of his PCS. He has made progress from a visual standpoint. He is more aware of his peripheral vision. His depth perception has improved slightly. He has found that his right eye is not moving in tandem like it should. He still has nausea with eye therapy but it's tolerable with zofran. He is not having much nausea outside therapy. His balance has improved overall although he still may stumble once in a while.   His sleep has remained fairly stable. He may have a day once or twice a week where he wakes up at 4 or 5 am. He typically goes to bed at 10pm and wakes at 7am. He takes '50mg'$  seroquel at night.   Mood has improved but he may have taken a little step back. . He has his moments where he is resentful about what has happened to him. He can feel depressed as well. He doesn't seen Dr. Sima Matas until October  Pt also has reported left hip pain when he walks. He is not doing stretching. The pain can radiate down the side of his left thigh. It worsens the longer he walks.   Pain Inventory Average Pain 7 Pain Right Now 6 My pain is sharp and stabbing  In the last 24 hours, has pain interfered with the following? General activity 1 Relation with others 1 Enjoyment of life 2 What TIME of day is your pain at its worst? morning , daytime, evening, and night Sleep (in general) Fair  Pain is worse with: bending and some activites Pain improves with: rest Relief from Meds: 5  Family History  Problem Relation Age of Onset   Hypertension Mother    Diabetes Mother    Hypertension Father    Social History   Socioeconomic History   Marital status: Married    Spouse name: Not on file   Number of children: Not on file   Years of education: Not on file   Highest education level: Not on file  Occupational History   Not on file  Tobacco Use   Smoking status:  Never    Passive exposure: Never   Smokeless tobacco: Never  Vaping Use   Vaping Use: Never used  Substance and Sexual Activity   Alcohol use: No   Drug use: No   Sexual activity: Yes  Other Topics Concern   Not on file  Social History Narrative   Not on file   Social Determinants of Health   Financial Resource Strain: Not on file  Food Insecurity: Not on file  Transportation Needs: Not on file  Physical Activity: Not on file  Stress: Not on file  Social Connections: Not on file   Past Surgical History:  Procedure Laterality Date   LEFT HEART CATHETERIZATION WITH CORONARY ANGIOGRAM N/A 09/21/2014   Procedure: LEFT HEART CATHETERIZATION WITH CORONARY ANGIOGRAM;  Surgeon: Sherren Mocha, MD;  Location: [facility] CATH LAB;  Service: Cardiovascular:  minimal LAD disease.  20-30% mid RCA disease with a right dominant system.  EF 65-70%. --False activation of ST elevation MI.   SHOULDER SURGERY Right 06/29/2021   TONSILLECTOMY     Past Surgical History:  Procedure Laterality Date   LEFT HEART CATHETERIZATION WITH CORONARY ANGIOGRAM N/A 09/21/2014   Procedure: LEFT HEART CATHETERIZATION WITH CORONARY ANGIOGRAM;  Surgeon: Sherren Mocha, MD;  Location: [facility] CATH  LAB;  Service: Cardiovascular:  minimal LAD disease.  20-30% mid RCA disease with a right dominant system.  EF 65-70%. --False activation of ST elevation MI.   SHOULDER SURGERY Right 06/29/2021   TONSILLECTOMY     Past Medical History:  Diagnosis Date   Hypertension    Hypertensive emergency 09/21/2014   BP 129/81   Pulse 76   Ht '5\' 11"'$  (1.803 m)   Wt 231 lb 9.6 oz (105.1 kg)   SpO2 98%   BMI 32.30 kg/m   Opioid Risk Score:   Fall Risk Score:  `1  Depression screen [facility] 2/9     05/31/2022   11:04 AM 08/17/2021    1:39 PM 05/18/2021    1:10 PM 03/10/2021    9:15 AM 02/03/2021    9:34 AM 08/05/2020   10:55 AM 06/28/2020    3:09 PM  Depression screen [facility] 2/9  Decreased Interest 0 3 1 0 0 0 1  Down, Depressed, Hopeless  2  1 0 0 0 1  PHQ - 2 Score 0 5 2 0 0 0 2     Review of Systems  Musculoskeletal:        Bilateral shoulder pain Right wrist pain Left pelvic pain   All other systems reviewed and are negative.     Objective:   Physical Exam  General: No acute distress HEENT: NCAT, EOMI, oral membranes moist Cards: reg rate  Chest: normal effort Abdomen: Soft, NT, ND Skin: dry, intact Extremities: no edema Psych: pleasant and appropriate  Neuro: alert and attentive. Occ short term memory issues.  visual confrontation without any symptoms today. No nystagmus appreciated today   Balance improved.  strength is 4+ to 5 out of 5 in lower extremities.  Right upper extremity 4+ to 5 out of 5.  Left upper extremity 4 - to 4 out of 5 proximal distal.  Since pain and light touch in all 4. Musculoskeletal: left shoulder abd to 90 degrees. pain at left greater troch. Cross legged maneuver +         Assessment & Plan:    1. Post concussive syndrome - predominantly with nausea >>> headaches, balance, mood disturbance (loss of interest/frustration per wife), also with cognitive slowing and irritability. MRI report of brain was relatively unremarkable for head injury            -continue with Neuro-optometrist ([facility]) for vision therapy/vision assessment- he's making progress with vision/depth perception and oculo-vestibular sx             -prn zofran and meclizine for symptomatic relief, meclizine prior to vision therapy.           2. Sleep disturbance:               -seroquel -continue '50mg'$  nightly             - IMPROVED! Has a night or two each week where he wakes up early but even then he's sleepin at least 6 hours.              -consistent bedtime and improved sleep hygiene                        3. Myalgia                    ROM/ HEP   4. Cervical myelopathy             09/21/20 to  C3-4 discectomy with fusion                5. Irritability/depression             - some increase  lately? - increase lexapro to '20mg'$  qhs             - Seroquel  '50mg'$  qhs for sleep and mood lability             -has Dr. Sima Matas follow up but not until October             -exercise, socialization should continue 6. Left shoulder: per Dr. Kathrin Greathouse improved but still not at baseline             -shoulder injections per his office             -needs to continue therapies 7. Left ear pain/?tinnitus   8. Left hip pain. Mild greater troch bursitis  -ice for pain, heat for exercising  -stretches were provided.      30 minutes of face to face patient care time were spent during this visit. All questions were encouraged and answered. Reviewed with WC case Freight forwarder. Follow up with me in 3 mos .

## 2022-09-01 ENCOUNTER — Other Ambulatory Visit: Payer: Self-pay | Admitting: Physical Medicine & Rehabilitation

## 2022-10-03 ENCOUNTER — Other Ambulatory Visit: Payer: Self-pay | Admitting: Physical Medicine & Rehabilitation

## 2022-10-03 DIAGNOSIS — G479 Sleep disorder, unspecified: Secondary | ICD-10-CM

## 2022-10-03 DIAGNOSIS — F0781 Postconcussional syndrome: Secondary | ICD-10-CM

## 2022-12-27 ENCOUNTER — Encounter
Payer: No Typology Code available for payment source | Attending: Physical Medicine & Rehabilitation | Admitting: Physical Medicine & Rehabilitation

## 2022-12-27 ENCOUNTER — Encounter: Payer: Self-pay | Admitting: Physical Medicine & Rehabilitation

## 2022-12-27 VITALS — BP 141/85 | HR 71 | Ht 71.0 in | Wt 224.0 lb

## 2022-12-27 DIAGNOSIS — F0781 Postconcussional syndrome: Secondary | ICD-10-CM | POA: Diagnosis present

## 2022-12-27 DIAGNOSIS — F419 Anxiety disorder, unspecified: Secondary | ICD-10-CM

## 2022-12-27 DIAGNOSIS — G479 Sleep disorder, unspecified: Secondary | ICD-10-CM | POA: Diagnosis present

## 2022-12-27 DIAGNOSIS — F32A Depression, unspecified: Secondary | ICD-10-CM

## 2022-12-27 NOTE — Progress Notes (Signed)
Subjective:    Patient ID: Craig Hart, male    DOB: Dec 25, 1955, 67 y.o.   MRN: [id number]  HPI  Craig Hart is here in follow up of his PCS. He has been getting dental work including extractions. His pain is already better in his mouth. He is waiting on dentures.   He does some work in the yard and around the house. His left shoulder can be limiting at times. Dr. Luiz Blare follows him for this.   From a vision/balance standpoint he has continued with visual therapy which has helped a lot with his balance and equilibrium. He is waiting on prism lenses which were prescribed to help with his peripheral vision on the right. He is waiting on [facility] approval for the glasses. He still has issues with balance when walking longer dx  Sleep and mood have maintained nicely. He  remains on lexapro and seroqel.   Pain Inventory Average Pain 5 Pain Right Now 4 My pain is intermittent, constant, tingling, and aching  LOCATION OF PAIN  shoulder & neck  BOWEL Number of stools per week: 7 Oral laxative use No   BLADDER Normal   Mobility walk without assistance ability to climb steps?  yes do you drive?  yes Do you have any goals in this area?  yes  Function employed # of hrs/week worker's comp I need assistance with the following:  no problem Do you have any goals in this area?  yes  Neuro/Psych weakness numbness tingling trouble walking anxiety loss of taste or smell  Prior Studies Any changes since last visit?  yes Bladder ultrasound by Dr. Earlene [facility]  Physicians involved in your care Any changes since last visit?  no   Family History  Problem Relation Age of Onset   Hypertension Mother    Diabetes Mother    Hypertension Father    Social History   Socioeconomic History   Marital status: Married    Spouse name: Not on file   Number of children: Not on file   Years of education: Not on file   Highest education level: Not on file  Occupational History   Not on  file  Tobacco Use   Smoking status: Never    Passive exposure: Never   Smokeless tobacco: Never  Vaping Use   Vaping Use: Never used  Substance and Sexual Activity   Alcohol use: No   Drug use: No   Sexual activity: Yes  Other Topics Concern   Not on file  Social History Narrative   Not on file   Social Determinants of Health   Financial Resource Strain: Not on file  Food Insecurity: Not on file  Transportation Needs: Not on file  Physical Activity: Not on file  Stress: Not on file  Social Connections: Not on file   Past Surgical History:  Procedure Laterality Date   LEFT HEART CATHETERIZATION WITH CORONARY ANGIOGRAM N/A 09/21/2014   Procedure: LEFT HEART CATHETERIZATION WITH CORONARY ANGIOGRAM;  Surgeon: Tonny Bollman, MD;  Location: [facility] CATH LAB;  Service: Cardiovascular:  minimal LAD disease.  20-30% mid RCA disease with a right dominant system.  EF 65-70%. --False activation of ST elevation MI.   SHOULDER SURGERY Right 06/29/2021   TONSILLECTOMY     Past Medical History:  Diagnosis Date   Hypertension    Hypertensive emergency 09/21/2014   Ht 5\' 11"  (1.803 m)   Wt 224 lb (101.6 kg)   BMI 31.24 kg/m   Opioid Risk Score:  Fall Risk Score:  `1  Depression screen [facility] 2/9     12/27/2022   10:38 AM 05/31/2022   11:04 AM 08/17/2021    1:39 PM 05/18/2021    1:10 PM 03/10/2021    9:15 AM 02/03/2021    9:34 AM 08/05/2020   10:55 AM  Depression screen [facility] 2/9  Decreased Interest 1 0 3 1 0 0 0  Down, Depressed, Hopeless 1  2 1  0 0 0  PHQ - 2 Score 2 0 5 2 0 0 0    Review of Systems  Neurological:  Positive for weakness and numbness.       Tingling, Foggy mind  Psychiatric/Behavioral:         Anxiety, loss of taste  All other systems reviewed and are negative.     Objective:   Physical Exam  General: No acute distress HEENT: NCAT, EOMI, oral membranes moist Cards: reg rate  Chest: normal effort Abdomen: Soft, NT, ND Skin: dry, intact Extremities: no  edema Psych: pleasant and appropriate  Neuro: alert and attentive. Occ short term memory issues.  visual confrontation without any symptoms today. No nystagmus appreciated today   Balance improved.  strength is 4+ to 5 out of 5 in lower extremities.  Right upper extremity 4+ to 5 out of 5.  Left upper extremity 4 - to 4 out of 5 proximal distal.  Since pain and light touch in all 4. Musculoskeletal: left shoulder abd to 90 degrees. pain at left greater troch. Cross legged maneuver +         Assessment & Plan:    1. Post concussive syndrome - predominantly with nausea >>> headaches, balance, mood disturbance (loss of interest/frustration per wife), also with cognitive slowing and irritability. MRI report of brain was relatively unremarkable for head injury            -continue with Neuro-optometrist ([facility]) for vision therapy/vision assessment- he's made nice progress!  -awaiting prism glasses             -prn zofran and meclizine for symptomatic relief, meclizine prior to vision therapy.   -HANDICAPPED PARKING PASS         2. Sleep disturbance:               -seroquel -continue 50mg  nightly             -consistent bedtime and improved sleep hygiene                        3. Myalgia                    ROM/ HEP   4. Cervical myelopathy             09/21/20 to C3-4 discectomy with fusion                5. Irritability/depression             - occasional waxing and waning.  -   lexapro 20mg  qhs             - Seroquel  50mg  qhs for sleep and mood lability             -continue Dr. Kieth Brightly follow up but not until October             -exercise, socialization should continue 6. Left shoulder: per Dr. Randa Lynn improved but still not at baseline             -?  repeat shoulder injections per his office             -needs to continue therapies 7. Left ear pain/?tinnitus   8. Left hip pain. Mild greater troch bursitis             -ice for pain, heat for  exercising             -stretches were provided.      20  minutes of face to face patient care time were spent during this visit. All questions were encouraged and answered. Reviewed with WC case Production designer, theatre/television/film. Follow up with me in 4 mos .

## 2022-12-27 NOTE — Patient Instructions (Addendum)
WHEN MOOD IS OFF  LOOK AT OUTSIDE CAUSES: FOR EXAMPLE-- SLEEP? SOCIAL-RELATE?, PAIN?   FIND THINGS WHICH CAN GIVE YOU PLEASURE OR TAKE YOUR MIND OFF THE THINGS WHICH FRUSTRATE YOU.  TALK ABOUT THE THINGS WHICH ARE BOTHERING YOU AS WELL.   KEEP A DAILY CALENDAR TO HELP WITH DAY TO DAY MEMORY!

## 2023-03-05 ENCOUNTER — Telehealth: Payer: Self-pay

## 2023-03-05 NOTE — Telephone Encounter (Signed)
PA for Quetiapine submitted

## 2023-03-06 NOTE — Telephone Encounter (Signed)
Workers comp

## 2023-04-18 ENCOUNTER — Encounter: Payer: No Typology Code available for payment source | Attending: Psychology | Admitting: Psychology

## 2023-04-18 DIAGNOSIS — F32A Depression, unspecified: Secondary | ICD-10-CM | POA: Insufficient documentation

## 2023-04-18 DIAGNOSIS — F419 Anxiety disorder, unspecified: Secondary | ICD-10-CM | POA: Insufficient documentation

## 2023-04-18 DIAGNOSIS — G479 Sleep disorder, unspecified: Secondary | ICD-10-CM | POA: Diagnosis not present

## 2023-04-18 DIAGNOSIS — F0781 Postconcussional syndrome: Secondary | ICD-10-CM | POA: Insufficient documentation

## 2023-05-02 ENCOUNTER — Encounter
Payer: No Typology Code available for payment source | Attending: Physical Medicine & Rehabilitation | Admitting: Physical Medicine & Rehabilitation

## 2023-05-02 ENCOUNTER — Encounter: Payer: Self-pay | Admitting: Physical Medicine & Rehabilitation

## 2023-05-02 DIAGNOSIS — F0781 Postconcussional syndrome: Secondary | ICD-10-CM | POA: Diagnosis present

## 2023-05-02 DIAGNOSIS — G479 Sleep disorder, unspecified: Secondary | ICD-10-CM

## 2023-05-02 MED ORDER — QUETIAPINE FUMARATE 50 MG PO TABS
50.0000 mg | ORAL_TABLET | Freq: Every day | ORAL | 7 refills | Status: AC
Start: 2023-05-02 — End: ?

## 2023-05-02 MED ORDER — MELOXICAM 15 MG PO TABS: 15.0000 mg | ORAL_TABLET | Freq: Every day | ORAL | 6 refills | Status: AC

## 2023-05-02 NOTE — Patient Instructions (Addendum)
ALWAYS FEEL FREE TO CALL OUR OFFICE WITH ANY PROBLEMS OR QUESTIONS [phone])  **PLEASE NOTE** ALL MEDICATION REFILL REQUESTS (INCLUDING CONTROLLED SUBSTANCES) NEED TO BE MADE AT LEAST 7 DAYS PRIOR TO REFILL BEING DUE. ANY REFILL REQUESTS INSIDE THAT TIME FRAME MAY RESULT IN DELAYS IN RECEIVING YOUR PRESCRIPTION.    FOR SLEEP: MELATONIN 3MG  AT NIGHT, MAY INCREASE TO 6MG  IF NEEDED. CONTINUE SEROQUEL 50MG  AT BEDTIME.

## 2023-05-02 NOTE — Progress Notes (Signed)
Subjective:    Patient ID: Craig Hart, male    DOB: March 18, 1956, 67 y.o.   MRN: [id number]  HPI  Craig Hart is here in follow up of his PCS. He has been doing fairly well. He just saw Dr. Kieth Brightly last month for compensatory strategies to cope with anxiety/compression. He often is upset that he's unable to do things like he used to do them. He is still coming to terms with what he's lost. Overall his mood is improved despite the ups and downs. He likes to walk, work in the yard as he tolerates. From a social standpoint, he is still not doing much although he went to a birthday party this past weekend and ha gone to a few social outings with his wife. Craig Hart   He just finished therapy at [facility] with the plan for cranio-sacral therapy as he is still having daily headaches, typically for an hour to two. If he rests or is distratcgted by something else they tend to improve.   He ran out of seroquel a month ago and he wasn't sure if it was working as well prior to running out. They haven't been able to get a refill.      Pain Inventory Average Pain 8 Pain Right Now 6 My pain is constant and sharp  LOCATION OF PAIN  head, left shoulder  BOWEL Number of stools per week: 7 Oral laxative use No  Type of laxative none Enema or suppository use No  History of colostomy No  Incontinent No   BLADDER Normal    Mobility walk without assistance ability to climb steps?  yes do you drive?  yes Do you have any goals in this area?  yes  Function employed # of hrs/week workers comp  Neuro/Psych weakness numbness tremor tingling confusion anxiety  Prior Studies Any changes since last visit?  no  Physicians involved in your care Any changes since last visit?  no   Family History  Problem Relation Age of Onset   Hypertension Mother    Diabetes Mother    Hypertension Father    Social History   Socioeconomic History   Marital status: Married    Spouse name: Not on  file   Number of children: Not on file   Years of education: Not on file   Highest education level: Not on file  Occupational History   Not on file  Tobacco Use   Smoking status: Never    Passive exposure: Never   Smokeless tobacco: Never  Vaping Use   Vaping status: Never Used  Substance and Sexual Activity   Alcohol use: No   Drug use: No   Sexual activity: Yes  Other Topics Concern   Not on file  Social History Narrative   Not on file   Social Determinants of Health   Financial Resource Strain: Not on file  Food Insecurity: Not on file  Transportation Needs: Not on file  Physical Activity: Not on file  Stress: Not on file  Social Connections: Unknown (10/28/2021)   Received from [facility]   Social Network    Social Network: Not on file   Past Surgical History:  Procedure Laterality Date   LEFT HEART CATHETERIZATION WITH CORONARY ANGIOGRAM N/A 09/21/2014   Procedure: LEFT HEART CATHETERIZATION WITH CORONARY ANGIOGRAM;  Surgeon: Tonny Bollman, MD;  Location: [facility] CATH LAB;  Service: Cardiovascular:  minimal LAD disease.  20-30% mid RCA disease with a right dominant system.  EF 65-70%. --  False activation of ST elevation MI.   SHOULDER SURGERY Right 06/29/2021   TONSILLECTOMY     Past Medical History:  Diagnosis Date   Hypertension    Hypertensive emergency 09/21/2014   There were no vitals taken for this visit.  Opioid Risk Score:   Fall Risk Score:  `1  Depression screen [facility] 2/9     12/27/2022   10:38 AM 05/31/2022   11:04 AM 08/17/2021    1:39 PM 05/18/2021    1:10 PM 03/10/2021    9:15 AM 02/03/2021    9:34 AM 08/05/2020   10:55 AM  Depression screen [facility] 2/9  Decreased Interest 1 0 3 1 0 0 0  Down, Depressed, Hopeless 1  2 1  0 0 0  PHQ - 2 Score 2 0 5 2 0 0 0    Review of Systems  Musculoskeletal:        Tingling  Neurological:  Positive for tremors, weakness and numbness.  Psychiatric/Behavioral:  Positive for confusion.         Anxiety  All other systems reviewed and are negative.      Objective:   Physical Exam General: No acute distress HEENT: NCAT, EOMI, oral membranes moist Cards: reg rate  Chest: normal effort Abdomen: Soft, NT, ND Skin: dry, intact Extremities: no edema Psych: pleasant and appropriate. Seems much more up beat Neuro: alert and attentive. Mild term memory issues.  visual confrontation without any symptoms today. No nystagmus appreciated today   Balance ibetter  strength is 4+ to 5 out of 5 in lower extremities.  Right upper extremity 4+ to 5 out of 5.  Left upper extremity 4 - to 4 out of 5 proximal distal.  Senses pain and light touch in all 4. Musculoskeletal: left shoulder abd to 90 degrees with some discomfort         Assessment & Plan:    1. Post concussive syndrome - predominantly with nausea >>> headaches, balance, mood disturbance (loss of interest/frustration per wife), also with cognitive slowing and irritability. MRI report of brain was relatively unremarkable for head injury            -continue with Neuro-optometrist ([facility]) for vision therapy/vision assessment- he's made nice progress!             -prism glasses             -prn zofran and meclizine for symptomatic relief, meclizine prior to vision therapy.   -craniosacral therapy for H/A     2. Sleep disturbance:               -resume seroquel -continue 50mg  nightly             - add melatonin 3-6mg  at bedtime 3. Myalgia                    ROM/ HEP   4. Cervical myelopathy             09/21/20 to C3-4 discectomy with fusion                5. Irritability/depression             - occasional waxing and waning.  -  lexapro 20mg  qhs             - Seroquel  50mg  qhs for sleep and mood lability             -continue Dr. Kieth Brightly follow up but not  until October             -continue social acclimation, exercise 6. Left shoulder: per Dr. Randa Lynn improved but still not at baseline              -?repeat shoulder injections per his office             -refilled meloxicam 15mg  daily 7. Left ear pain/?tinnitus   8. Left hip pain. Mild greater troch bursitis             -ice for pain, heat for exercising             -stretches were provided.  9. Headaches  -think sleep is playing a role  -C-S therapy ok to try  -consider aimovig trial   30  minutes of face to face patient care time were spent during this visit. All questions were encouraged and answered. Reviewed with WC case Production designer, theatre/television/film. Follow up with me in 4 mos .

## 2023-05-18 NOTE — Progress Notes (Signed)
Neuropsychology Visit  Patient:  Craig Hart   DOB: 04/14/1956  MR Number: [id number]  Location: [facility] OF [facility] [address] [place], [place] [address] [place] [address] Dept: [phone]  Date of Service: 04/18/2023  Start: 11 AM End: 12 PM  Today's visit was an in person visit as conducted in my outpatient clinic office.  The patient, his wife myself were present.  In the last 15 minutes of the appointment the patient's case manager also was brought into the visit and we reviewed some of the goals and plans going forward.  Duration of Service: 1 Hour  Provider/Observer:     Hershal Coria PsyD  Chief Complaint:      Chief Complaint  Patient presents with   Memory Loss   Depression   Anxiety   Sleeping Problem    Reason For Service:     Craig Hart is a 67 year old male referred for neuropsychological consultation and therapeutic interventions by Maryla Morrow, MD for neuropsychological consultation due to ongoing and residual effects of a concussive event with ongoing issues of anxiety, depression, sleep disturbance, lack of motivation, flatness of affect, attention concentration deficits and depression.  Patient is struggled with daily nausea as well as difficulty with focus and balance.  Significant sleep disturbance continues with multiple efforts to gain better circadian rhythm patterns.  The patient is now being followed by Faith Rogue, MD for ongoing physiatry care and the patient has therapies through outpatient rehab.  The patient had a work-related injury in April 2021.  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  There was an equipment failure and the metal apparatus holding the fuel nozzle broke from above striking him in the head.  At the time he denied a full loss of  consciousness and was struck in the left ear with immediate pain.  There is also no retrograde or anterograde amnesia reported.  During the clinical interview greater detail was provided by the patient and his wife regarding the accident with his wife helping to fill-in as the patient has some difficulty with recall of all the details.  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  A bar holding the fuel line broke and a pipe swung around hitting the patient his head.  The patient reports that he did not initially realize what it happened for the first couple of seconds but reports that he was stunned from this.  The patient reports that he had to struggle to some degree getting down off of the truck.  He told his supervisor and filled out an accident report.  The patient reports that he had to sit down initially during this time to get his thoughts together but later drove himself to urgent care.  Patient's wife reports that she was told that he was bleeding from his left ear and left arm.  Patient was told after leaving the initial urgent care encounter that if he had further problems to come back for review.  The patient returned on the following Monday with significant headache sore ear but still denying any cognitive symptoms or visual disturbance.  He did describe jerking his head trying to get away from it but got it anyway.  The patient  described having significant difficulties with his neck and that it was very painful.  He reported that he could hardly move his head.  He also reported difficulty getting out of bed and not being able to sleep.  He felt that his level of pain was causing him to have nausea.  He denied any prior neck problems or neck difficulties.  Patient return for the third urgent care visit 4 days later (10/17/2019) stating that he was no better and he still had significant neck pain and still having significant headaches and  nausea.  Patient was describing having significant difficulty getting enough sleep after the injury and that he was having daily headaches and daily nausea.  He denied any significant problems with cognition or visual symptoms.  He also denied problems with strength or balance.  At the time he also is reported to have denied previous issues with headaches but earlier medical records do suggest at least a previous episode of significant headache as a CT scan of his head without contrast was performed back in November 2014 with clinical data described as for headache and hypertension.  The CT in 2014 found no acute intracranial abnormality and no indication of acute process.  The patient has continued to have significant difficulties with headache, sleep disturbance, nausea, tingling in his left arm with numbness, and has had cervical surgery through neurosurgery as well as orthopedic surgery on his left shoulder through Dr. Luiz Blare orthopedics.  The patient is also participated in physical therapy previously where they tried traction initially but ultimately required surgery with 2 level neck fusion.  The patient is still having issues consistent with concussive type symptoms including ongoing nausea, headaches and balance issues.  He saw Dr. Allena Katz for some time who tried a number of different medications.  The patient has done both physical therapy has been well as vestibular therapies and also participated in speech and occupational therapies.  Patient and his wife reports that there has been a significant impact on the quality of his life and his difficulties of Him from working.  The patient describes significant sleep disturbance with very restless sleep.  The patient reports that he will take his medications and is able to go to sleep but then will sleep 2 to 3 hours straight and then wake up in the night and have difficulty going back to sleep.  The patient has regular nights with very limited and poor  sleep quality.  The patient describes memory difficulties and changes in short-term memory as well as attention and concentration weaknesses.  With standard questions about his sleep patterns I took the occasion to ask the patient and his wife about snoring and possible symptoms of obstructive sleep apnea.  The patient's wife reports that she did start noticing significant snoring and pauses during breathing while he was sleeping after his injury.  She reports that she was paying more close attention to him after his neck injury and during the times he was having neck difficulties and the patient was having very loud snoring with symptoms such as breaks in snoring patterns consistent with apneic events.  Patient has had an MRI brain conducted on 12/08/2019 that showed no acute process and essentially was a normal MRI with only mild small vessel ischemic changes consistent with age.  Treatment Interventions:  we continue to work on therapeutic interventions around residual effects of his postconcussion syndrome etc.  The patient has continued to try to expand activities and engagement and his wife reported  today that there have been some significant improvements in his activity level and the patient's depression and anxiety have improved along with these improved behavioral patterns.  Participation Level:   Active  Participation Quality:  Appropriate      Behavioral Observation:  Well Groomed, Alert, and Appropriate.   Current Psychosocial Factors: The patient has significantly increased his activities around both house as well as outside of the house and the patient's wife is noting visible evidence of improvements as far as his adjustment and mood status.  He has been sleeping better and actively working on dietary issues and other behavioral interventions we have engaged in.  Content of Session:   Reviewed current symptoms and continue to work on therapeutic interventions around residual effects of  his postconcussion syndrome.  The patient has continued to struggle with engagement, attention and concentration difficulties and motivation.  Effectiveness of Interventions: The patient appears to be significantly improving.  He was more engaging during the visit today with brighter affect and confirmed similar changes in day-to-day activity by his wife.  He continues to have difficulty with sleep but has been actively working on sleep hygiene issues resulting in improvements in this aspect..  Target Goals:   The patient continues to struggle with attention and concentration difficulties and motivation with reactive depression symptoms.  Our target goals include increasing engagement in activities and working on increasing challenges for the patient as far as cognitive interactions.  Goals Last Reviewed:   04/18/2023  Goals Addressed Today:    We continue to work on therapeutic interventions around better management and coping with postconcussion syndrome symptoms, social engagement and activity levels at home.  Impression/Diagnosis:   TREVIONE DARKO is a 67 year old male referred for neuropsychological consultation and therapeutic interventions by Maryla Morrow, MD for neuropsychological consultation due to ongoing and residual effects of a concussive event with ongoing issues of anxiety, depression, sleep disturbance, lack of motivation, flatness of affect, attention concentration deficits and depression.  Patient is struggled with daily nausea as well as difficulty with focus and balance.  Significant sleep disturbance continues with multiple efforts to gain better circadian rhythm patterns.  The patient is now being followed by Faith Rogue, MD for ongoing physiatry care and the patient has therapies through outpatient rehab.  At this point, I do think that the patient has made improvements and we will look at other possible interventions and discussed possible benefit down the road for ketamine  infusion type treatments although at this point he is showing progress and I would like to see how his improvements continue going forward.  Diagnosis:   Post concussive syndrome  Anxiety and depression  Sleep disturbance    Arley Phenix, Psy.D. Clinical Psychologist Neuropsychologist

## 2023-05-23 ENCOUNTER — Telehealth: Payer: Self-pay | Admitting: Specialist

## 2023-05-23 DIAGNOSIS — G44321 Chronic post-traumatic headache, intractable: Secondary | ICD-10-CM

## 2023-05-23 NOTE — Telephone Encounter (Signed)
Referral made for PT- craniosacral therapy. Please forward referral.   Thanks!

## 2023-05-23 NOTE — Telephone Encounter (Signed)
Case manager would like a referral for patient to attend craniosacral therapy. Please call her back at [phone] (or [phone] - I could not understand the message.) Shirlean Mylar, MHA, OT/L [phone]

## 2023-09-05 ENCOUNTER — Encounter: Payer: Self-pay | Admitting: Physical Medicine & Rehabilitation

## 2023-09-05 ENCOUNTER — Encounter
Payer: BC Managed Care – PPO | Attending: Physical Medicine & Rehabilitation | Admitting: Physical Medicine & Rehabilitation

## 2023-09-05 VITALS — BP 158/99 | HR 76 | Ht 71.0 in | Wt 221.2 lb

## 2023-09-05 DIAGNOSIS — G479 Sleep disorder, unspecified: Secondary | ICD-10-CM | POA: Insufficient documentation

## 2023-09-05 DIAGNOSIS — F419 Anxiety disorder, unspecified: Secondary | ICD-10-CM | POA: Diagnosis not present

## 2023-09-05 DIAGNOSIS — F32A Depression, unspecified: Secondary | ICD-10-CM | POA: Insufficient documentation

## 2023-09-05 DIAGNOSIS — G44321 Chronic post-traumatic headache, intractable: Secondary | ICD-10-CM | POA: Diagnosis not present

## 2023-09-05 DIAGNOSIS — F0781 Postconcussional syndrome: Secondary | ICD-10-CM | POA: Diagnosis present

## 2023-09-05 DIAGNOSIS — G44309 Post-traumatic headache, unspecified, not intractable: Secondary | ICD-10-CM | POA: Insufficient documentation

## 2023-09-05 MED ORDER — AIMOVIG 70 MG/ML ~~LOC~~ SOAJ
70.0000 mg | SUBCUTANEOUS | 3 refills | Status: DC
Start: 2023-09-05 — End: 2024-01-15

## 2023-09-05 NOTE — Progress Notes (Addendum)
 Subjective:    Patient ID: Craig Hart, male    DOB: 05/08/1956, 68 y.o.   MRN: [id number]  HPI  Craig Hart is here in follow up of his PCS/BI.  He went through carniosacral therapy and completed it about a month ago. He reports that the headache "moved" to another area but not is not as bad. It's less behind the eyes and more toward the back of the head where he says it doesn't bother him as much.    Since I last saw Craig Hart the patient he has developed some pain in his right foot, ?gout a few days. His shoulder still causes pain as well. He follows up with ortho soon. He is interested in stem cell patches.    From a standpoint of sleep, reasonable.  He's taking melatonin, seroquel   Emotionally the patient reports improvement. He ermains on lexapro   Cognitively, at baseline   In regard to social activity/reintegration his wife helps him integrate.      Pain Inventory Average Pain 6 Pain Right Now 7 My pain is intermittent, constant, sharp, and left shoulder  In the last 24 hours, has pain interfered with the following? General activity 9 Relation with others 3 Enjoyment of life 8 What TIME of day is your pain at its worst? night and varies Sleep (in general)  varies  Pain is worse with: some activites Pain improves with: rest, heat/ice, and pacing activities Relief from Meds: 5  Family History  Problem Relation Age of Onset   Hypertension Mother    Diabetes Mother    Hypertension Father    Social History   Socioeconomic History   Marital status: Married    Spouse name: Not on file   Number of children: Not on file   Years of education: Not on file   Highest education level: Not on file  Occupational History   Not on file  Tobacco Use   Smoking status: Never    Passive exposure: Never   Smokeless tobacco: Never  Vaping Use   Vaping status: Never Used  Substance and Sexual Activity   Alcohol use: No   Drug use: No   Sexual  activity: Yes  Other Topics Concern   Not on file  Social History Narrative   Not on file   Social Drivers of Health   Financial Resource Strain: Not on file  Food Insecurity: Not on file  Transportation Needs: Not on file  Physical Activity: Not on file  Stress: Not on file  Social Connections: Unknown (10/28/2021)   Received from [facility]   Social Network    Social Network: Not on file   Past Surgical History:  Procedure Laterality Date   LEFT HEART CATHETERIZATION WITH CORONARY ANGIOGRAM N/A 09/21/2014   Procedure: LEFT HEART CATHETERIZATION WITH CORONARY ANGIOGRAM;  Surgeon: Tonny Bollman, MD;  Location: [facility] CATH LAB;  Service: Cardiovascular:  minimal LAD disease.  20-30% mid RCA disease with a right dominant system.  EF 65-70%. --False activation of ST elevation MI.   SHOULDER SURGERY Right 06/29/2021   TONSILLECTOMY     Past Surgical History:  Procedure Laterality Date   LEFT HEART CATHETERIZATION WITH CORONARY ANGIOGRAM N/A 09/21/2014   Procedure: LEFT HEART CATHETERIZATION WITH CORONARY ANGIOGRAM;  Surgeon: Tonny Bollman, MD;  Location: [facility] CATH LAB;  Service: Cardiovascular:  minimal LAD disease.  20-30% mid RCA disease with a right dominant system.  EF 65-70%. --False activation of ST elevation  MI.   SHOULDER SURGERY Right 06/29/2021   TONSILLECTOMY     Past Medical History:  Diagnosis Date   Hypertension    Hypertensive emergency 09/21/2014   BP (!) 158/99   Pulse 76   Ht 5\' 11"  (1.803 m)   Wt 221 lb 3.2 oz (100.3 kg)   SpO2 98%   BMI 30.85 kg/m   Opioid Risk Score:   Fall Risk Score:  `1  Depression screen [facility] 2/9     09/05/2023   10:46 AM 05/02/2023   10:31 AM 12/27/2022   10:38 AM 05/31/2022   11:04 AM 08/17/2021    1:39 PM 05/18/2021    1:10 PM 03/10/2021    9:15 AM  Depression screen PHQ 2/9  Decreased Interest 0 0 1 0 3 1 0  Down, Depressed, Hopeless 0 0 1  2 1  0  PHQ - 2 Score 0 0 2 0 5 2 0      Review of Systems   Musculoskeletal:  Positive for myalgias.       Left shoulder  All other systems reviewed and are negative.      Objective:   Physical Exam  General: No acute distress HEENT: NCAT, EOMI, oral membranes moist Cards: reg rate  Chest: normal effort Abdomen: Soft, NT, ND Skin: dry, intact Extremities: no edema Psych: pleasant and appropriate  Neuro: alert and attentive. Mild term memory issues.  visual confrontation without any uncomfortable sx No nystagmus appreciated today  Balance ibetter  strength is 4+ to 5 out of 5 in lower extremities.  Right upper extremity 4+ to 5 out of 5.  Left upper extremity 4 - to 4 out of 5 proximal distal.  Senses pain and light touch in all 4. Musculoskeletal: left shoulder abd to 90 degrees with minimal discomfort         Assessment & Plan:    1. Post concussive syndrome - predominantly with nausea >>> headaches, balance, mood disturbance (loss of interest/frustration per wife), also with cognitive slowing and irritability. MRI report of brain was relatively unremarkable for head injury            -continue with Neuro-optometrist ([facility]) for vision therapy/vision assessment- he's made progress even with recent CS therapy             -prism glasses have helped with tinted lenses             -prn zofran and meclizine for symptomatic relief, meclizine prior to vision therapy.   -craniosacral therapy for H/A   with benefit   2. Sleep disturbance:               -resume seroquel -continue 50mg  nightly             - continue melatonin 3-6mg  at bedtime 3. Myalgia                    ROM/ HEP   4. Cervical myelopathy             09/21/20 to C3-4 discectomy with fusion                5. Irritability/depression             - occasional waxing and waning.  -  lexapro 20mg  qhs             - Seroquel  50mg  qhs for sleep and mood lability             -  continue Dr. Kieth Brightly f/u              -continue social acclimation, exercise as  possilbe 6. Left shoulder: per Dr. Randa Lynn improved but still not at baseline             -meloxicam 15mg  daily 7. Left ear pain/?tinnitus   8. Left hip pain. Mild greater troch bursitis--improved             -ice for pain, heat for exercising             -improved 9. Headaches             -improved.              -C-S beneficial             -would like to try aimovig --   30  minutes of face to face patient care time were spent during this visit. All questions were encouraged and answered. Reviewed with WC Case manager. Follow up with me in 4 mos .

## 2023-09-05 NOTE — Patient Instructions (Signed)
 ALWAYS FEEL FREE TO CALL OUR OFFICE WITH ANY PROBLEMS OR QUESTIONS [phone])  **PLEASE NOTE** ALL MEDICATION REFILL REQUESTS (INCLUDING CONTROLLED SUBSTANCES) NEED TO BE MADE AT LEAST 7 DAYS PRIOR TO REFILL BEING DUE. ANY REFILL REQUESTS INSIDE THAT TIME FRAME MAY RESULT IN DELAYS IN RECEIVING YOUR PRESCRIPTION.

## 2023-10-12 ENCOUNTER — Telehealth: Payer: Self-pay

## 2023-10-12 NOTE — Telephone Encounter (Signed)
 PA for Aimovig : Faxed to [employer] [facility], Drinda Gentry fax [phone], phone [phone].

## 2023-12-01 ENCOUNTER — Other Ambulatory Visit: Payer: Self-pay | Admitting: Physical Medicine & Rehabilitation

## 2023-12-01 DIAGNOSIS — G479 Sleep disorder, unspecified: Secondary | ICD-10-CM

## 2023-12-01 DIAGNOSIS — F0781 Postconcussional syndrome: Secondary | ICD-10-CM

## 2023-12-11 ENCOUNTER — Other Ambulatory Visit: Payer: Self-pay | Admitting: Physical Medicine & Rehabilitation

## 2023-12-11 DIAGNOSIS — F329 Major depressive disorder, single episode, unspecified: Secondary | ICD-10-CM

## 2023-12-11 DIAGNOSIS — G479 Sleep disorder, unspecified: Secondary | ICD-10-CM

## 2024-01-01 ENCOUNTER — Telehealth: Payer: Self-pay

## 2024-01-01 NOTE — Telephone Encounter (Signed)
 PA for Quetiapine  Fumarate 50 MG tab received-  Faxed to [employer] [facility], [employer] [phone], phone [phone]. Confirmation  received.

## 2024-01-02 ENCOUNTER — Encounter: Payer: Self-pay | Admitting: Physical Medicine & Rehabilitation

## 2024-01-02 ENCOUNTER — Encounter: Payer: Self-pay | Attending: Physical Medicine & Rehabilitation | Admitting: Physical Medicine & Rehabilitation

## 2024-01-02 VITALS — BP 136/88 | HR 63 | Ht 71.0 in | Wt 211.0 lb

## 2024-01-02 DIAGNOSIS — G44329 Chronic post-traumatic headache, not intractable: Secondary | ICD-10-CM | POA: Insufficient documentation

## 2024-01-02 DIAGNOSIS — F329 Major depressive disorder, single episode, unspecified: Secondary | ICD-10-CM | POA: Diagnosis not present

## 2024-01-02 DIAGNOSIS — F0781 Postconcussional syndrome: Secondary | ICD-10-CM | POA: Insufficient documentation

## 2024-01-02 DIAGNOSIS — R42 Dizziness and giddiness: Secondary | ICD-10-CM | POA: Insufficient documentation

## 2024-01-02 NOTE — Progress Notes (Signed)
 Subjective:    Patient ID: Craig Hart, male    DOB: 01-Dec-1955, 68 y.o.   MRN: [id number]  HPI  Craig Hart is here in follow up of his PCS and associated sx. He had good results with the left shoulder injection.    His glasses help with his depth perception. He sometimes forgets to wear them. He continues with HEP.  He completed his therapy at [facility] a few weeks ago.  He still does some of his exercises but probably not as much as he should be.  Aimovig  has helped his headaches immensely!  He's been on it for 3 mos. The medicine lasts for him the whole month. He is doing the injections himself!  He has had minimal nausea also which has been a bonus.  He denies any problems with constipation.  He's having some issues with the family which are causing stress.  He does find support in his wife and seems to be active in his church community as well.  He does realize that what happened to him was a life-changing thing.  He seems to have adjusted to it to a certain extent now.  He is sleeping well.  He remains on Seroquel  50 mg at bedtime but this can cause a bit of a hangover effect in the morning.  Remains on melatonin as well.    Pain Inventory Average Pain 1 Pain Right Now 2 My pain is intermittent, constant, sharp, stabbing, tingling, and aching  In the last 24 hours, has pain interfered with the following? General activity 1 Relation with others 1 Enjoyment of life 1 What TIME of day is your pain at its worst? evening Sleep (in general) Fair  Pain is worse with: sitting and some activites, light Pain improves with: rest, medication, and ice, massage Relief from Meds: 5  Family History  Problem Relation Age of Onset   Hypertension Mother    Diabetes Mother    Hypertension Father    Social History   Socioeconomic History   Marital status: Married    Spouse name: Not on file   Number of children: Not on file   Years of education: Not on file   Highest  education level: Not on file  Occupational History   Not on file  Tobacco Use   Smoking status: Never    Passive exposure: Never   Smokeless tobacco: Never  Vaping Use   Vaping status: Never Used  Substance and Sexual Activity   Alcohol use: No   Drug use: No   Sexual activity: Yes  Other Topics Concern   Not on file  Social History Narrative   Not on file   Social Drivers of Health   Financial Resource Strain: Not on file  Food Insecurity: Low Risk  (11/26/2023)   Received from [facility]   Hunger Vital Sign    Within the past 12 months, you worried that your food would run out before you got money to buy more: Never true    Within the past 12 months, the food you bought just didn't last and you didn't have money to get more. : Never true  Transportation Needs: No Transportation Needs (11/26/2023)   Received from [facility]    In the past 12 months, has lack of reliable transportation kept you from medical appointments, meetings, work or from getting things needed for daily living? : No  Physical Activity: Not on file  Stress: Not on file  Social Connections: Unknown (10/28/2021)   Received from [facility]   Social Network    Social Network: Not on file   Past Surgical History:  Procedure Laterality Date   LEFT HEART CATHETERIZATION WITH CORONARY ANGIOGRAM N/A 09/21/2014   Procedure: LEFT HEART CATHETERIZATION WITH CORONARY ANGIOGRAM;  Surgeon: Ozell Fell, MD;  Location: [facility] CATH LAB;  Service: Cardiovascular:  minimal LAD disease.  20-30% mid RCA disease with a right dominant system.  EF 65-70%. --False activation of ST elevation MI.   SHOULDER SURGERY Right 06/29/2021   TONSILLECTOMY     Past Surgical History:  Procedure Laterality Date   LEFT HEART CATHETERIZATION WITH CORONARY ANGIOGRAM N/A 09/21/2014   Procedure: LEFT HEART CATHETERIZATION WITH CORONARY ANGIOGRAM;  Surgeon: Ozell Fell, MD;  Location: [facility] CATH LAB;  Service:  Cardiovascular:  minimal LAD disease.  20-30% mid RCA disease with a right dominant system.  EF 65-70%. --False activation of ST elevation MI.   SHOULDER SURGERY Right 06/29/2021   TONSILLECTOMY     Past Medical History:  Diagnosis Date   Hypertension    Hypertensive emergency 09/21/2014   Ht 5' 11 (1.803 m)   Wt 211 lb (95.7 kg)   BMI 29.43 kg/m   Opioid Risk Score:   Fall Risk Score:  `1  Depression screen [facility] 2/9     01/02/2024   10:47 AM 09/05/2023   10:46 AM 05/02/2023   10:31 AM 12/27/2022   10:38 AM 05/31/2022   11:04 AM 08/17/2021    1:39 PM 05/18/2021    1:10 PM  Depression screen PHQ 2/9  Decreased Interest 0 0 0 1 0 3 1  Down, Depressed, Hopeless 0 0 0 1  2 1   PHQ - 2 Score 0 0 0 2 0 5 2    Review of Systems  Musculoskeletal:         Left leg pain  Neurological:  Positive for headaches.  All other systems reviewed and are negative.      Objective:   Physical Exam General: No acute distress HEENT: NCAT, EOMI, oral membranes moist Cards: reg rate  Chest: normal effort Abdomen: Soft, NT, ND Skin: dry, intact Extremities: no edema Psych: pleasant and appropriate  Neuro: alert and attentive. Mild term memory issues.  visual confrontation without any uncomfortable sx No nystagmus appreciated today  Balance ibetter  strength is 4+ to 5 out of 5 in lower extremities.  Right upper extremity 4+ to 5 out of 5.  Left upper extremity 4 - to 4 out of 5 proximal distal.  Senses pain and light touch in all 4.Prior neuro assessment is c/w 01/02/2024 exam.  Musculoskeletal: left shoulder with some pain WITH AROM.         Assessment & Plan:    1. Post concussive syndrome - predominantly with nausea >>> headaches, balance, mood disturbance (loss of interest/frustration per wife), also with cognitive slowing and irritability. MRI report of brain was relatively unremarkable for head injury            -completed  vision therapy - he's made progress with this.               -prism  glasses have helped with tinted lenses for headaches and balances             -prn zofran  and meclizine for symptomatic relief, meclizine prior to vision therapy.   -craniosacral therapy with benefit   2. Sleep disturbance:               -  seroquel  -continue 50mg  nightly             - continue melatonin 3-6mg  at bedtime 3. Myalgia                    ROM/ HEP   4. Cervical myelopathy             09/21/20 to C3-4 discectomy with fusion                5. Irritability/depression             - occasional waxing and waning.  -  lexapro  20mg  qhs             - reduce Seroquel   to 25mg  qhs for sleep and mood lability. May be able to stop completely             -continue Dr. Corina f/u              -maintain social acclimation, exercise as possilbe 6. Left shoulder: per Dr. Lysbeth improved but still not at baseline             -meloxicam  15mg  daily 7. Left ear pain/?tinnitus   8. Left hip:             -ice for pain, heat for exercising             -improved 9. Headaches             -improved with aimovig . Continue at 70mg  monthly. Bowels moving well.              -C-S beneficial               20  minutes of face to face patient care time were spent during this visit. All questions were encouraged and answered. Reviewed with WC Case manager. Follow up with me in 6 mos .

## 2024-01-02 NOTE — Patient Instructions (Signed)
 ALWAYS FEEL FREE TO CALL OUR OFFICE WITH ANY PROBLEMS OR QUESTIONS [phone])  **PLEASE NOTE** ALL MEDICATION REFILL REQUESTS (INCLUDING CONTROLLED SUBSTANCES) NEED TO BE MADE AT LEAST 7 DAYS PRIOR TO REFILL BEING DUE. ANY REFILL REQUESTS INSIDE THAT TIME FRAME MAY RESULT IN DELAYS IN RECEIVING YOUR PRESCRIPTION.

## 2024-01-11 ENCOUNTER — Other Ambulatory Visit: Payer: Self-pay | Admitting: Physical Medicine & Rehabilitation

## 2024-01-11 DIAGNOSIS — F0781 Postconcussional syndrome: Secondary | ICD-10-CM

## 2024-01-11 DIAGNOSIS — G44321 Chronic post-traumatic headache, intractable: Secondary | ICD-10-CM

## 2024-01-18 ENCOUNTER — Telehealth: Payer: Self-pay | Admitting: Physical Medicine & Rehabilitation

## 2024-01-18 DIAGNOSIS — F0781 Postconcussional syndrome: Secondary | ICD-10-CM

## 2024-01-18 DIAGNOSIS — F419 Anxiety disorder, unspecified: Secondary | ICD-10-CM

## 2024-01-18 DIAGNOSIS — G479 Sleep disorder, unspecified: Secondary | ICD-10-CM

## 2024-01-18 DIAGNOSIS — F32A Depression, unspecified: Secondary | ICD-10-CM

## 2024-01-18 NOTE — Telephone Encounter (Signed)
 Pt's wife Craig Hart calling to inform us  that his pharmacy [facility] - [place], [place] - [address] ) has not yet received an rx for Seroquel  25mg . Pt was seen on 01/02/24 for a follow up.

## 2024-01-28 MED ORDER — QUETIAPINE FUMARATE 25 MG PO TABS: 25.0000 mg | ORAL_TABLET | Freq: Every day | ORAL | 6 refills | Status: AC

## 2024-01-28 NOTE — Telephone Encounter (Signed)
 Order is in! Sorry for the delay

## 2024-01-30 ENCOUNTER — Encounter: Payer: BC Managed Care – PPO | Attending: Psychology | Admitting: Psychology

## 2024-01-30 ENCOUNTER — Encounter: Payer: Self-pay | Admitting: Psychology

## 2024-01-30 DIAGNOSIS — G479 Sleep disorder, unspecified: Secondary | ICD-10-CM | POA: Diagnosis not present

## 2024-01-30 DIAGNOSIS — F0781 Postconcussional syndrome: Secondary | ICD-10-CM | POA: Insufficient documentation

## 2024-01-30 DIAGNOSIS — F32A Depression, unspecified: Secondary | ICD-10-CM | POA: Diagnosis not present

## 2024-01-30 DIAGNOSIS — F419 Anxiety disorder, unspecified: Secondary | ICD-10-CM | POA: Diagnosis present

## 2024-01-30 DIAGNOSIS — G44329 Chronic post-traumatic headache, not intractable: Secondary | ICD-10-CM | POA: Diagnosis not present
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
## 2024-05-04 ENCOUNTER — Other Ambulatory Visit: Payer: Self-pay | Admitting: Physical Medicine & Rehabilitation

## 2024-05-04 DIAGNOSIS — G44321 Chronic post-traumatic headache, intractable: Secondary | ICD-10-CM

## 2024-05-04 DIAGNOSIS — F0781 Postconcussional syndrome: Secondary | ICD-10-CM

## 2024-05-29 ENCOUNTER — Other Ambulatory Visit: Payer: Self-pay | Admitting: Physical Medicine & Rehabilitation

## 2024-05-29 DIAGNOSIS — F0781 Postconcussional syndrome: Secondary | ICD-10-CM

## 2024-05-29 DIAGNOSIS — G479 Sleep disorder, unspecified: Secondary | ICD-10-CM

## 2024-06-04 ENCOUNTER — Other Ambulatory Visit: Payer: Self-pay | Admitting: Physical Medicine & Rehabilitation

## 2024-06-04 DIAGNOSIS — F0781 Postconcussional syndrome: Secondary | ICD-10-CM

## 2024-06-04 DIAGNOSIS — G44321 Chronic post-traumatic headache, intractable: Secondary | ICD-10-CM

## 2024-07-02 ENCOUNTER — Encounter: Payer: Self-pay | Admitting: Physical Medicine & Rehabilitation

## 2024-07-02 ENCOUNTER — Encounter: Payer: Self-pay | Attending: Physical Medicine & Rehabilitation | Admitting: Physical Medicine & Rehabilitation

## 2024-07-02 VITALS — BP 200/101 | HR 60 | Ht 71.0 in | Wt 208.6 lb

## 2024-07-02 DIAGNOSIS — G44321 Chronic post-traumatic headache, intractable: Secondary | ICD-10-CM | POA: Insufficient documentation

## 2024-07-02 DIAGNOSIS — G479 Sleep disorder, unspecified: Secondary | ICD-10-CM | POA: Diagnosis not present

## 2024-07-02 DIAGNOSIS — F0781 Postconcussional syndrome: Secondary | ICD-10-CM | POA: Diagnosis not present

## 2024-07-02 MED ORDER — AIMOVIG 70 MG/ML ~~LOC~~ SOAJ: 70.0000 mg | SUBCUTANEOUS | 6 refills | Status: AC

## 2024-07-02 NOTE — Progress Notes (Signed)
 "  Subjective:    Patient ID: Craig Hart, male    DOB: 12-16-1955, 69 y.o.   MRN: [id number]  HPI  Discussed the use of AI scribe software for clinical note transcription with the patient, who gave verbal consent to proceed.  History of Present Illness Craig Hart is a 69 year old male with postconcussional syndrome who presents for follow-up of persistent concussion-related symptoms.  Headache: - Persistent headaches with increased severity, particularly at night when lying down - Worsening of headache symptoms began two to three weeks after discontinuation of Aimovig  injections (ceased two months ago due to supply issues) - More pronounced headache symptoms by the fifth week off Aimovig  - Currently managed with oral medications - Attributes recent exacerbation to cessation of Aimovig   Dizziness and Balance Disturbance: - Intermittent dizziness and balance difficulties - Occasionally veers to the right when rising from a seated position - Wears glasses - Attempts home exercises as instructed, but adherence to prescribed vestibular and balance exercises has been inconsistent - Previous participation in therapy for vertigo, including exercises for eyes and ears  Sleep Disturbance: - Sleep described as pretty good but typically achieves only four hours per night - Worsening headaches may be contributing to sleep disturbance  Mood Disturbance: - Mood currently stable and described as good now - On a reduced dose of Seroquel , which is well tolerated - Follow-up psychiatric appointment scheduled  Auditory Symptoms: - Sister has expressed concern regarding possible hearing loss, as he sometimes does not respond to her - Unaware of any hearing deficit and attributes any issues to inattention or distraction - History of tinnitus and other ear symptoms - No formal audiology evaluation performed    Pain Inventory Average Pain 6 Pain Right Now 4 My pain is sharp and  aching  In the last 24 hours, has pain interfered with the following? General activity 6 Relation with others 6 Enjoyment of life 6 What TIME of day is your pain at its worst? morning , evening, and night Sleep (in general) NA  Pain is worse with: sitting and some activites Pain improves with: medication and injections Relief from Meds: 2  Family History  Problem Relation Age of Onset   Hypertension Mother    Diabetes Mother    Hypertension Father    Social History   Socioeconomic History   Marital status: Married    Spouse name: Not on file   Number of children: Not on file   Years of education: Not on file   Highest education level: Not on file  Occupational History   Not on file  Tobacco Use   Smoking status: Never    Passive exposure: Never   Smokeless tobacco: Never  Vaping Use   Vaping status: Never Used  Substance and Sexual Activity   Alcohol use: No   Drug use: No   Sexual activity: Yes  Other Topics Concern   Not on file  Social History Narrative   Not on file   Social Drivers of Health   Tobacco Use: Low Risk (07/02/2024)   Patient History    Smoking Tobacco Use: Never    Smokeless Tobacco Use: Never    Passive Exposure: Never  Financial Resource Strain: Not on file  Food Insecurity: Low Risk (11/26/2023)   Received from [facility]   Epic    Within the past 12 months, you worried that your food would run out before you got money to buy more: Never true  Within the past 12 months, the food you bought just didn't last and you didn't have money to get more. : Never true  Transportation Needs: No Transportation Needs (11/26/2023)   Received from [facility]    In the past 12 months, has lack of reliable transportation kept you from medical appointments, meetings, work or from getting things needed for daily living? : No  Physical Activity: Not on file  Stress: Not on file  Social Connections: Unknown (10/28/2021)   Received from  [facility]   Social Network    Social Network: Not on file  Depression (PHQ2-9): Low Risk (07/02/2024)   Depression (PHQ2-9)    PHQ-2 Score: 0  Alcohol Screen: Not on file  Housing: Low Risk (11/26/2023)   Received from [facility]   Epic    What is your living situation today?: I have a steady place to live    Think about the place you live. Do you have problems with any of the following? Choose all that apply:: None/None on this list  Utilities: Low Risk (11/26/2023)   Received from [facility]   Utilities    In the past 12 months has the electric, gas, oil, or water company threatened to shut off services in your home? : No  Health Literacy: Not on file   Past Surgical History:  Procedure Laterality Date   LEFT HEART CATHETERIZATION WITH CORONARY ANGIOGRAM N/A 09/21/2014   Procedure: LEFT HEART CATHETERIZATION WITH CORONARY ANGIOGRAM;  Surgeon: Ozell Fell, MD;  Location: [facility] C[facility] LAB;  Service: Cardiovascular:  minimal LAD disease.  20-30% mid RCA disease with a right dominant system.  EF 65-70%. --False activation of ST elevation MI.   SHOULDER SURGERY Right 06/29/2021   TONSILLECTOMY     Past Surgical History:  Procedure Laterality Date   LEFT HEART CATHETERIZATION WITH CORONARY ANGIOGRAM N/A 09/21/2014   Procedure: LEFT HEART CATHETERIZATION WITH CORONARY ANGIOGRAM;  Surgeon: Ozell Fell, MD;  Location: [facility] CATH LAB;  Service: Cardiovascular:  minimal LAD disease.  20-30% mid RCA disease with a right dominant system.  EF 65-70%. --False activation of ST elevation MI.   SHOULDER SURGERY Right 06/29/2021   TONSILLECTOMY     Past Medical History:  Diagnosis Date   Hypertension    Hypertensive emergency 09/21/2014   BP (!) 211/103   Pulse 60   Ht 5' 11 (1.803 m)   Wt 208 lb 9.6 oz (94.6 kg)   SpO2 97%   BMI 29.09 kg/m   Opioid Risk Score:   Fall Risk Score:  `1  Depression screen PHQ 2/9     07/02/2024    3:00 PM 01/02/2024   10:47 AM 09/05/2023   10:46 AM  05/02/2023   10:31 AM 12/27/2022   10:38 AM 05/31/2022   11:04 AM 08/17/2021    1:39 PM  Depression screen PHQ 2/9  Decreased Interest 0 0 0 0 1 0 3  Down, Depressed, Hopeless 0 0 0 0 1  2  PHQ - 2 Score 0 0 0 0 2 0 5     Review of Systems  Musculoskeletal:  Positive for neck pain.       Bilateral shoudler  All other systems reviewed and are negative.      Objective:   Physical Exam  General: No acute distress. Wearing shaded glasses HEENT: NCAT, EOMI, oral membranes moist Cards: reg rate  Chest: normal effort Abdomen: Soft, NT, ND Skin: dry, intact Extremities: no edema Psych: pleasant and  appropriate  Neuro: alert and attentive. Good insight. Normal hearing.  Mild term memory issues.  visual confrontation without any uncomfortable sx today.  No nystagmus appreciated today  Balance reasonable.  strength is 4+ to 5 out of 5 in lower extremities.  Right upper extremity 4+ to 5 out of 5.  Left upper extremity 4 - to 4 out of 5 proximal distal.  Senses pain and light touch in all 4.Prior neuro assessment is c/w 01/02/2024 exam.  Musculoskeletal: left shoulder still with some pain WITH AROM.         Assessment & Plan:    1. Post concussive syndrome - predominantly with nausea >>> headaches, balance, mood disturbance (loss of interest/frustration per wife), also with cognitive slowing and irritability. MRI report of brain was relatively unremarkable for head injury            -completed  vision therapy - he's made progress with this.              -prism  glasses have helped with tinted lenses for headaches and balances             -prn zofran  and meclizine for symptomatic relief, meclizine prior to vision therapy.   -craniosacral therapy with benefit   -NEEDS TO DO HEP!!! 2. Sleep disturbance:               -seroquel  -continue 25 mg nightly- at least into summer             - continue melatonin 3-6mg  at bedtime 3. Myalgia                    ROM/ HEP   4. Cervical myelopathy              09/21/20 to C3-4 discectomy with fusion                5. Irritability/depression             - occasional waxing and waning.  -  lexapro  20mg  qhs             - continue Seroquel   to 25mg  qhs for sleep and mood lability. May be able to stop completely at some point             -continue Dr. Corina f/u ---coming up             -maintain social acclimation, exercise as possilbe 6. Left shoulder: per Dr. Lysbeth improved but still not at baseline             -meloxicam  15mg  daily 7. Left ear pain/?tinnitus   8. Left hip:             -ice for pain, heat for exercising             -improved 9. Headaches             -  resume aimovig  at 70mg  monthly.!             -C-S therapy beneficial               20  minutes of face to face patient care time were spent during this visit. All questions were encouraged and answered. Reviewed with WC Case manager. Follow up with me in 6 mos .          "

## 2024-07-02 NOTE — Patient Instructions (Signed)
" °  VISIT SUMMARY: Craig Hart, a 69 year old male with postconcussional syndrome, visited for a follow-up on persistent concussion-related symptoms, including headaches, dizziness, balance issues, sleep disturbances, mood stability, and potential hearing concerns.  YOUR PLAN: -POST CONCUSSIVE SYNDROME: Post concussive syndrome refers to the lingering symptoms following a concussion, such as cognitive and attention deficits, dizziness, balance disturbance, and intermittent hearing concerns. You should continue with daily vestibular and eye/ear exercises. If hearing concerns persist, consider an audiology evaluation. We also provided guidance on managing attention and cognitive symptoms.  -CHRONIC POST-TRAUMATIC HEADACHE: Chronic post-traumatic headache is a persistent headache that occurs after a head injury. Your headaches have worsened since stopping Aimovig , so we have restarted Aimovig  injections to help manage your symptoms. We will periodically reassess the need for this medication and discuss potential tapering in the summer if your symptoms are controlled.  -DIZZINESS AND BALANCE DISTURBANCE: Dizziness and balance disturbance are common after a concussion and can worsen with fatigue or sudden movements. It is important to perform your daily vestibular and balance exercises consistently. On days when you feel more symptomatic, take extra time and perform additional exercises before getting up.  -SLEEP DISTURBANCE: Sleep disturbance can be caused by various factors, including headaches. Your sleep is currently limited to four hours nightly, which may be related to your headaches. Restarting Aimovig  should help manage your headaches and potentially improve your sleep.  -MOOD DISTURBANCE: Mood disturbance can occur after a concussion, but your mood is currently stable and well-managed with a reduced dose of Seroquel . We confirmed that your current dose is appropriate and reviewed your upcoming  psychiatric follow-up appointment on January 19th.  INSTRUCTIONS: Please continue with your daily vestibular and eye/ear exercises. Restart Aimovig  injections as prescribed. Take extra time and perform additional exercises before rising on symptomatic days. Follow up with your psychiatric appointment on January 19th. If hearing concerns persist, consider scheduling an audiology evaluation.   "

## 2024-07-16 ENCOUNTER — Telehealth: Payer: Self-pay

## 2024-07-16 NOTE — Telephone Encounter (Signed)
"   Faxed to [facility] fax [phone], phone [phone]. Confirmation  received.  "

## 2024-07-31 ENCOUNTER — Ambulatory Visit: Payer: Self-pay | Admitting: Psychology

## 2024-12-31 ENCOUNTER — Encounter: Admitting: Physical Medicine & Rehabilitation
